# Patient Record
Sex: Male | Born: 1950
Health system: Southern US, Community
[De-identification: ages and names within clinical notes are randomized; demographics above are authoritative.]

## PROBLEM LIST (undated history)

## (undated) DIAGNOSIS — K759 Inflammatory liver disease, unspecified: Secondary | ICD-10-CM

## (undated) DIAGNOSIS — M1990 Unspecified osteoarthritis, unspecified site: Secondary | ICD-10-CM

---

## 2015-06-02 ENCOUNTER — Other Ambulatory Visit (HOSPITAL_COMMUNITY): Payer: Self-pay | Admitting: Orthopaedic Surgery

## 2015-06-14 NOTE — Patient Instructions (Addendum)
Michael Noble  06/14/2015   Your procedure is scheduled on:   06/25/2015    Report to [facility] Main  Entrance take [facility]  elevators to 3rd floor to  Short Stay Center at      1230pm  Call this number if you have problems the morning of surgery [phone]   Remember: ONLY 1 PERSON MAY GO WITH YOU TO SHORT STAY TO GET  READY MORNING OF YOUR SURGERY.  Do not eat food after midnite.  May have clear liquids from 12 midnite until 0800am morning of surgery then nothing by mouth.      Take these medicines the morning of surgery with A SIP OF WATER: none                                You may not have any metal on your body including hair pins and              piercings  Do not wear jewelry, , lotions, powders or perfumes, deodorant          .              Men may shave face and neck.   Do not bring valuables to the hospital. [facility] IS NOT             RESPONSIBLE   FOR VALUABLES.  Contacts, dentures or bridgework may not be worn into surgery.  Leave suitcase in the car. After surgery it may be brought to your room.      Special Instructions: coughing and deep breathing exercises, leg exercises               Please read over the following fact sheets you were given: _____________________________________________________________________             [facility] - Preparing for Surgery Before surgery, you can play an important role.  Because skin is not sterile, your skin needs to be as free of germs as possible.  You can reduce the number of germs on your skin by washing with CHG (chlorahexidine gluconate) soap before surgery.  CHG is an antiseptic cleaner which kills germs and bonds with the skin to continue killing germs even after washing. Please DO NOT use if you have an allergy to CHG or antibacterial soaps.  If your skin becomes reddened/irritated stop using the CHG and inform your nurse when you arrive at Short Stay. Do not shave (including legs and  underarms) for at least 48 hours prior to the first CHG shower.  You may shave your face/neck. Please follow these instructions carefully:  1.  Shower with CHG Soap the night before surgery and the  morning of Surgery.  2.  If you choose to wash your hair, wash your hair first as usual with your  normal  shampoo.  3.  After you shampoo, rinse your hair and body thoroughly to remove the  shampoo.                           4.  Use CHG as you would any other liquid soap.  You can apply chg directly  to the skin and wash  Gently with a scrungie or clean washcloth.  5.  Apply the CHG Soap to your body ONLY FROM THE NECK DOWN.   Do not use on face/ open                           Wound or open sores. Avoid contact with eyes, ears mouth and genitals (private parts).                       Wash face,  Genitals (private parts) with your normal soap.             6.  Wash thoroughly, paying special attention to the area where your surgery  will be performed.  7.  Thoroughly rinse your body with warm water from the neck down.  8.  DO NOT shower/wash with your normal soap after using and rinsing off  the CHG Soap.                9.  Pat yourself dry with a clean towel.            10.  Wear clean pajamas.            11.  Place clean sheets on your bed the night of your first shower and do not  sleep with pets. Day of Surgery : Do not apply any lotions/deodorants the morning of surgery.  Please wear clean clothes to the hospital/surgery center.  FAILURE TO FOLLOW THESE INSTRUCTIONS MAY RESULT IN THE CANCELLATION OF YOUR SURGERY PATIENT SIGNATURE_________________________________  NURSE SIGNATURE__________________________________  ________________________________________________________________________    CLEAR LIQUID DIET   Foods Allowed                                                                     Foods Excluded  Coffee and tea, regular and decaf                              liquids that you cannot  Plain Jell-O in any flavor                                             see through such as: Fruit ices (not with fruit pulp)                                     milk, soups, orange juice  Iced Popsicles                                    All solid food Carbonated beverages, regular and diet                                    Cranberry, grape and apple juices Sports drinks like Gatorade Lightly seasoned clear broth or consume(fat free) Sugar, honey syrup  Sample Menu Breakfast                                Lunch                                     Supper Cranberry juice                    Beef broth                            Chicken broth Jell-O                                     Grape juice                           Apple juice Coffee or tea                        Jell-O                                      Popsicle                                                Coffee or tea                        Coffee or tea  _____________________________________________________________________   WHAT IS A BLOOD TRANSFUSION? Blood Transfusion Information  A transfusion is the replacement of blood or some of its parts. Blood is made up of multiple cells which provide different functions.  Red blood cells carry oxygen and are used for blood loss replacement.  White blood cells fight against infection.  Platelets control bleeding.  Plasma helps clot blood.  Other blood products are available for specialized needs, such as hemophilia or other clotting disorders. BEFORE THE TRANSFUSION  Who gives blood for transfusions?   Healthy volunteers who are fully evaluated to make sure their blood is safe. This is blood bank blood. Transfusion therapy is the safest it has ever been in the practice of medicine. Before blood is taken from a donor, a complete history is taken to make sure that person has no history of diseases nor engages in risky social behavior (examples are  intravenous drug use or sexual activity with multiple partners). The donor's travel history is screened to minimize risk of transmitting infections, such as malaria. The donated blood is tested for signs of infectious diseases, such as HIV and hepatitis. The blood is then tested to be sure it is compatible with you in order to minimize the chance of a transfusion reaction. If you or a relative donates blood, this is often done in anticipation of surgery and is not appropriate for emergency situations. It takes many days to process the donated blood. RISKS AND COMPLICATIONS Although transfusion therapy is very safe and saves many lives, the main dangers of transfusion include:  Getting an infectious disease.  Developing a transfusion reaction. This is an allergic reaction to something in the blood you were given. Every precaution is taken to prevent this. The decision to have a blood transfusion has been considered carefully by your caregiver before blood is given. Blood is not given unless the benefits outweigh the risks. AFTER THE TRANSFUSION  Right after receiving a blood transfusion, you will usually feel much better and more energetic. This is especially true if your red blood cells have gotten low (anemic). The transfusion raises the level of the red blood cells which carry oxygen, and this usually causes an energy increase.  The nurse administering the transfusion will monitor you carefully for complications. HOME CARE INSTRUCTIONS  No special instructions are needed after a transfusion. You may find your energy is better. Speak with your caregiver about any limitations on activity for underlying diseases you may have. SEEK MEDICAL CARE IF:   Your condition is not improving after your transfusion.  You develop redness or irritation at the intravenous (IV) site. SEEK IMMEDIATE MEDICAL CARE IF:  Any of the following symptoms occur over the next 12 hours:  Shaking chills.  You have a  temperature by mouth above 102 F (38.9 C), not controlled by medicine.  Chest, back, or muscle pain.  People around you feel you are not acting correctly or are confused.  Shortness of breath or difficulty breathing.  Dizziness and fainting.  You get a rash or develop hives.  You have a decrease in urine output.  Your urine turns a dark color or changes to pink, red, or brown. Any of the following symptoms occur over the next 10 days:  You have a temperature by mouth above 102 F (38.9 C), not controlled by medicine.  Shortness of breath.  Weakness after normal activity.  The white part of the eye turns yellow (jaundice).  You have a decrease in the amount of urine or are urinating less often.  Your urine turns a dark color or changes to pink, red, or brown. Document Released: 11/03/2000 Document Revised: 01/29/2012 Document Reviewed: 06/22/2008 [facility] Patient Information 2014 [place], [place].  _______________________________________________________________________

## 2015-06-15 ENCOUNTER — Encounter (HOSPITAL_COMMUNITY): Payer: Self-pay

## 2015-06-15 ENCOUNTER — Encounter (HOSPITAL_COMMUNITY)
Admission: RE | Admit: 2015-06-15 | Discharge: 2015-06-15 | Disposition: A | Discharge status: Home / Self Care | Payer: BLUE CROSS/BLUE SHIELD | Source: Ambulatory Visit | Attending: Orthopaedic Surgery | Admitting: Orthopaedic Surgery

## 2015-06-15 DIAGNOSIS — M1612 Unilateral primary osteoarthritis, left hip: Secondary | ICD-10-CM | POA: Insufficient documentation

## 2015-06-15 DIAGNOSIS — Z01812 Encounter for preprocedural laboratory examination: Secondary | ICD-10-CM | POA: Diagnosis not present

## 2015-06-15 HISTORY — DX: Inflammatory liver disease, unspecified: K75.9

## 2015-06-15 HISTORY — DX: Unspecified osteoarthritis, unspecified site: M19.90

## 2015-06-15 LAB — BASIC METABOLIC PANEL
Anion gap: 6 (ref 5–15)
BUN: 25 mg/dL — AB (ref 6–20)
CHLORIDE: 104 mmol/L (ref 101–111)
CO2: 28 mmol/L (ref 22–32)
Calcium: 9.4 mg/dL (ref 8.9–10.3)
Creatinine, Ser: 0.84 mg/dL (ref 0.61–1.24)
GFR calc Af Amer: >60 mL/min (ref >60)
GLUCOSE: 115 mg/dL — AB (ref 65–99)
Potassium: 4.6 mmol/L (ref 3.5–5.1)
Sodium: 138 mmol/L (ref 135–145)

## 2015-06-15 LAB — HEPATIC FUNCTION PANEL
ALK PHOS: 85 U/L (ref 38–126)
ALT: 24 U/L (ref 17–63)
AST: 30 U/L (ref 15–41)
Albumin: 4.3 g/dL (ref 3.5–5.0)
BILIRUBIN TOTAL: 0.7 mg/dL (ref 0.3–1.2)
Bilirubin, Direct: <0.1 mg/dL — ABNORMAL LOW (ref 0.1–0.5)
Total Protein: 7.7 g/dL (ref 6.5–8.1)

## 2015-06-15 LAB — CBC
HEMATOCRIT: 42.2 % (ref 39.0–52.0)
Hemoglobin: 14.6 g/dL (ref 13.0–17.0)
MCH: 32 pg (ref 26.0–34.0)
MCHC: 34.6 g/dL (ref 30.0–36.0)
MCV: 92.5 fL (ref 78.0–100.0)
Platelets: 181 10*3/uL (ref 150–400)
RBC: 4.56 MIL/uL (ref 4.22–5.81)
RDW: 12.5 % (ref 11.5–15.5)
WBC: 5.4 10*3/uL (ref 4.0–10.5)

## 2015-06-15 LAB — APTT: APTT: 27 s (ref 24–37)

## 2015-06-15 LAB — PROTIME-INR
INR: 1.06 (ref 0.00–1.49)
Prothrombin Time: 14 seconds (ref 11.6–15.2)

## 2015-06-15 LAB — ABO/RH: ABO/RH(D): A POS

## 2015-06-15 LAB — SURGICAL PCR SCREEN
MRSA, PCR: NEGATIVE
STAPHYLOCOCCUS AUREUS: NEGATIVE

## 2015-06-15 NOTE — Progress Notes (Signed)
BMP results done 06/15/15 faxed via EPIC to Dr Maureen Ralphs.

## 2015-06-24 LAB — TYPE AND SCREEN
ABO/RH(D): A POS
Antibody Screen: NEGATIVE

## 2015-06-25 ENCOUNTER — Encounter (HOSPITAL_COMMUNITY): Payer: Self-pay | Admitting: *Deleted

## 2015-06-25 ENCOUNTER — Inpatient Hospital Stay (HOSPITAL_COMMUNITY): Payer: BLUE CROSS/BLUE SHIELD

## 2015-06-25 ENCOUNTER — Encounter (HOSPITAL_COMMUNITY)
Admission: RE | Disposition: A | Discharge status: Home Health | Payer: Self-pay | Source: Ambulatory Visit | Attending: Orthopaedic Surgery

## 2015-06-25 ENCOUNTER — Inpatient Hospital Stay (HOSPITAL_COMMUNITY): Payer: BLUE CROSS/BLUE SHIELD | Admitting: Certified Registered Nurse Anesthetist

## 2015-06-25 ENCOUNTER — Inpatient Hospital Stay (HOSPITAL_COMMUNITY)
Admission: RE | Admit: 2015-06-25 | Discharge: 2015-06-26 | DRG: 470 | Disposition: A | Discharge status: Home Health | Payer: BLUE CROSS/BLUE SHIELD | Source: Ambulatory Visit | Attending: Orthopaedic Surgery | Admitting: Orthopaedic Surgery

## 2015-06-25 DIAGNOSIS — Z87891 Personal history of nicotine dependence: Secondary | ICD-10-CM

## 2015-06-25 DIAGNOSIS — Z96642 Presence of left artificial hip joint: Secondary | ICD-10-CM

## 2015-06-25 DIAGNOSIS — Z01812 Encounter for preprocedural laboratory examination: Secondary | ICD-10-CM | POA: Diagnosis not present

## 2015-06-25 DIAGNOSIS — M1612 Unilateral primary osteoarthritis, left hip: Principal | ICD-10-CM | POA: Diagnosis present

## 2015-06-25 DIAGNOSIS — M25551 Pain in right hip: Secondary | ICD-10-CM

## 2015-06-25 DIAGNOSIS — M25552 Pain in left hip: Secondary | ICD-10-CM | POA: Diagnosis present

## 2015-06-25 HISTORY — PX: TOTAL HIP ARTHROPLASTY: SHX124

## 2015-06-25 SURGERY — ARTHROPLASTY, HIP, TOTAL, ANTERIOR APPROACH
Anesthesia: Spinal | Site: Hip | Laterality: Left

## 2015-06-25 MED ORDER — ZOLPIDEM TARTRATE 5 MG PO TABS: 5.0000 mg | ORAL_TABLET | Freq: Every evening | ORAL | Status: DC | PRN

## 2015-06-25 MED ORDER — HYDROMORPHONE HCL 1 MG/ML IJ SOLN
1.0000 mg | INTRAMUSCULAR | Status: DC | PRN
  Filled 2015-06-25: qty 1

## 2015-06-25 MED ORDER — PROPOFOL 10 MG/ML IV BOLUS
INTRAVENOUS | Status: AC
  Filled 2015-06-25: qty 20

## 2015-06-25 MED ORDER — PROMETHAZINE HCL 25 MG/ML IJ SOLN
6.2500 mg | INTRAMUSCULAR | Status: DC | PRN
Start: 2015-06-25 — End: 2015-06-25

## 2015-06-25 MED ORDER — DIPHENHYDRAMINE HCL 12.5 MG/5ML PO ELIX: 12.5000 mg | ORAL_SOLUTION | ORAL | Status: DC | PRN

## 2015-06-25 MED ORDER — ALUM & MAG HYDROXIDE-SIMETH 200-200-20 MG/5ML PO SUSP: 30.0000 mL | ORAL | Status: DC | PRN

## 2015-06-25 MED ORDER — PHENYLEPHRINE HCL 10 MG/ML IJ SOLN
10.0000 mg | INTRAVENOUS | Status: DC | PRN
  Administered 2015-06-25: 30 ug/min via INTRAVENOUS

## 2015-06-25 MED ORDER — OXYCODONE HCL 5 MG PO TABS: 5.0000 mg | ORAL_TABLET | ORAL | Status: DC | PRN

## 2015-06-25 MED ORDER — BUPIVACAINE HCL (PF) 0.5 % IJ SOLN
INTRAMUSCULAR | Status: DC | PRN
Start: 2015-06-25 — End: 2015-06-25
  Administered 2015-06-25: 3 mL

## 2015-06-25 MED ORDER — SODIUM CHLORIDE 0.9 % IV SOLN
INTRAVENOUS | Status: DC
  Administered 2015-06-25: 20:00:00 via INTRAVENOUS

## 2015-06-25 MED ORDER — FENTANYL CITRATE (PF) 100 MCG/2ML IJ SOLN
INTRAMUSCULAR | Status: AC
  Filled 2015-06-25: qty 4

## 2015-06-25 MED ORDER — ONDANSETRON HCL 4 MG PO TABS: 4.0000 mg | ORAL_TABLET | Freq: Four times a day (QID) | ORAL | Status: DC | PRN

## 2015-06-25 MED ORDER — METOCLOPRAMIDE HCL 10 MG PO TABS: 5.0000 mg | ORAL_TABLET | Freq: Three times a day (TID) | ORAL | Status: DC | PRN

## 2015-06-25 MED ORDER — LACTATED RINGERS IV SOLN
INTRAVENOUS | Status: DC
  Administered 2015-06-25: 1000 mL via INTRAVENOUS
  Administered 2015-06-25 (×2): via INTRAVENOUS

## 2015-06-25 MED ORDER — CEFAZOLIN SODIUM-DEXTROSE 2-3 GM-% IV SOLR
INTRAVENOUS | Status: AC
  Filled 2015-06-25: qty 50

## 2015-06-25 MED ORDER — PROPOFOL 500 MG/50ML IV EMUL
INTRAVENOUS | Status: DC | PRN
  Administered 2015-06-25: 40 mg via INTRAVENOUS
  Administered 2015-06-25: 30 mg via INTRAVENOUS

## 2015-06-25 MED ORDER — TRANEXAMIC ACID 1000 MG/10ML IV SOLN
1000.0000 mg | INTRAVENOUS | Status: AC
  Administered 2015-06-25: 1000 mg via INTRAVENOUS
  Filled 2015-06-25: qty 10

## 2015-06-25 MED ORDER — FENTANYL CITRATE (PF) 100 MCG/2ML IJ SOLN: 25.0000 ug | INTRAMUSCULAR | Status: DC | PRN

## 2015-06-25 MED ORDER — DOCUSATE SODIUM 100 MG PO CAPS
100.0000 mg | ORAL_CAPSULE | Freq: Two times a day (BID) | ORAL | Status: DC
  Administered 2015-06-25 – 2015-06-26 (×2): 100 mg via ORAL

## 2015-06-25 MED ORDER — DEXTROSE 5 % IV SOLN
500.0000 mg | Freq: Four times a day (QID) | INTRAVENOUS | Status: DC | PRN
  Filled 2015-06-25: qty 5

## 2015-06-25 MED ORDER — ACETAMINOPHEN 650 MG RE SUPP: 650.0000 mg | Freq: Four times a day (QID) | RECTAL | Status: DC | PRN

## 2015-06-25 MED ORDER — MIDAZOLAM HCL 5 MG/5ML IJ SOLN
INTRAMUSCULAR | Status: DC | PRN
  Administered 2015-06-25: 2 mg via INTRAVENOUS

## 2015-06-25 MED ORDER — LACTATED RINGERS IV SOLN: INTRAVENOUS | Status: DC

## 2015-06-25 MED ORDER — PHENYLEPHRINE HCL 10 MG/ML IJ SOLN
INTRAMUSCULAR | Status: DC | PRN
  Administered 2015-06-25 (×4): 80 ug via INTRAVENOUS

## 2015-06-25 MED ORDER — PHENYLEPHRINE 40 MCG/ML (10ML) SYRINGE FOR IV PUSH (FOR BLOOD PRESSURE SUPPORT)
PREFILLED_SYRINGE | INTRAVENOUS | Status: AC
  Filled 2015-06-25: qty 20

## 2015-06-25 MED ORDER — FENTANYL CITRATE (PF) 100 MCG/2ML IJ SOLN
INTRAMUSCULAR | Status: DC | PRN
  Administered 2015-06-25: 100 ug via INTRAVENOUS

## 2015-06-25 MED ORDER — METOCLOPRAMIDE HCL 5 MG/ML IJ SOLN: 5.0000 mg | Freq: Three times a day (TID) | INTRAMUSCULAR | Status: DC | PRN

## 2015-06-25 MED ORDER — ASPIRIN EC 325 MG PO TBEC
325.0000 mg | DELAYED_RELEASE_TABLET | Freq: Two times a day (BID) | ORAL | Status: DC
  Administered 2015-06-25 – 2015-06-26 (×2): 325 mg via ORAL
  Filled 2015-06-25 (×4): qty 1

## 2015-06-25 MED ORDER — CEFAZOLIN SODIUM-DEXTROSE 2-3 GM-% IV SOLR
2.0000 g | INTRAVENOUS | Status: AC
  Administered 2015-06-25: 2 g via INTRAVENOUS

## 2015-06-25 MED ORDER — ONDANSETRON HCL 4 MG/2ML IJ SOLN: 4.0000 mg | Freq: Four times a day (QID) | INTRAMUSCULAR | Status: DC | PRN

## 2015-06-25 MED ORDER — METHOCARBAMOL 500 MG PO TABS: 500.0000 mg | ORAL_TABLET | Freq: Four times a day (QID) | ORAL | Status: DC | PRN

## 2015-06-25 MED ORDER — MENTHOL 3 MG MT LOZG: 1.0000 | LOZENGE | OROMUCOSAL | Status: DC | PRN

## 2015-06-25 MED ORDER — PROPOFOL INFUSION 10 MG/ML OPTIME
INTRAVENOUS | Status: DC | PRN
  Administered 2015-06-25: 75 ug/kg/min via INTRAVENOUS

## 2015-06-25 MED ORDER — MIDAZOLAM HCL 2 MG/2ML IJ SOLN
INTRAMUSCULAR | Status: AC
  Filled 2015-06-25: qty 4

## 2015-06-25 MED ORDER — PROPOFOL 10 MG/ML IV BOLUS
INTRAVENOUS | Status: AC
Start: 2015-06-25 — End: 2015-06-25
  Filled 2015-06-25: qty 20

## 2015-06-25 MED ORDER — MEPERIDINE HCL 50 MG/ML IJ SOLN: 6.2500 mg | INTRAMUSCULAR | Status: DC | PRN

## 2015-06-25 MED ORDER — PHENOL 1.4 % MT LIQD
1.0000 | OROMUCOSAL | Status: DC | PRN
  Filled 2015-06-25: qty 177

## 2015-06-25 MED ORDER — BUPIVACAINE HCL (PF) 0.5 % IJ SOLN
INTRAMUSCULAR | Status: AC
  Filled 2015-06-25: qty 30

## 2015-06-25 MED ORDER — CEFAZOLIN SODIUM 1-5 GM-% IV SOLN
1.0000 g | Freq: Four times a day (QID) | INTRAVENOUS | Status: AC
  Administered 2015-06-25 – 2015-06-26 (×2): 1 g via INTRAVENOUS
  Filled 2015-06-25 (×2): qty 50

## 2015-06-25 MED ORDER — ACETAMINOPHEN 325 MG PO TABS
650.0000 mg | ORAL_TABLET | Freq: Four times a day (QID) | ORAL | Status: DC | PRN
  Administered 2015-06-26: 650 mg via ORAL
  Filled 2015-06-25: qty 2

## 2015-06-25 SURGICAL SUPPLY — 43 items
BAG ZIPLOCK 12X15 (MISCELLANEOUS) IMPLANT
BENZOIN TINCTURE PRP APPL 2/3 (GAUZE/BANDAGES/DRESSINGS) IMPLANT
BLADE SAW SGTL 18X1.27X75 (BLADE) ×2 IMPLANT
BLADE SAW SGTL 18X1.27X75MM (BLADE) ×1
CAPT HIP TOTAL 2 ×3 IMPLANT
CELLS DAT CNTRL 66122 CELL SVR (MISCELLANEOUS) ×1 IMPLANT
CLOSURE WOUND 1/2 X4 (GAUZE/BANDAGES/DRESSINGS)
COVER PERINEAL POST (MISCELLANEOUS) ×3 IMPLANT
DRAPE C-ARM 42X120 X-RAY (DRAPES) ×3 IMPLANT
DRAPE STERI IOBAN 125X83 (DRAPES) ×3 IMPLANT
DRAPE U-SHAPE 47X51 STRL (DRAPES) ×9 IMPLANT
DRSG AQUACEL AG ADV 3.5X10 (GAUZE/BANDAGES/DRESSINGS) ×3 IMPLANT
DRSG XEROFORM 1X8 (GAUZE/BANDAGES/DRESSINGS) ×3 IMPLANT
DURAPREP 26ML APPLICATOR (WOUND CARE) ×3 IMPLANT
ELECT BLADE TIP CTD 4 INCH (ELECTRODE) ×3 IMPLANT
ELECT REM PT RETURN 9FT ADLT (ELECTROSURGICAL) ×3
ELECTRODE REM PT RTRN 9FT ADLT (ELECTROSURGICAL) ×1 IMPLANT
FACESHIELD WRAPAROUND (MASK) ×12 IMPLANT
GAUZE XEROFORM 1X8 LF (GAUZE/BANDAGES/DRESSINGS) IMPLANT
GLOVE BIO SURGEON STRL SZ7.5 (GLOVE) ×3 IMPLANT
GLOVE BIOGEL PI IND STRL 8 (GLOVE) ×2 IMPLANT
GLOVE BIOGEL PI INDICATOR 8 (GLOVE) ×4
GLOVE ECLIPSE 8.0 STRL XLNG CF (GLOVE) ×3 IMPLANT
GOWN STRL REUS W/TWL XL LVL3 (GOWN DISPOSABLE) ×6 IMPLANT
HANDPIECE INTERPULSE COAX TIP (DISPOSABLE) ×2
KIT BASIN OR (CUSTOM PROCEDURE TRAY) ×3 IMPLANT
PACK TOTAL JOINT (CUSTOM PROCEDURE TRAY) ×3 IMPLANT
PEN SKIN MARKING BROAD (MISCELLANEOUS) ×3 IMPLANT
RTRCTR WOUND ALEXIS 18CM MED (MISCELLANEOUS) ×3
SET HNDPC FAN SPRY TIP SCT (DISPOSABLE) ×1 IMPLANT
STAPLER VISISTAT 35W (STAPLE) IMPLANT
STRIP CLOSURE SKIN 1/2X4 (GAUZE/BANDAGES/DRESSINGS) IMPLANT
SUT ETHIBOND NAB CT1 #1 30IN (SUTURE) ×3 IMPLANT
SUT MNCRL AB 4-0 PS2 18 (SUTURE) IMPLANT
SUT VIC AB 0 CT1 36 (SUTURE) ×3 IMPLANT
SUT VIC AB 1 CT1 36 (SUTURE) ×3 IMPLANT
SUT VIC AB 2-0 CT1 27 (SUTURE) ×4
SUT VIC AB 2-0 CT1 TAPERPNT 27 (SUTURE) ×2 IMPLANT
TOWEL OR 17X26 10 PK STRL BLUE (TOWEL DISPOSABLE) ×3 IMPLANT
TOWEL OR NON WOVEN STRL DISP B (DISPOSABLE) ×3 IMPLANT
TRAY FOLEY CATH 16FRSI W/METER (SET/KITS/TRAYS/PACK) ×3 IMPLANT
TRAY FOLEY W/METER SILVER 14FR (SET/KITS/TRAYS/PACK) IMPLANT
YANKAUER SUCT BULB TIP 10FT TU (MISCELLANEOUS) ×3 IMPLANT

## 2015-06-25 NOTE — Anesthesia Postprocedure Evaluation (Signed)
  Anesthesia Post-op Note  Patient: Michael Noble  Procedure(s) Performed: Procedure(s) (LRB): LEFT TOTAL HIP ARTHROPLASTY ANTERIOR APPROACH (Left)  Patient Location: PACU  Anesthesia Type: Spinal  Level of Consciousness: awake and alert   Airway and Oxygen Therapy: Patient Spontanous Breathing  Post-op Pain: mild  Post-op Assessment: Post-op Vital signs reviewed, Patient's Cardiovascular Status Stable, Respiratory Function Stable, Patent Airway and No signs of Nausea or vomiting  Last Vitals:  Filed Vitals:   06/25/15 1643  BP: 116/80  Pulse:   Temp: 36.5 C  Resp: 16    Post-op Vital Signs: stable   Complications: No apparent anesthesia complications

## 2015-06-25 NOTE — Transfer of Care (Signed)
Immediate Anesthesia Transfer of Care Note  Patient: Michael Noble  Procedure(s) Performed: Procedure(s): LEFT TOTAL HIP ARTHROPLASTY ANTERIOR APPROACH (Left)  Patient Location: PACU  Anesthesia Type:Spinal  Level of Consciousness: sedated, patient cooperative and responds to stimulation  Airway & Oxygen Therapy: Patient Spontanous Breathing and Patient connected to face mask oxygen  Post-op Assessment: Report given to RN and Post -op Vital signs reviewed and stable  Post vital signs: Reviewed and stable  Last Vitals:  Filed Vitals:   06/25/15 0948  BP: 153/87  Pulse: 88  Temp: 36.4 C  Resp: 16    Complications: No apparent anesthesia complications

## 2015-06-25 NOTE — Anesthesia Procedure Notes (Signed)
Spinal  Start time: 06/25/2015 12:54 PM End time: 06/25/2015 1:00 PM Staffing Anesthesiologist: Phillips Grout Performed by: anesthesiologist  Preanesthetic Checklist Completed: patient identified, site marked, surgical consent, pre-op evaluation, timeout performed, IV checked, risks and benefits discussed and monitors and equipment checked Spinal Block Patient position: sitting Prep: Betadine Patient monitoring: heart rate, continuous pulse ox and blood pressure Approach: midline Location: L3-4 Injection technique: single-shot Needle Needle type: Sprotte  Needle gauge: 24 G Needle length: 9 cm Needle insertion depth: 7 cm Additional Notes Pt in sitting postion tolerated well without parasthesia.

## 2015-06-25 NOTE — H&P (Signed)
TOTAL HIP ADMISSION H&P  Patient is admitted for left total hip arthroplasty.  Subjective:  Chief Complaint: left hip pain  HPI: Michael Noble, 64 y.o. male, has a history of pain and functional disability in the left hip(s) due to arthritis and patient has failed non-surgical conservative treatments for greater than 12 weeks to include NSAID's and/or analgesics and activity modification.  Onset of symptoms was gradual starting 5 years ago with gradually worsening course since that time.The patient noted no past surgery on the left hip(s).  Patient currently rates pain in the left hip at 8 out of 10 with activity. Patient has night pain, worsening of pain with activity and weight bearing, trendelenberg gait, pain that interfers with activities of daily living and pain with passive range of motion. Patient has evidence of subchondral cysts, subchondral sclerosis, periarticular osteophytes, joint subluxation and joint space narrowing by imaging studies. This condition presents safety issues increasing the risk of falls.  There is no current active infection.  Patient Active Problem List   Diagnosis Date Noted  . Osteoarthritis of left hip 06/25/2015   Past Medical History  Diagnosis Date  . Arthritis   . Hepatitis     hx of 35 years ago     No past surgical history on file.  No prescriptions prior to admission   No Known Allergies  History  Substance Use Topics  . Smoking status: Former Games developer  . Smokeless tobacco: Current User    Types: Chew  . Alcohol Use: No    No family history on file.   Review of Systems  Musculoskeletal: Positive for back pain and joint pain.  All other systems reviewed and are negative.   Objective:  Physical Exam  Constitutional: He is oriented to person, place, and time. He appears well-developed and well-nourished.  HENT:  Head: Normocephalic and atraumatic.  Eyes: EOM are normal. Pupils are equal, round, and reactive to light.  Neck: Normal  range of motion. Neck supple.  Cardiovascular: Normal rate and regular rhythm.   Respiratory: Effort normal and breath sounds normal.  GI: Soft. Bowel sounds are normal.  Musculoskeletal:       Left hip: He exhibits decreased range of motion, decreased strength, tenderness, bony tenderness, crepitus and deformity.  Neurological: He is alert and oriented to person, place, and time.  Skin: Skin is warm and dry.  Psychiatric: He has a normal mood and affect.    Vital signs in last 24 hours:    Labs:   There is no height or weight on file to calculate BMI.   Imaging Review Plain radiographs demonstrate severe degenerative joint disease of the left hip(s). The bone quality appears to be good for age and reported activity level.  Assessment/Plan:  End stage arthritis, left hip(s)  The patient history, physical examination, clinical judgement of the provider and imaging studies are consistent with end stage degenerative joint disease of the left hip(s) and total hip arthroplasty is deemed medically necessary. The treatment options including medical management, injection therapy, arthroscopy and arthroplasty were discussed at length. The risks and benefits of total hip arthroplasty were presented and reviewed. The risks due to aseptic loosening, infection, stiffness, dislocation/subluxation,  thromboembolic complications and other imponderables were discussed.  The patient acknowledged the explanation, agreed to proceed with the plan and consent was signed. Patient is being admitted for inpatient treatment for surgery, pain control, PT, OT, prophylactic antibiotics, VTE prophylaxis, progressive ambulation and ADL's and discharge planning.The patient is planning to be  discharged home with home health services

## 2015-06-25 NOTE — Brief Op Note (Signed)
06/25/2015  2:27 PM  PATIENT:  Hulen Shouts  64 y.o. male  PRE-OPERATIVE DIAGNOSIS:  severe osteoarthritis left hip  POST-OPERATIVE DIAGNOSIS:  severe osteoarthritis left hip  PROCEDURE:  Procedure(s): LEFT TOTAL HIP ARTHROPLASTY ANTERIOR APPROACH (Left)  SURGEON:  Surgeon(s) and Role:    * Kathryne Hitch, MD - Primary  PHYSICIAN ASSISTANT: Rexene Edison, PA-C  ANESTHESIA:   spinal  EBL:  Total I/O In: 1000 [I.V.:1000] Out: 500 [Urine:100; Blood:400]  BLOOD ADMINISTERED:none  DRAINS: none   LOCAL MEDICATIONS USED:  NONE  SPECIMEN:  No Specimen  DISPOSITION OF SPECIMEN:  N/A  COUNTS:  YES  TOURNIQUET:  * No tourniquets in log *  DICTATION: .Other Dictation: Dictation Number [id number]  PLAN OF CARE: Admit to inpatient   PATIENT DISPOSITION:  PACU - hemodynamically stable.   Delay start of Pharmacological VTE agent (>24hrs) due to surgical blood loss or risk of bleeding: no

## 2015-06-25 NOTE — Anesthesia Preprocedure Evaluation (Addendum)
Anesthesia Evaluation  Patient identified by MRN, date of birth, ID band Patient awake    Reviewed: Allergy & Precautions, NPO status , Patient's Chart, lab work & pertinent test results  Airway Mallampati: II  TM Distance: >3 FB Neck ROM: Full    Dental no notable dental hx.    Pulmonary former smoker,  breath sounds clear to auscultation  Pulmonary exam normal       Cardiovascular negative cardio ROS Normal cardiovascular examRhythm:Regular Rate:Normal     Neuro/Psych negative neurological ROS  negative psych ROS   GI/Hepatic negative GI ROS, Neg liver ROS,   Endo/Other  negative endocrine ROS  Renal/GU negative Renal ROS  negative genitourinary   Musculoskeletal negative musculoskeletal ROS (+)   Abdominal   Peds negative pediatric ROS (+)  Hematology negative hematology ROS (+)   Anesthesia Other Findings   Reproductive/Obstetrics negative OB ROS                             Anesthesia Physical Anesthesia Plan  ASA: II  Anesthesia Plan: Spinal   Post-op Pain Management:    Induction:   Airway Management Planned: Simple Face Mask  Additional Equipment:   Intra-op Plan:   Post-operative Plan:   Informed Consent: I have reviewed the patients History and Physical, chart, labs and discussed the procedure including the risks, benefits and alternatives for the proposed anesthesia with the patient or authorized representative who has indicated his/her understanding and acceptance.   Dental advisory given  Plan Discussed with: CRNA  Anesthesia Plan Comments:         Anesthesia Quick Evaluation  

## 2015-06-26 LAB — CBC
HCT: 34.5 % — ABNORMAL LOW (ref 39.0–52.0)
HEMOGLOBIN: 11.5 g/dL — AB (ref 13.0–17.0)
MCH: 30.7 pg (ref 26.0–34.0)
MCHC: 33.3 g/dL (ref 30.0–36.0)
MCV: 92 fL (ref 78.0–100.0)
PLATELETS: 137 10*3/uL — AB (ref 150–400)
RBC: 3.75 MIL/uL — ABNORMAL LOW (ref 4.22–5.81)
RDW: 12.3 % (ref 11.5–15.5)
WBC: 10.2 10*3/uL (ref 4.0–10.5)

## 2015-06-26 LAB — BASIC METABOLIC PANEL
ANION GAP: 6 (ref 5–15)
BUN: 14 mg/dL (ref 6–20)
CALCIUM: 8.4 mg/dL — AB (ref 8.9–10.3)
CO2: 25 mmol/L (ref 22–32)
Chloride: 103 mmol/L (ref 101–111)
Creatinine, Ser: 0.8 mg/dL (ref 0.61–1.24)
GFR calc Af Amer: >60 mL/min (ref >60)
GFR calc non Af Amer: >60 mL/min (ref >60)
Glucose, Bld: 113 mg/dL — ABNORMAL HIGH (ref 65–99)
Potassium: 3.9 mmol/L (ref 3.5–5.1)
SODIUM: 134 mmol/L — AB (ref 135–145)

## 2015-06-26 MED ORDER — METHOCARBAMOL 500 MG PO TABS: 500.0000 mg | ORAL_TABLET | Freq: Four times a day (QID) | ORAL | Status: DC | PRN

## 2015-06-26 MED ORDER — ASPIRIN 325 MG PO TBEC: 325.0000 mg | DELAYED_RELEASE_TABLET | Freq: Two times a day (BID) | ORAL | Status: DC

## 2015-06-26 MED ORDER — OXYCODONE-ACETAMINOPHEN 5-325 MG PO TABS: 1.0000 | ORAL_TABLET | ORAL | Status: DC | PRN

## 2015-06-26 NOTE — Evaluation (Signed)
Physical Therapy Evaluation Patient Details Name: Michael Noble MRN: [id number] DOB: [birth year] Today's Date: 06/26/2015   History of Present Illness  L DATHA  Clinical Impression  Patient is up ad lib, plans DC later today. No furthe r PT needs on acute as patient is leaving.    Follow Up Recommendations Home health PT;Supervision - Intermittent    Equipment Recommendations  None recommended by PT    Recommendations for Other Services       Precautions / Restrictions Precautions Precautions: Fall      Mobility  Bed Mobility   Bed Mobility: Supine to Sit;Sit to Supine     Supine to sit: Modified independent (Device/Increase time) Sit to supine: Modified independent (Device/Increase time)   General bed mobility comments: uses hands to lift leg, instructed in use of sheet or leg lifter to assist LLE onto bed.  Transfers Overall transfer level: Needs assistance Equipment used: Rolling walker (2 wheeled) Transfers: Sit to/from Stand Sit to Stand: Modified independent (Device/Increase time)         General transfer comment: cues for safety  Ambulation/Gait Ambulation/Gait assistance: Modified independent (Device/Increase time) Ambulation Distance (Feet): 300 Feet Assistive device: Rolling walker (2 wheeled) Gait Pattern/deviations: Step-through pattern;Decreased step length - left        Stairs Stairs: Yes Stairs assistance: Modified independent (Device/Increase time) Stair Management: Forwards;With walker Number of Stairs: 1    Wheelchair Mobility    Modified Rankin (Stroke Patients Only)       Balance                                             Pertinent Vitals/Pain Pain Score: 2  Pain Location: l thigh Pain Descriptors / Indicators: Tender;Tightness Pain Intervention(s): Monitored during session    Home Living Family/patient expects to be discharged to:: Private residence Living Arrangements: Spouse/significant  other Available Help at Discharge: Family   Home Access: Stairs to enter Entrance Stairs-Rails: None Secretary/administrator of Steps: 1 Home Layout: One level Home Equipment: Walker - standard;Cane - single point      Prior Function Level of Independence: Independent               Hand Dominance        Extremity/Trunk Assessment                   LLE Deficits / Details: hip tends to externally rotate at rest and during swing     Communication      Cognition Arousal/Alertness: Awake/alert Behavior During Therapy: WFL for tasks assessed/performed Overall Cognitive Status: Within Functional Limits for tasks assessed                      General Comments      Exercises Total Joint Exercises Quad Sets: AROM;Both;10 reps Short Arc Quad: AROM;Left;10 reps Heel Slides: AROM;AAROM;Left;10 reps Hip ABduction/ADduction: AAROM;Left;10 reps Long Arc Quad: AROM;Left;10 reps      Assessment/Plan    PT Assessment Patient needs continued PT services  PT Diagnosis Difficulty walking   PT Problem List Decreased strength;Decreased range of motion;Decreased activity tolerance;Decreased knowledge of use of DME;Decreased safety awareness;Decreased knowledge of precautions;Decreased mobility  PT Treatment Interventions DME instruction;Gait training;Functional mobility training;Therapeutic activities;Therapeutic exercise   PT Goals (Current goals can be found in the Care Plan section) Acute Rehab PT Goals Patient Stated Goal: to walk  normally PT Goal Formulation: With patient Time For Goal Achievement: 06/27/15 Potential to Achieve Goals: Good    Frequency 7X/week   Barriers to discharge        Co-evaluation               End of Session   Activity Tolerance: Patient tolerated treatment well Patient left: in chair Nurse Communication: Mobility status         Time: 0930-1000 PT Time Calculation (min) (ACUTE ONLY): 30 min   Charges:   PT  Evaluation $Initial PT Evaluation Tier I: 1 Procedure PT Treatments $Gait Training: 8-22 mins   PT G Codes:        Rada Hay 06/26/2015, 3:52 PM

## 2015-06-26 NOTE — Progress Notes (Signed)
Subjective: 1 Day Post-Op Procedure(s) (LRB): LEFT TOTAL HIP ARTHROPLASTY ANTERIOR APPROACH (Left) Patient reports pain as moderate.  Wants to be able to go home later this afternoon if clears therapy.  Objective: Vital signs in last 24 hours: Temp:  [97.3 F (36.3 C)-98.6 F (37 C)] 98.3 F (36.8 C) (08/06 0515) Pulse Rate:  [55-102] 102 (08/06 0515) Resp:  [12-18] 16 (08/06 0515) BP: (92-153)/(50-87) 123/60 mmHg (08/06 0515) SpO2:  [97 %-100 %] 100 % (08/06 0515) Weight:  [74.844 kg (165 lb)] 74.844 kg (165 lb) (08/05 0949)  Intake/Output from previous day: 08/05 0701 - 08/06 0700 In: 3928.8 [P.O.:510; I.V.:3368.8; IV Piggyback:50] Out: 2350 [Urine:1950; Blood:400] Intake/Output this shift:     Recent Labs  06/26/15 0430  HGB 11.5*    Recent Labs  06/26/15 0430  WBC 10.2  RBC 3.75*  HCT 34.5*  PLT 137*    Recent Labs  06/26/15 0430  NA 134*  K 3.9  CL 103  CO2 25  BUN 14  CREATININE 0.80  GLUCOSE 113*  CALCIUM 8.4*   No results for input(s): LABPT, INR in the last 72 hours.  Sensation intact distally Intact pulses distally Dorsiflexion/Plantar flexion intact Incision: scant drainage  Assessment/Plan: 1 Day Post-Op Procedure(s) (LRB): LEFT TOTAL HIP ARTHROPLASTY ANTERIOR APPROACH (Left) Up with therapy Discharge home with home health this afternoon vs tomorrow.  Michael Noble 06/26/2015, 7:14 AM

## 2015-06-26 NOTE — Discharge Summary (Signed)
Patient ID: Tylor Courtwright MRN: [id number] DOB/AGE: 02-27-1951 64 y.o.  Admit date: 06/25/2015 Discharge date: 06/26/2015  Admission Diagnoses:  Principal Problem:   Osteoarthritis of left hip Active Problems:   Status post total replacement of left hip   Discharge Diagnoses:  Same  Past Medical History  Diagnosis Date  . Arthritis   . Hepatitis     hx of 35 years ago     Surgeries: Procedure(s): LEFT TOTAL HIP ARTHROPLASTY ANTERIOR APPROACH on 06/25/2015   Consultants:    Discharged Condition: Improved  Hospital Course: Jaryd Drew is an 64 y.o. male who was admitted 06/25/2015 for operative treatment ofOsteoarthritis of left hip. Patient has severe unremitting pain that affects sleep, daily activities, and work/hobbies. After pre-op clearance the patient was taken to the operating room on 06/25/2015 and underwent  Procedure(s): LEFT TOTAL HIP ARTHROPLASTY ANTERIOR APPROACH.    Patient was given perioperative antibiotics: Anti-infectives    Start     Dose/Rate Route Frequency Ordered Stop   06/25/15 1800  ceFAZolin (ANCEF) IVPB 1 g/50 mL premix     1 g 100 mL/hr over 30 Minutes Intravenous Every 6 hours 06/25/15 1709 06/26/15 0059   06/25/15 0945  ceFAZolin (ANCEF) IVPB 2 g/50 mL premix     2 g 100 mL/hr over 30 Minutes Intravenous On call to O.R. 06/25/15 0945 06/25/15 1334       Patient was given sequential compression devices, early ambulation, and chemoprophylaxis to prevent DVT.  Patient benefited maximally from hospital stay and there were no complications.    Recent vital signs: Patient Vitals for the past 24 hrs:  BP Temp Temp src Pulse Resp SpO2 Height Weight  06/26/15 0515 123/60 mmHg 98.3 F (36.8 C) Oral (!) 102 16 100 % - -  06/25/15 2100 (!) 121/50 mmHg 98.2 F (36.8 C) Oral 80 16 100 % - -  06/25/15 1953 124/69 mmHg 98.4 F (36.9 C) Oral 84 16 100 % - -  06/25/15 1845 (!) 148/72 mmHg 98.6 F (37 C) Oral 84 16 100 % - -  06/25/15 1745 130/76 mmHg  97.8 F (36.6 C) Oral 70 18 100 % - -  06/25/15 1643 116/80 mmHg 97.7 F (36.5 C) - - 16 100 % - -  06/25/15 1630 125/74 mmHg 97.3 F (36.3 C) - (!) 59 13 100 % - -  06/25/15 1615 110/68 mmHg - - (!) 55 12 100 % - -  06/25/15 1600 114/62 mmHg - - (!) 55 16 100 % - -  06/25/15 1545 109/65 mmHg - - (!) 57 16 100 % - -  06/25/15 1530 (!) 102/58 mmHg - - (!) 59 15 97 % - -  06/25/15 1515 95/60 mmHg - - 65 16 100 % - -  06/25/15 1500 (!) 92/58 mmHg - - 72 15 100 % - -  06/25/15 1451 (!) 94/53 mmHg 97.4 F (36.3 C) - - 12 100 % - -  06/25/15 0949 - - - - - - 6\' 3"  (1.905 m) 74.844 kg (165 lb)  06/25/15 0948 (!) 153/87 mmHg 97.6 F (36.4 C) Oral 88 16 99 % - -     Recent laboratory studies:  Recent Labs  06/26/15 0430  WBC 10.2  HGB 11.5*  HCT 34.5*  PLT 137*  NA 134*  K 3.9  CL 103  CO2 25  BUN 14  CREATININE 0.80  GLUCOSE 113*  CALCIUM 8.4*     Discharge Medications:     Medication List  TAKE these medications        aspirin 325 MG EC tablet  Take 1 tablet (325 mg total) by mouth 2 (two) times daily after a meal.     methocarbamol 500 MG tablet  Commonly known as:  ROBAXIN  Take 1 tablet (500 mg total) by mouth every 6 (six) hours as needed for muscle spasms.     multivitamin with minerals Tabs tablet  Take 1 tablet by mouth daily.     OSTEO BI-FLEX ADV TRIPLE ST PO  Take 1 tablet by mouth daily.     GRAPE SEED COMPLEX PO  Take 1 tablet by mouth daily.     OVER THE COUNTER MEDICATION  Take 1 tablet by mouth daily. BETA PROSTATE     oxyCODONE-acetaminophen 5-325 MG per tablet  Commonly known as:  ROXICET  Take 1-2 tablets by mouth every 4 (four) hours as needed.     TURMERIC PO  Take 1 tablet by mouth daily.        Diagnostic Studies: Dg C-arm 1-60 Min-no Report  06/25/2015   CLINICAL DATA: hip   C-ARM 1-60 MINUTES  Fluoroscopy was utilized by the requesting physician.  No radiographic  interpretation.    Dg Hip Unilat With Pelvis 1v  Left  06/25/2015   CLINICAL DATA:  Left total hip replacement.  Initial encounter.  EXAM: DG C-ARM 1-60 MIN - NRPT [facility]; DG HIP (WITH OR WITHOUT PELVIS) 1V*L*  COMPARISON:  None.  FLUOROSCOPY TIME:  1 minutes and 24 seconds  FINDINGS: Two spot fluoroscopic images demonstrate left total hip arthroplasty with a screw fixed acetabular component. Hardware appears well positioned. No evidence of acute fracture or dislocation.  IMPRESSION: No demonstrated complication following left total hip arthroplasty.   Electronically Signed   By: Carey Bullocks M.D.   On: 06/25/2015 14:56   Dg Hip Port Unilat With Pelvis 1v Left  06/25/2015   CLINICAL DATA:  Postop left total hip replacement.  EXAM: DG HIP (WITH OR WITHOUT PELVIS) 1V PORT LEFT  COMPARISON:  None.  FINDINGS: Changes of left hip replacement. Normal alignment. No hardware or bony complicating feature.  IMPRESSION: Left hip replacement.  No complicating feature.   Electronically Signed   By: Charlett Nose M.D.   On: 06/25/2015 15:38    Disposition: to home      Discharge Instructions    Call MD / Call 911    Complete by:  As directed   If you experience chest pain or shortness of breath, CALL 911 and be transported to the hospital emergency room.  If you develope a fever above 101 F, pus (white drainage) or increased drainage or redness at the wound, or calf pain, call your surgeon's office.     Constipation Prevention    Complete by:  As directed   Drink plenty of fluids.  Prune juice may be helpful.  You may use a stool softener, such as Colace (over the counter) 100 mg twice a day.  Use MiraLax (over the counter) for constipation as needed.     Diet - low sodium heart healthy    Complete by:  As directed      Increase activity slowly as tolerated    Complete by:  As directed            Follow-up Information    Follow up with Kathryne Hitch, MD In 2 weeks.   Specialty:  Orthopedic Surgery   Contact information:   [address] [place] [address] [phone]        Signed: Kathryne Hitch 06/26/2015, 7:20 AM

## 2015-06-26 NOTE — Care Management Note (Signed)
Case Management Note  Patient Details  Name: Michael Noble MRN: [id number] Date of Birth: 03/29/1951  Subjective/Objective:                  total replacement of left hip   Action/Plan:  Home health  Expected Discharge Date:  06/26/15               Expected Discharge Plan:     In-House Referral:     Discharge planning Services  CM Consult  Post Acute Care Choice:  Home Health Choice offered to:  Patient  DME Arranged:  N/A DME Agency:  NA  HH Arranged:  PT HH Agency:  [facility]  Status of Service:  Completed, signed off  Medicare Important Message Given:    Date Medicare IM Given:    Medicare IM give by:    Date Additional Medicare IM Given:    Additional Medicare Important Message give by:     If discussed at Long Length of Stay Meetings, dates discussed:    Additional Comments:  CM spoke with patient at the bedside. Genevieve Norlander was set-up pre-operatively, patient agrees. Has a RW and 3N1 at home. His spouse will assist him at home.  Antony Haste, RN 06/26/2015, 10:46 AM

## 2015-06-26 NOTE — Discharge Instructions (Signed)

## 2015-06-26 NOTE — Progress Notes (Signed)
OT Cancellation Note  Patient Details Name: Taboris Tedrow MRN: [id number] DOB: [birth year]   Cancelled Treatment:    Reason Eval/Treat Not Completed: OT screened, no needs identified, will sign off.  Pt's wife assisted him with ADLs this am.  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 Does not feel he will have any difficulty at home.  Will sign off.   Juneau Doughman 06/26/2015, 11:27 AM  Marica Otter, OTR/L [phone] 06/26/2015

## 2015-06-26 NOTE — Op Note (Signed)
NAMEHARRY, Noble NO.:  [id number]  MEDICAL RECORD NO.:  [id number]  LOCATION:  1601                         FACILITY:  [facility]  PHYSICIAN:  Vanita Panda. Magnus Ivan, M.D.DATE OF BIRTH:  August 06, 1951  DATE OF PROCEDURE:  06/25/2015 DATE OF DISCHARGE:                              OPERATIVE REPORT   PREOPERATIVE DIAGNOSES:  Primary osteoarthritis and degenerative joint disease of left hip.  POSTOPERATIVE DIAGNOSES:  Primary osteoarthritis and degenerative joint disease of left hip.  PROCEDURE:  Left total hip arthroplasty through direct anterior approach.  IMPLANTS:  DePuy Sector Gription acetabular component, size 58 with two screws, size 36+ 4 neutral polyethylene liner, size 18 Corail femoral component with varus offset (KLA), size 36+ 5 ceramic hip ball.  SURGEON:  Vanita Panda. Magnus Ivan, M.D.  ASSISTANT:  Richardean Canal, P.A.  ANESTHESIA:  Spinal.  ANTIBIOTICS:  2 g of IV Ancef.  BLOOD LOSS:  Less than 500 mL.  COMPLICATIONS:  None.  INDICATIONS:  Michael Noble is a 64 year old gentleman who is actually well known to me.  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  He has had worsening left hip pain for over 5 years.  Now, his left leg is actually shorter than his right side and we eventually had x-rays of him in the office that showed severe osteoarthritis and degenerative joint disease of his left hip with actually starting to have subluxation of that hip.  It was significantly short.  His pain is daily.  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  At this point, he does wish to proceed with hip replacement surgery.  We talked to him about direct anterior hip surgery and discussed in detail the risks and benefits of the surgery including the risk of acute blood loss anemia, nerve and  vessel injury, fracture, infection, dislocation, DVT. He understands our goals for decreased pain, improved mobility, and overall improved quality of life.  PROCEDURE DESCRIPTION:  After informed consent was obtained, appropriate left hip was marked.  He was brought to the operating room and while he was on a stretcher, spinal anesthesia was obtained.  A Foley catheter was placed and he was laid in a supine position with traction boots on his feet.  He was placed supine on the Hana fracture table with perineal post was in place and both legs in inline skeletal traction devices, but no traction applied.  His left operative hip was then prepped and draped with DuraPrep and sterile drapes.  A time-out was called to identify correct patient and correct left hip.  We then made an incision inferior and posterior to the anterior superior iliac spine and carried obliquely down the leg.  We dissected down to the tensor fascia lata muscle and the tensor fascia was then divided longitudinally, so we could proceed with direct anterior approach to the hip.  We identified and cauterized the lateral femoral circumflex vessels and then identified the hip capsule.  We were able to open up the hip capsule and found a joint  effusion.  We placed the Cobra retractors within the hip capsule.  We then made our femoral neck cut proximal to the lesser trochanter using an oscillating saw and completed this with an osteotome.  We placed a corkscrew guide in the femoral head and removed the femoral head in its entirety and found it to be deformed and devoid of cartilage, and could see be in a subluxed position.  We then cleaned the acetabulum, debris and remnants of the acetabular labrum.  I released the transverse acetabular ligament and placed a bent Hohmann across the medial acetabular rim.  We then began reaming from a size 42 under direct visualization going all the way up to a size 58 with the last 2  reamers also under direct fluoroscopy, so we could obtain our depth of reaming, our inclination and anteversion.  Once we were pleased with this, we placed a real DePuy Sector Gription acetabular component with size 58 and then two screws.  We then placed the real 36+ 4 neutral polyethylene liner for size 58 acetabular component.  Attention was then turned to the femur.  With the leg externally rotated to 100 degrees extended and adducted, we were able to place a Mueller retractor medially and a Hohmann retractor behind the greater trochanter.  We released the lateral joint capsule and used a box cutting osteotome and a rongeur to lateralize.  We then began broaching from size 8 broach using a Corail broaching system and went all the way to the size 18.  With the size 18 in place, we trialed a varus offset femoral neck because he has been used to living in a tightened, sublux position.  We then trialed a 36+ 5 hip ball, brought the leg back over and up with traction and internal rotation reducing the pelvis.  It was nice tight throughout its arc of rotation.  I was pleased with increasing his leg length as well.  We then dislocated the hip and removed the trial components.  We placed the real Corail femoral component with varus offset size 18 and the real 36+ 5 ceramic hip ball and reduced this back in the acetabulum and we were pleased with stability.  We copiously irrigated the soft tissues with normal saline solution using pulsatile lavage.  We closed the joint capsule with interrupted inverted #1 Ethibond suture followed by #1 Vicryl in the tensor fascia, 0 Vicryl in the deep tissue, 2-0 Vicryl in the subcutaneous tissue, staples on the skin.  A Xeroform and Aquacel dressing were applied.  He was then taken off the Hana table and taken to the recovery room in stable condition.  All final counts were correct.  There were no complications noted.  Of note, Richardean Canal, PA- C, assisted  in the entire case and his assistance was crucial for facilitating all aspects of this case.     Vanita Panda. Magnus Ivan, M.D.     CYB/MEDQ  D:  06/25/2015  T:  06/26/2015  Job:  [id number]

## 2015-06-26 NOTE — Progress Notes (Signed)
Pt and wife provided d/c instructions and Rx given. Pt expressed understanding no questions at this time. Pt and belongings rolled to front door by nurse tech.   Thane Edu, RN

## 2015-06-28 ENCOUNTER — Encounter (HOSPITAL_COMMUNITY): Payer: Self-pay | Admitting: Orthopaedic Surgery

## 2019-09-03 ENCOUNTER — Encounter (HOSPITAL_BASED_OUTPATIENT_CLINIC_OR_DEPARTMENT_OTHER): Payer: Self-pay

## 2019-09-03 ENCOUNTER — Inpatient Hospital Stay (HOSPITAL_BASED_OUTPATIENT_CLINIC_OR_DEPARTMENT_OTHER)
Admission: EM | Admit: 2019-09-03 | Discharge: 2019-09-07 | DRG: 308 | Disposition: A | Discharge status: Home / Self Care | Payer: Medicare Other | Attending: Family Medicine | Admitting: Family Medicine

## 2019-09-03 ENCOUNTER — Emergency Department (HOSPITAL_BASED_OUTPATIENT_CLINIC_OR_DEPARTMENT_OTHER): Payer: Medicare Other

## 2019-09-03 ENCOUNTER — Other Ambulatory Visit: Payer: Self-pay

## 2019-09-03 DIAGNOSIS — I5021 Acute systolic (congestive) heart failure: Secondary | ICD-10-CM | POA: Diagnosis present

## 2019-09-03 DIAGNOSIS — I4819 Other persistent atrial fibrillation: Principal | ICD-10-CM | POA: Diagnosis present

## 2019-09-03 DIAGNOSIS — R7989 Other specified abnormal findings of blood chemistry: Secondary | ICD-10-CM

## 2019-09-03 DIAGNOSIS — I361 Nonrheumatic tricuspid (valve) insufficiency: Secondary | ICD-10-CM | POA: Diagnosis not present

## 2019-09-03 DIAGNOSIS — I4891 Unspecified atrial fibrillation: Secondary | ICD-10-CM | POA: Diagnosis present

## 2019-09-03 DIAGNOSIS — R945 Abnormal results of liver function studies: Secondary | ICD-10-CM

## 2019-09-03 DIAGNOSIS — I509 Heart failure, unspecified: Secondary | ICD-10-CM | POA: Diagnosis not present

## 2019-09-03 DIAGNOSIS — Z79899 Other long term (current) drug therapy: Secondary | ICD-10-CM

## 2019-09-03 DIAGNOSIS — N179 Acute kidney failure, unspecified: Secondary | ICD-10-CM | POA: Diagnosis present

## 2019-09-03 DIAGNOSIS — Z8249 Family history of ischemic heart disease and other diseases of the circulatory system: Secondary | ICD-10-CM

## 2019-09-03 DIAGNOSIS — R059 Cough, unspecified: Secondary | ICD-10-CM

## 2019-09-03 DIAGNOSIS — Z7982 Long term (current) use of aspirin: Secondary | ICD-10-CM

## 2019-09-03 DIAGNOSIS — Z20828 Contact with and (suspected) exposure to other viral communicable diseases: Secondary | ICD-10-CM | POA: Diagnosis present

## 2019-09-03 DIAGNOSIS — Z23 Encounter for immunization: Secondary | ICD-10-CM | POA: Diagnosis present

## 2019-09-03 DIAGNOSIS — Z87891 Personal history of nicotine dependence: Secondary | ICD-10-CM

## 2019-09-03 DIAGNOSIS — Z96642 Presence of left artificial hip joint: Secondary | ICD-10-CM | POA: Diagnosis present

## 2019-09-03 DIAGNOSIS — R05 Cough: Secondary | ICD-10-CM

## 2019-09-03 DIAGNOSIS — I340 Nonrheumatic mitral (valve) insufficiency: Secondary | ICD-10-CM | POA: Diagnosis not present

## 2019-09-03 DIAGNOSIS — N289 Disorder of kidney and ureter, unspecified: Secondary | ICD-10-CM | POA: Diagnosis not present

## 2019-09-03 LAB — COMPREHENSIVE METABOLIC PANEL
ALT: 83 U/L — ABNORMAL HIGH (ref 0–44)
AST: 101 U/L — ABNORMAL HIGH (ref 15–41)
Albumin: 3.7 g/dL (ref 3.5–5.0)
Alkaline Phosphatase: 72 U/L (ref 38–126)
Anion gap: 10 (ref 5–15)
BUN: 42 mg/dL — ABNORMAL HIGH (ref 8–23)
CO2: 22 mmol/L (ref 22–32)
Calcium: 9.1 mg/dL (ref 8.9–10.3)
Chloride: 106 mmol/L (ref 98–111)
Creatinine, Ser: 1.32 mg/dL — ABNORMAL HIGH (ref 0.61–1.24)
GFR calc Af Amer: >60 mL/min (ref >60)
GFR calc non Af Amer: 56 mL/min — ABNORMAL LOW (ref >60)
Glucose, Bld: 117 mg/dL — ABNORMAL HIGH (ref 70–99)
Potassium: 4 mmol/L (ref 3.5–5.1)
Sodium: 138 mmol/L (ref 135–145)
Total Bilirubin: 2 mg/dL — ABNORMAL HIGH (ref 0.3–1.2)
Total Protein: 7.2 g/dL (ref 6.5–8.1)

## 2019-09-03 LAB — PROTIME-INR
INR: 1.6 — ABNORMAL HIGH (ref 0.8–1.2)
Prothrombin Time: 18.4 seconds — ABNORMAL HIGH (ref 11.4–15.2)

## 2019-09-03 LAB — CBC WITH DIFFERENTIAL/PLATELET
Abs Immature Granulocytes: 0.02 10*3/uL (ref 0.00–0.07)
Basophils Absolute: 0 10*3/uL (ref 0.0–0.1)
Basophils Relative: 0 %
Eosinophils Absolute: 0 10*3/uL (ref 0.0–0.5)
Eosinophils Relative: 0 %
HCT: 38.8 % — ABNORMAL LOW (ref 39.0–52.0)
Hemoglobin: 12.1 g/dL — ABNORMAL LOW (ref 13.0–17.0)
Immature Granulocytes: 0 %
Lymphocytes Relative: 20 %
Lymphs Abs: 1.6 10*3/uL (ref 0.7–4.0)
MCH: 27.8 pg (ref 26.0–34.0)
MCHC: 31.2 g/dL (ref 30.0–36.0)
MCV: 89 fL (ref 80.0–100.0)
Monocytes Absolute: 0.8 10*3/uL (ref 0.1–1.0)
Monocytes Relative: 10 %
Neutro Abs: 5.3 10*3/uL (ref 1.7–7.7)
Neutrophils Relative %: 70 %
Platelets: 195 10*3/uL (ref 150–400)
RBC: 4.36 MIL/uL (ref 4.22–5.81)
RDW: 14.2 % (ref 11.5–15.5)
WBC: 7.7 10*3/uL (ref 4.0–10.5)
nRBC: 0 % (ref 0.0–0.2)

## 2019-09-03 LAB — BRAIN NATRIURETIC PEPTIDE: B Natriuretic Peptide: 667.1 pg/mL — ABNORMAL HIGH (ref 0.0–100.0)

## 2019-09-03 LAB — SARS CORONAVIRUS 2 (TAT 6-24 HRS): SARS Coronavirus 2: NEGATIVE

## 2019-09-03 MED ORDER — DILTIAZEM HCL 100 MG IV SOLR
5.0000 mg/h | INTRAVENOUS | Status: DC
  Administered 2019-09-03: 13:00:00 5 mg/h via INTRAVENOUS
  Filled 2019-09-03: qty 100

## 2019-09-03 MED ORDER — FUROSEMIDE 10 MG/ML IJ SOLN
40.0000 mg | Freq: Once | INTRAMUSCULAR | Status: AC
  Administered 2019-09-03: 14:00:00 40 mg via INTRAVENOUS
  Filled 2019-09-03: qty 4

## 2019-09-03 MED ORDER — DILTIAZEM HCL 100 MG IV SOLR
INTRAVENOUS | Status: AC
  Filled 2019-09-03: qty 100

## 2019-09-03 MED ORDER — SODIUM CHLORIDE 0.9 % IV SOLN
Freq: Once | INTRAVENOUS | Status: AC
  Administered 2019-09-03: 13:00:00 via INTRAVENOUS

## 2019-09-03 MED ORDER — APIXABAN 2.5 MG PO TABS
5.0000 mg | ORAL_TABLET | Freq: Once | ORAL | Status: AC
  Administered 2019-09-03: 5 mg via ORAL
  Filled 2019-09-03: qty 2

## 2019-09-03 MED ORDER — DILTIAZEM LOAD VIA INFUSION
15.0000 mg | Freq: Once | INTRAVENOUS | Status: DC
  Administered 2019-09-03: 15 mg via INTRAVENOUS
  Filled 2019-09-03: qty 15

## 2019-09-03 NOTE — ED Notes (Signed)
Report to carelink.  

## 2019-09-03 NOTE — ED Provider Notes (Signed)
[facility] EMERGENCY DEPARTMENT Provider Note   CSN: [id number] Arrival date & time: 09/03/19  1229     History   Chief Complaint Chief Complaint  Patient presents with   Leg Swelling    HPI Michael Noble is a 68 y.o. male.     Patient is a 68 year old male with no significant past medical history presenting today with 4 days of worsening leg swelling, shortness of breath with exertion, cough and mild shortness of breath with laying down and intermittent feelings of his heart racing.  He has never had anything like this before but states that over the last 4 days his legs have continued to swell.  He has had nonproductive cough and denies any fever.  No chest pain, abdominal pain or vomiting.  He takes no medications regularly.  No sick contacts.  The history is provided by the patient.    Past Medical History:  Diagnosis Date   Arthritis    Hepatitis    hx of 35 years ago     Patient Active Problem List   Diagnosis Date Noted   Osteoarthritis of left hip 06/25/2015   Status post total replacement of left hip 06/25/2015    Past Surgical History:  Procedure Laterality Date   TOTAL HIP ARTHROPLASTY Left 06/25/2015   Procedure: LEFT TOTAL HIP ARTHROPLASTY ANTERIOR APPROACH;  Surgeon: Kathryne Hitch, MD;  Location: [facility]S;  Service: Orthopedics;  Laterality: Left;        Home Medications    Prior to Admission medications   Medication Sig Start Date End Date Taking? Authorizing Provider  Multiple Vitamin (MULTIVITAMIN WITH MINERALS) TABS tablet Take 1 tablet by mouth daily.   Yes [provider]  TURMERIC PO Take 1 tablet by mouth daily.   Yes [provider]  aspirin EC 325 MG EC tablet Take 1 tablet (325 mg total) by mouth 2 (two) times daily after a meal. 06/26/15   Kathryne Hitch, MD  methocarbamol (ROBAXIN) 500 MG tablet Take 1 tablet (500 mg total) by mouth every 6 (six) hours as needed for muscle spasms. 06/26/15    Kathryne Hitch, MD  Misc Natural Products (GRAPE SEED COMPLEX PO) Take 1 tablet by mouth daily.    [provider]  Misc Natural Products (OSTEO BI-FLEX ADV TRIPLE ST PO) Take 1 tablet by mouth daily.    [provider]  OVER THE COUNTER MEDICATION Take 1 tablet by mouth daily. BETA PROSTATE    [provider]  oxyCODONE-acetaminophen (ROXICET) 5-325 MG per tablet Take 1-2 tablets by mouth every 4 (four) hours as needed. 06/26/15   Kathryne Hitch, MD    Family History History reviewed. No pertinent family history.  Social History Social History   Tobacco Use   Smoking status: Former Smoker   Smokeless tobacco: Former Neurosurgeon    Types: Chew  Substance Use Topics   Alcohol use: No   Drug use: No     Allergies   Patient has no known allergies.   Review of Systems Review of Systems  All other systems reviewed and are negative.    Physical Exam Updated Vital Signs BP (!) 130/98 (BP Location: Right Arm)    Pulse (!) 159    Temp 97.8 F (36.6 C) (Oral)    Resp (!) 24    Ht 6\' 2"  (1.88 m)    Wt 85 kg    SpO2 100%    BMI 24.06 kg/m   Physical Exam  Vitals signs and nursing note reviewed.  Constitutional:      General: He is not in acute distress.    Appearance: He is well-developed and normal weight.  HENT:     Head: Normocephalic and atraumatic.     Mouth/Throat:     Mouth: Mucous membranes are moist.  Eyes:     Conjunctiva/sclera: Conjunctivae normal.     Pupils: Pupils are equal, round, and reactive to light.  Neck:     Musculoskeletal: Normal range of motion and neck supple.  Cardiovascular:     Rate and Rhythm: Tachycardia present. Rhythm irregularly irregular.     Heart sounds: No murmur.  Pulmonary:     Effort: Pulmonary effort is normal. No respiratory distress.     Breath sounds: Examination of the right-lower field reveals rales. Examination of the left-lower field reveals rales. Rales present. No wheezing.    Abdominal:     General: There is no distension.     Palpations: Abdomen is soft.     Tenderness: There is no abdominal tenderness. There is no guarding or rebound.  Musculoskeletal: Normal range of motion.        General: No tenderness.     Right lower leg: Edema present.     Left lower leg: Edema present.     Comments: 3+ pitting edema bilaterally up to the midshin  Skin:    General: Skin is warm and dry.     Findings: No erythema or rash.  Neurological:     General: No focal deficit present.     Mental Status: He is alert and oriented to person, place, and time. Mental status is at baseline.  Psychiatric:        Mood and Affect: Mood normal.        Behavior: Behavior normal.        Thought Content: Thought content normal.      ED Treatments / Results  Labs (all labs ordered are listed, but only abnormal results are displayed) Labs Reviewed  CBC WITH DIFFERENTIAL/PLATELET - Abnormal; Notable for the following components:      Result Value   Hemoglobin 12.1 (*)    HCT 38.8 (*)    All other components within normal limits  COMPREHENSIVE METABOLIC PANEL - Abnormal; Notable for the following components:   Glucose, Bld 117 (*)    BUN 42 (*)    Creatinine, Ser 1.32 (*)    AST 101 (*)    ALT 83 (*)    Total Bilirubin 2.0 (*)    GFR calc non Af Amer 56 (*)    All other components within normal limits  BRAIN NATRIURETIC PEPTIDE - Abnormal; Notable for the following components:   B Natriuretic Peptide 667.1 (*)    All other components within normal limits  PROTIME-INR - Abnormal; Notable for the following components:   Prothrombin Time 18.4 (*)    INR 1.6 (*)    All other components within normal limits  SARS CORONAVIRUS 2 (TAT 6-24 HRS)    EKG EKG Interpretation  Date/Time:  Wednesday September 03 2019 12:42:21 EDT Ventricular Rate:  162 PR Interval:    QRS Duration: 93 QT Interval:  289 QTC Calculation: 475 R Axis:   132 Text Interpretation:  Atrial fibrillation  with rapid V-rate Ventricular premature complex Right axis deviation Minimal ST depression, inferior leads Nonspecific T abnormalities, lateral leads No previous tracing Confirmed by Gwyneth Sprout ([id number]) on 09/03/2019 12:54:10 PM   Radiology Dg Chest [facility] [address]  Result Date: 09/03/2019 CLINICAL DATA:  Bilateral leg swelling for 4 days. EXAM: PORTABLE CHEST 1 VIEW COMPARISON:  None. FINDINGS: Cardiac silhouette is borderline enlarged. No mediastinal or hilar masses. Mild interstitial thickening noted most evident right peripheral lung base. Lungs otherwise clear. Suspect small right effusion. No pneumothorax. Skeletal structures are grossly intact. IMPRESSION: 1. Borderline cardiomegaly, mild interstitial thickening and probable small right effusion. Suspect mild congestive heart failure. Electronically Signed   By: Lajean Manes M.D.   On: 09/03/2019 13:30    Procedures Procedures (including critical care time)  Medications Ordered in ED Medications  diltiazem (CARDIZEM) 1 mg/mL load via infusion 15 mg (has no administration in time range)    And  diltiazem (CARDIZEM) 100 mg in dextrose 5 % 100 mL (1 mg/mL) infusion (has no administration in time range)     Initial Impression / Assessment and Plan / ED Course  I have reviewed the triage vital signs and the nursing notes.  Pertinent labs & imaging results that were available during my care of the patient were reviewed by me and considered in my medical decision making (see chart for details).       Elderly male with no known medical problems presenting today with evidence of atrial fibrillation with RVR and new signs of fluid overload.  Patient has never had anything like this before and takes no medications.  He last saw Dr. for years ago for hip replacement but has not followed with anyone since.  On exam patient's heart rate is between 150 and 170.  He is talking in full sentences and is in no acute distress.  He does appear  fluid overloaded with significant edema in bilateral lower extremities and some bibasilar rales.  Patient's EKG is consistent with A. fib RVR.  Blood pressure is stable at this time.  Patient given Cardizem bolus and infusion.  Labs and x-ray are pending.  2:45 PM Patient CBC without acute findings, CMP with mild AKI of 1.32 and mild elevation of LFTs.  BNP is 667, INR mildly elevated at 1.6.  Patient given IV Lasix.  Chest x-ray shows evidence of cardiomegaly and pulmonary edema.  Patient is currently on a Cardizem drip and is on 10 now with heart rate still between 1 30-1 50.  We will continue to titrate.  Plan is for admission for ongoing treatment.  CRITICAL CARE Performed by: Keante Urizar Total critical care time: 30 minutes Critical care time was exclusive of separately billable procedures and treating other patients. Critical care was necessary to treat or prevent imminent or life-threatening deterioration. Critical care was time spent personally by me on the following activities: development of treatment plan with patient and/or surrogate as well as nursing, discussions with consultants, evaluation of patient's response to treatment, examination of patient, obtaining history from patient or surrogate, ordering and performing treatments and interventions, ordering and review of laboratory studies, ordering and review of radiographic studies, pulse oximetry and re-evaluation of patient's condition.   Final Clinical Impressions(s) / ED Diagnoses   Final diagnoses:  Atrial fibrillation with RVR ([place])  Acute congestive heart failure, unspecified heart failure type [facility])    ED Discharge Orders    None       Blanchie Dessert, MD 09/03/19 1447

## 2019-09-03 NOTE — ED Triage Notes (Signed)
Pt states that he has noticed swelling in his legs since Sunday with some coughing. Pt ambulated to room with NAD.

## 2019-09-03 NOTE — ED Notes (Signed)
Attempted to call report to the room. RN unable to take report. Care handoff completed.

## 2019-09-03 NOTE — ED Notes (Signed)
Pt on monitor 

## 2019-09-03 NOTE — ED Notes (Signed)
ED Provider at bedside. 

## 2019-09-03 NOTE — Progress Notes (Signed)
Patient with h/o remote hepatitis presenting to [facility] with LE edema and cough.  Last saw a doctor 4 years ago.  Since Saturday, LE edema and cough.  Found to have afib with RVR and palpitations.  Placed on Cardizem drip.  Given Lasix for volume overload.  Will give a dose of Eliquis.  Will admit to SDU for further evaluation and treatment.  Carlyon Shadow, M.D.

## 2019-09-03 NOTE — ED Notes (Signed)
Called to [facility]. Secretary states room is being cleaned now. They are to call me when it is cleaned.

## 2019-09-04 ENCOUNTER — Encounter (HOSPITAL_COMMUNITY): Payer: Self-pay | Admitting: Internal Medicine

## 2019-09-04 ENCOUNTER — Inpatient Hospital Stay (HOSPITAL_COMMUNITY): Payer: Medicare Other

## 2019-09-04 DIAGNOSIS — I509 Heart failure, unspecified: Secondary | ICD-10-CM

## 2019-09-04 DIAGNOSIS — I4891 Unspecified atrial fibrillation: Secondary | ICD-10-CM

## 2019-09-04 DIAGNOSIS — N179 Acute kidney failure, unspecified: Secondary | ICD-10-CM

## 2019-09-04 DIAGNOSIS — I5021 Acute systolic (congestive) heart failure: Secondary | ICD-10-CM

## 2019-09-04 DIAGNOSIS — I340 Nonrheumatic mitral (valve) insufficiency: Secondary | ICD-10-CM

## 2019-09-04 DIAGNOSIS — R7989 Other specified abnormal findings of blood chemistry: Secondary | ICD-10-CM | POA: Diagnosis present

## 2019-09-04 DIAGNOSIS — R945 Abnormal results of liver function studies: Secondary | ICD-10-CM | POA: Diagnosis present

## 2019-09-04 DIAGNOSIS — I361 Nonrheumatic tricuspid (valve) insufficiency: Secondary | ICD-10-CM

## 2019-09-04 LAB — LIPID PANEL
Cholesterol: 83 mg/dL (ref 0–200)
HDL: 23 mg/dL — ABNORMAL LOW (ref >40)
LDL Cholesterol: 52 mg/dL (ref 0–99)
Total CHOL/HDL Ratio: 3.6 RATIO
Triglycerides: 41 mg/dL (ref <150)
VLDL: 8 mg/dL (ref 0–40)

## 2019-09-04 LAB — MAGNESIUM: Magnesium: 1.8 mg/dL (ref 1.7–2.4)

## 2019-09-04 LAB — HEMOGLOBIN A1C
Hgb A1c MFr Bld: 5.5 % (ref 4.8–5.6)
Mean Plasma Glucose: 111.15 mg/dL

## 2019-09-04 LAB — APTT: aPTT: 31 seconds (ref 24–36)

## 2019-09-04 LAB — HEPATIC FUNCTION PANEL
ALT: 81 U/L — ABNORMAL HIGH (ref 0–44)
AST: 93 U/L — ABNORMAL HIGH (ref 15–41)
Albumin: 3.2 g/dL — ABNORMAL LOW (ref 3.5–5.0)
Alkaline Phosphatase: 67 U/L (ref 38–126)
Bilirubin, Direct: 0.3 mg/dL — ABNORMAL HIGH (ref 0.0–0.2)
Indirect Bilirubin: 1.3 mg/dL — ABNORMAL HIGH (ref 0.3–0.9)
Total Bilirubin: 1.6 mg/dL — ABNORMAL HIGH (ref 0.3–1.2)
Total Protein: 6.4 g/dL — ABNORMAL LOW (ref 6.5–8.1)

## 2019-09-04 LAB — ECHOCARDIOGRAM COMPLETE
Height: 74 in
Weight: 2740.76 oz

## 2019-09-04 LAB — TROPONIN I (HIGH SENSITIVITY)
Troponin I (High Sensitivity): 41 ng/L — ABNORMAL HIGH (ref <18)
Troponin I (High Sensitivity): 39 ng/L — ABNORMAL HIGH (ref <18)

## 2019-09-04 LAB — HIV ANTIBODY (ROUTINE TESTING W REFLEX): HIV Screen 4th Generation wRfx: NONREACTIVE

## 2019-09-04 LAB — TSH: TSH: 1.482 u[IU]/mL (ref 0.350–4.500)

## 2019-09-04 MED ORDER — SODIUM CHLORIDE 0.9 % IV SOLN: 250.0000 mL | INTRAVENOUS | Status: DC | PRN

## 2019-09-04 MED ORDER — ASPIRIN EC 81 MG PO TBEC
81.0000 mg | DELAYED_RELEASE_TABLET | Freq: Two times a day (BID) | ORAL | Status: DC
  Administered 2019-09-04 – 2019-09-05 (×4): 81 mg via ORAL
  Filled 2019-09-04 (×4): qty 1

## 2019-09-04 MED ORDER — AMIODARONE HCL IN DEXTROSE 360-4.14 MG/200ML-% IV SOLN
30.0000 mg/h | INTRAVENOUS | Status: DC
  Administered 2019-09-05 (×2): 30 mg/h via INTRAVENOUS
  Filled 2019-09-04 (×2): qty 200

## 2019-09-04 MED ORDER — SODIUM CHLORIDE 0.9% FLUSH: 3.0000 mL | INTRAVENOUS | Status: DC | PRN

## 2019-09-04 MED ORDER — POTASSIUM CHLORIDE CRYS ER 20 MEQ PO TBCR
20.0000 meq | EXTENDED_RELEASE_TABLET | Freq: Once | ORAL | Status: AC
  Administered 2019-09-04: 01:00:00 20 meq via ORAL
  Filled 2019-09-04: qty 1

## 2019-09-04 MED ORDER — AMIODARONE LOAD VIA INFUSION
150.0000 mg | Freq: Once | INTRAVENOUS | Status: AC
  Administered 2019-09-04: 16:00:00 150 mg via INTRAVENOUS
  Filled 2019-09-04: qty 83.34

## 2019-09-04 MED ORDER — DILTIAZEM HCL-DEXTROSE 125-5 MG/125ML-% IV SOLN (PREMIX)
5.0000 mg/h | INTRAVENOUS | Status: DC
  Administered 2019-09-04 (×2): 12.5 mg/h via INTRAVENOUS
  Filled 2019-09-04 (×3): qty 125

## 2019-09-04 MED ORDER — LYSINE 500 MG PO TABS: 500.0000 mg | ORAL_TABLET | Freq: Every day | ORAL | Status: DC

## 2019-09-04 MED ORDER — SODIUM CHLORIDE 0.9% FLUSH
3.0000 mL | Freq: Two times a day (BID) | INTRAVENOUS | Status: DC
  Administered 2019-09-04 – 2019-09-06 (×5): 3 mL via INTRAVENOUS

## 2019-09-04 MED ORDER — AMIODARONE HCL IN DEXTROSE 360-4.14 MG/200ML-% IV SOLN
60.0000 mg/h | INTRAVENOUS | Status: DC
  Administered 2019-09-04 (×2): 60 mg/h via INTRAVENOUS
  Filled 2019-09-04 (×2): qty 200

## 2019-09-04 MED ORDER — FUROSEMIDE 10 MG/ML IJ SOLN
20.0000 mg | Freq: Two times a day (BID) | INTRAMUSCULAR | Status: AC
  Administered 2019-09-04 – 2019-09-06 (×6): 20 mg via INTRAVENOUS
  Filled 2019-09-04 (×7): qty 2

## 2019-09-04 MED ORDER — APIXABAN 5 MG PO TABS
5.0000 mg | ORAL_TABLET | Freq: Two times a day (BID) | ORAL | Status: DC
  Administered 2019-09-04 – 2019-09-07 (×7): 5 mg via ORAL
  Filled 2019-09-04 (×7): qty 1

## 2019-09-04 MED ORDER — ONDANSETRON HCL 4 MG/2ML IJ SOLN: 4.0000 mg | Freq: Four times a day (QID) | INTRAMUSCULAR | Status: DC | PRN

## 2019-09-04 MED ORDER — ADULT MULTIVITAMIN W/MINERALS CH
1.0000 | ORAL_TABLET | Freq: Every day | ORAL | Status: DC
  Administered 2019-09-04 – 2019-09-07 (×4): 1 via ORAL
  Filled 2019-09-04 (×4): qty 1

## 2019-09-04 MED ORDER — DIGOXIN 250 MCG PO TABS
0.2500 mg | ORAL_TABLET | ORAL | Status: AC
  Administered 2019-09-04 – 2019-09-05 (×3): 0.25 mg via ORAL
  Filled 2019-09-04 (×3): qty 1

## 2019-09-04 NOTE — Consult Note (Addendum)
ication .   Cardiology Consultation:   Patient ID: Michael Noble; [id number]; [birth year]   Admit date: 09/03/2019 Date of Consult: 09/04/2019  Primary Care Provider: Patient, No Pcp Per Primary Cardiologist: N/A Primary Electrophysiologist:  N/A   Patient Profile:   Michael Noble is a 68 y.o. male with a hx of arthritis s/p hip replacement who is being seen today for the evaluation of A.fib w/ RVR at the request of Dr.Niu.  History of Present Illness:   Michael Noble was examined and evaluated at bedside this PM with wife at bedside. He states he was in his usual state of health until about a week and a half ago when he began to develop gradual onset shortness of breath with ankle swelling. Denies any inciting events. No chest pain, or palpitations. Over the course of the week, his dyspnea worsened with associated orthopnea at night requiring >3 pillows and his legs grew to 'elephant trunk.' He eventually came to the hospital as his symptoms worsened and state he waited so long because he is 'hard headed.' He mentions on his prior hospitalization for his hip replacement, he was told that he has an irregular heart rhythm and mentions that his pulse has 'always been very fast.'  He denies any prior cardiac disease. Mentions avoiding red meats, sugary drinks. Used to smoke but quit 'years ago.' Denies alcohol use, substance use. Family history of father with A.fib and ?Aneurysm. Mother with cardiac disease.  Past Medical History:  Diagnosis Date  . Arthritis   . Hepatitis    hx of 35 years ago     Past Surgical History:  Procedure Laterality Date  . TOTAL HIP ARTHROPLASTY Left 06/25/2015   Procedure: LEFT TOTAL HIP ARTHROPLASTY ANTERIOR APPROACH;  Surgeon: Kathryne Hitch, MD;  Location: [facility];  Service: Orthopedics;  Laterality: Left;     Home Medications:  Prior to Admission medications   Medication Sig Start Date End Date Taking? Authorizing Provider  Lysine 500 MG TABS  Take 500 mg by mouth daily.   Yes [provider]  Misc Natural Products (URINOZINC PO) Take 1 tablet by mouth daily.   Yes [provider]  Multiple Vitamin (MULTIVITAMIN WITH MINERALS) TABS tablet Take 1 tablet by mouth daily.   Yes [provider]  Multiple Vitamins-Minerals (EQ VISION FORMULA 50+ PO) Take 1 tablet by mouth daily.   Yes [provider]  TURMERIC PO Take 1 tablet by mouth daily.   Yes [provider]  aspirin EC 325 MG EC tablet Take 1 tablet (325 mg total) by mouth 2 (two) times daily after a meal. Patient not taking: Reported on 09/04/2019 06/26/15   Kathryne Hitch, MD    Inpatient Medications: Scheduled Meds: . apixaban  5 mg Oral BID  . aspirin  81 mg Oral BID PC  . furosemide  20 mg Intravenous Q12H  . multivitamin with minerals  1 tablet Oral Daily  . sodium chloride flush  3 mL Intravenous Q12H   Continuous Infusions: . sodium chloride    . diltiazem (CARDIZEM) infusion 12.5 mg/hr (09/04/19 0234)   PRN Meds: sodium chloride, ondansetron (ZOFRAN) IV, sodium chloride flush  Allergies:   No Known Allergies  Social History:   Social History   Socioeconomic History  . Marital status: Married    Spouse name: Not on file  . Number of children: Not on file  . Years of education: Not on file  . Highest education level: Not on file  Occupational  History  . Not on file  Social Needs  . Financial resource strain: Not on file  . Food insecurity    Worry: Not on file    Inability: Not on file  . Transportation needs    Medical: Not on file    Non-medical: Not on file  Tobacco Use  . Smoking status: Former Games developermoker  . Smokeless tobacco: Former NeurosurgeonUser    Types: Chew  Substance and Sexual Activity  . Alcohol use: No  . Drug use: No  . Sexual activity: Not on file  Lifestyle  . Physical activity    Days per week: Not on file    Minutes per session: Not on file  . Stress: Not on file  Relationships  .  Social Musicianconnections    Talks on phone: Not on file    Gets together: Not on file    Attends religious service: Not on file    Active member of club or organization: Not on file    Attends meetings of clubs or organizations: Not on file    Relationship status: Not on file  . Intimate partner violence    Fear of current or ex partner: Not on file    Emotionally abused: Not on file    Physically abused: Not on file    Forced sexual activity: Not on file  Other Topics Concern  . Not on file  Social History Narrative  . Not on file    Family History:    Family History  Problem Relation Age of Onset  . Heart disease Mother   . Atrial fibrillation Father      ROS:  Please see the history of present illness.  Review of Systems  Constitution: Negative for chills and fever.  Eyes: Negative for blurred vision.  Cardiovascular: Positive for leg swelling. Negative for chest pain.  Respiratory: Positive for cough and shortness of breath. Negative for sputum production.   Gastrointestinal: Negative for abdominal pain, constipation, diarrhea, nausea and vomiting.  Neurological: Negative for dizziness.    All other ROS reviewed and negative.     Physical Exam/Data:   Vitals:   09/03/19 2130 09/03/19 2328 09/03/19 2329 09/04/19 0541  BP: 102/68  104/72 91/76  Pulse: (!) 103  81 (!) 133  Resp: 13  18 18   Temp:   97.9 F (36.6 C) 97.8 F (36.6 C)  TempSrc:   Oral Oral  SpO2: 96%  96% 92%  Weight:  77.8 kg  77.7 kg  Height:  6\' 2"  (1.88 m)      Intake/Output Summary (Last 24 hours) at 09/04/2019 1059 Last data filed at 09/04/2019 0910 Gross per 24 hour  Intake 698.31 ml  Output 1875 ml  Net -1176.69 ml   Filed Weights   09/03/19 1237 09/03/19 2328 09/04/19 0541  Weight: 85 kg 77.8 kg 77.7 kg   Body mass index is 21.99 kg/m.   Gen: Well-developed, well nourished, NAD HEENT: NCAT head, hearing intact, EOMI, MMM Neck: supple, ROM intact, JVD up to mandible CV: Irregularly  irregular, no murmurs, no wheezes Pulm: Bilateral rales up to mid thorax Abd: Soft, BS+, NTND, No rebound, no guarding Extm: ROM intact, Peripheral pulses intact, 1+ pitting edema bilaterally, + varicose veins Skin: Dry, Warm, normal turgor   EKG:  The EKG was personally reviewed and demonstrates:  10/14 Initial EKG w/ A.flutter w/ RVR (HR 160) + PVC noted, no ST changes  10/15 5am EKG: A.fib w/ RVR, low amplitude EKG, + PVC,  no ST changes  Telemetry:  Telemetry was personally reviewed and demonstrates:  A.fib w/ RVR. Rate between 100-120. Up to 140s during 'bath'  Relevant CV Studies: N/A  Laboratory Data:  Chemistry Recent Labs  Lab 09/03/19 1304  NA 138  K 4.0  CL 106  CO2 22  GLUCOSE 117*  BUN 42*  CREATININE 1.32*  CALCIUM 9.1  GFRNONAA 56*  GFRAA >60  ANIONGAP 10    Recent Labs  Lab 09/03/19 1304 09/04/19 0051  PROT 7.2 6.4*  ALBUMIN 3.7 3.2*  AST 101* 93*  ALT 83* 81*  ALKPHOS 72 67  BILITOT 2.0* 1.6*   Hematology Recent Labs  Lab 09/03/19 1304  WBC 7.7  RBC 4.36  HGB 12.1*  HCT 38.8*  MCV 89.0  MCH 27.8  MCHC 31.2  RDW 14.2  PLT 195   Cardiac EnzymesNo results for input(s): TROPONINI in the last 168 hours. No results for input(s): TROPIPOC in the last 168 hours.  BNP Recent Labs  Lab 09/03/19 1304  BNP 667.1*    DDimer No results for input(s): DDIMER in the last 168 hours.  Radiology/Studies:  Dg Chest Port 1 View  Result Date: 09/03/2019 CLINICAL DATA:  Bilateral leg swelling for 4 days. EXAM: PORTABLE CHEST 1 VIEW COMPARISON:  None. FINDINGS: Cardiac silhouette is borderline enlarged. No mediastinal or hilar masses. Mild interstitial thickening noted most evident right peripheral lung base. Lungs otherwise clear. Suspect small right effusion. No pneumothorax. Skeletal structures are grossly intact. IMPRESSION: 1. Borderline cardiomegaly, mild interstitial thickening and probable small right effusion. Suspect mild congestive heart  failure. Electronically Signed   By: Lajean Manes M.D.   On: 09/03/2019 13:30    Assessment and Plan:   A.fib w/ RVR:  Currently rate barely controlled. On dilt drip. Unclear duration but per history appears to be chronic. Just had poor follow up in the past. Warm extremities, not in low output heart failure. Will need anti-coagulation prior to cardioversion. Currently on dilt drip - Start amiodarone gtt - C/w eliquis BID - C/w telemetry - Can TEE + cardiovert after 3 weeks of anticoagulation   Acute congestive heart failure Presenting with dyspnea, orthopnea and lower extremity edema. BNP 670. Albumin wnl. Describes no prior heart disease. TTE w/ EF 25-30%, no wall motion abnormality, dilated IVC. Per history, appears to have had long-term A.fib. Suspect tachycardia-induced cardiomyopathy given low risk factors for ischemic disease (hgb a1c 5.5, ldl 52) but do need ischemic work-up. Troponins 41->39 likely demand. Hypervolemic on exam. - F/u Echo - C/w furosemide 20mg  IV BID - Trend BM, Mag - Strict I&Os - Daily Weights - Fluid restriction - Keep O2 sat >88 - Replenish K as needed >4.0 - Can f/u outpatient for cath - Will need repeat echo after better control of arrhythmia to more accurately assess  For questions or updates, please contact [facility] Please consult [email or web address] for contact info under Cardiology/STEMI.   Signed, Gilberto Better, MD PGY-2, [facility] Pager: [phone] 09/04/2019 10:59 AM  Attending attestation to follow  I have seen and examined the patient along with Gilberto Better, MD.  I have reviewed the chart, notes and new data.  I agree with his note.  Key new complaints: severe exertional dyspnea and orthopnea, edema, rapidly improved with diuresis. Unaware of palpitations despite irregular rhythm and HR 140s at rest. Never had angina. Key examination changes: mild JVD (4-5 cm), 1 +residual ankle/pedal edema, rapid irregular rhythm, summation gallop  Key new findings / data:  LVEF 25-30%, global hypokinesis. Mildly dilated LV, severely dilated LA. AFib 140s on telemetry. Mildly elevated transaminases and mild AKI likely due to CHF.  PLAN: Differential diagnosis includes CAD (statistically most likely, but he has no angina and virtually no risk factors), idiopathic dilated CMP and tachycardia related CMP. The dilation of LV and LA suggests substantial chronicity. Rate control is critical, but will be challenging due to low BP. Rhythm control unlikely to be possible due to severely dilated LA. Beta blockers, amiodarone and digoxin preferred to calcium channel blockers. There is a chance we may have to consider AV node ablation. Similarly, BP may not allow RAAS inhibitors, at least not yet. Discussed need for anticoagulation, risks versus benefits. [employer] tomorrow.  Thurmon Fair, MD, [facility] [phone] 09/04/2019, 3:02 PM

## 2019-09-04 NOTE — Progress Notes (Signed)
2D Echocardiogram has been completed.  Jalin Alicea, RCS 

## 2019-09-04 NOTE — Discharge Instructions (Addendum)
Atrial Fibrillation ° °Atrial fibrillation is a type of heartbeat that is irregular or fast (rapid). If you have this condition, your heart beats without any order. This makes it hard for your heart to pump blood in a normal way. Having this condition gives you more risk for stroke, heart failure, and other heart problems. °Atrial fibrillation may start all of a sudden and then stop on its own, or it may become a long-lasting problem. °What are the causes? °This condition may be caused by heart conditions, such as: °· High blood pressure. °· Heart failure. °· Heart valve disease. °· Heart surgery. °Other causes include: °· Pneumonia. °· Obstructive sleep apnea. °· Lung cancer. °· Thyroid disease. °· Drinking too much alcohol. °Sometimes the cause is not known. °What increases the risk? °You are more likely to develop this condition if: °· You smoke. °· You are older. °· You have diabetes. °· You are overweight. °· You have a family history of this condition. °· You exercise often and hard. °What are the signs or symptoms? °Common symptoms of this condition include: °· A feeling like your heart is beating very fast. °· Chest pain. °· Feeling short of breath. °· Feeling light-headed or weak. °· Getting tired easily. °Follow these instructions at home: °Medicines °· Take over-the-counter and prescription medicines only as told by your doctor. °· If your doctor gives you a blood-thinning medicine, take it exactly as told. Taking too much of it can cause bleeding. Taking too little of it does not protect you against clots. Clots can cause a stroke. °Lifestyle ° °  ° °· Do not use any tobacco products. These include cigarettes, chewing tobacco, and e-cigarettes. If you need help quitting, ask your doctor. °· Do not drink alcohol. °· Do not drink beverages that have caffeine. These include coffee, soda, and tea. °· Follow diet instructions as told by your doctor. °· Exercise regularly as told by your doctor. °General  instructions °· If you have a condition that causes breathing to stop for a short period of time (apnea), treat it as told by your doctor. °· Keep a healthy weight. Do not use diet pills unless your doctor says they are safe for you. Diet pills may make heart problems worse. °· Keep all follow-up visits as told by your doctor. This is important. °Contact a doctor if: °· You notice a change in the speed, rhythm, or strength of your heartbeat. °· You are taking a blood-thinning medicine and you see more bruising. °· You get tired more easily when you move or exercise. °· You have a sudden change in weight. °Get help right away if: ° °· You have pain in your chest or your belly (abdomen). °· You have trouble breathing. °· You have blood in your vomit, poop, or pee (urine). °· You have any signs of a stroke. "BE FAST" is an easy way to remember the main warning signs: °? B - Balance. Signs are dizziness, sudden trouble walking, or loss of balance. °? E - Eyes. Signs are trouble seeing or a change in how you see. °? F - Face. Signs are sudden weakness or loss of feeling in the face, or the face or eyelid drooping on one side. °? A - Arms. Signs are weakness or loss of feeling in an arm. This happens suddenly and usually on one side of the body. °? S - Speech. Signs are sudden trouble speaking, slurred speech, or trouble understanding what people say. °? T - Time.   Time to call emergency services. Write down what time symptoms started. °· You have other signs of a stroke, such as: °? A sudden, very bad headache with no known cause. °? Feeling sick to your stomach (nausea). °? Throwing up (vomiting). °? Jerky movements you cannot control (seizure). °These symptoms may be an emergency. Do not wait to see if the symptoms will go away. Get medical help right away. Call your local emergency services (911 in the [place] Do not drive yourself to the hospital. °Summary °· Atrial fibrillation is a type of heartbeat that is irregular  or fast (rapid). °· You are at higher risk of this condition if you smoke, are older, have diabetes, or are overweight. °· Follow your doctor's instructions about medicines, diet, exercise, and follow-up visits. °· Get help right away if you think that you have signs of a stroke. °This information is not intended to replace advice given to you by your health care provider. Make sure you discuss any questions you have with your health care provider. °Document Released: 08/15/2008 Document Revised: 01/10/2018 Document Reviewed: 12/28/2017 °Elsevier Patient Education © 2020 [employer] ° °_______________________________________________________________________________________________________________________________________________________________________ ° °Information on my medicine - ELIQUIS® (apixaban) ° °Why was Eliquis® prescribed for you? °Eliquis® was prescribed for you to reduce the risk of a blood clot forming that can cause a stroke if you have a medical condition called atrial fibrillation (a type of irregular heartbeat). ° °What do You need to know about Eliquis® ? °Take your Eliquis® TWICE DAILY - one tablet in the morning and one tablet in the evening with or without food. If you have difficulty swallowing the tablet whole please discuss with your pharmacist how to take the medication safely. ° °Take Eliquis® exactly as prescribed by your doctor and DO NOT stop taking Eliquis® without talking to the doctor who prescribed the medication.  Stopping may increase your risk of developing a stroke.  Refill your prescription before you run out. ° °After discharge, you should have regular check-up appointments with your healthcare provider that is prescribing your Eliquis®.  In the future your dose may need to be changed if your kidney function or weight changes by a significant amount or as you get older. ° °What do you do if you miss a dose? °If you miss a dose, take it as soon as you remember on the same day  and resume taking twice daily.  Do not take more than one dose of ELIQUIS at the same time to make up a missed dose. ° °Important Safety Information °A possible side effect of Eliquis® is bleeding. You should call your healthcare provider right away if you experience any of the following: °? Bleeding from an injury or your nose that does not stop. °? Unusual colored urine (red or dark brown) or unusual colored stools (red or black). °? Unusual bruising for unknown reasons. °? A serious fall or if you hit your head (even if there is no bleeding). ° °Some medicines may interact with Eliquis® and might increase your risk of bleeding or clotting while on Eliquis®. To help avoid this, consult your healthcare provider or pharmacist prior to using any new prescription or non-prescription medications, including herbals, vitamins, non-steroidal anti-inflammatory drugs (NSAIDs) and supplements. ° °This website has more information on Eliquis® (apixaban): http://www.eliquis.com/eliquis/home °

## 2019-09-04 NOTE — Progress Notes (Signed)
ANTICOAGULATION CONSULT NOTE - Initial Consult  Pharmacy Consult for Eliquis Indication: atrial fibrillation  No Known Allergies  Patient Measurements: Height: 6\' 2"  (188 cm) Weight: 171 lb 8 oz (77.8 kg) IBW/kg (Calculated) : 82.2  Vital Signs: Temp: 97.9 F (36.6 C) (10/14 2329) Temp Source: Oral (10/14 2329) BP: 104/72 (10/14 2329) Pulse Rate: 81 (10/14 2329)  Labs: Recent Labs    09/03/19 1304  HGB 12.1*  HCT 38.8*  PLT 195  LABPROT 18.4*  INR 1.6*  CREATININE 1.32*    Estimated Creatinine Clearance: 60.6 mL/min (A) (by C-G formula based on SCr of 1.32 mg/dL (H)).   Medical History: Past Medical History:  Diagnosis Date  . Arthritis   . Hepatitis    hx of 35 years ago     Medications:  Medications Prior to Admission  Medication Sig Dispense Refill Last Dose  . Lysine 500 MG TABS Take 500 mg by mouth daily.     . Misc Natural Products (URINOZINC PO) Take 1 tablet by mouth daily.     . Multiple Vitamin (MULTIVITAMIN WITH MINERALS) TABS tablet Take 1 tablet by mouth daily.   09/03/2019 at Unknown time  . Multiple Vitamins-Minerals (EQ VISION FORMULA 50+ PO) Take 1 tablet by mouth daily.     . TURMERIC PO Take 1 tablet by mouth daily.   09/02/2019 at Unknown time  . aspirin EC 325 MG EC tablet Take 1 tablet (325 mg total) by mouth 2 (two) times daily after a meal. 30 tablet 0   . methocarbamol (ROBAXIN) 500 MG tablet Take 1 tablet (500 mg total) by mouth every 6 (six) hours as needed for muscle spasms. 40 tablet 0   . Misc Natural Products (GRAPE SEED COMPLEX PO) Take 1 tablet by mouth daily.     . Misc Natural Products (OSTEO BI-FLEX ADV TRIPLE ST PO) Take 1 tablet by mouth daily.     Marland Kitchen OVER THE COUNTER MEDICATION Take 1 tablet by mouth daily. BETA PROSTATE     . oxyCODONE-acetaminophen (ROXICET) 5-325 MG per tablet Take 1-2 tablets by mouth every 4 (four) hours as needed. 60 tablet 0    Scheduled:  . aspirin  81 mg Oral BID PC  . diltiazem      .  diltiazem      . diltiazem      . furosemide  20 mg Intravenous Q12H  . multivitamin with minerals  1 tablet Oral Daily  . potassium chloride  20 mEq Oral Once  . sodium chloride flush  3 mL Intravenous Q12H   Infusions:  . sodium chloride    . diltiazem (CARDIZEM) infusion      Assessment: 68yo male last saw MD 24yr ago, now c/o LE swelling and coughing, found to be in Afib w/ RVR, started on diltiazem and one dose of Eliquis given in ED, to continue Eliquis.   Plan:  Will continue Eliquis 5mg  PO BID and begin anticoag education.  Wynona Neat, PharmD, BCPS  09/04/2019,12:34 AM

## 2019-09-04 NOTE — H&P (Addendum)
History and Physical    Michael Noble YQM:[id number] DOB: May 12, 1951 DOA: 09/03/2019  Referring MD/NP/PA:   PCP: Patient, No Pcp Per   Patient coming from:  The patient is coming from home.  At baseline, pt is independent for most of ADL.        Chief Complaint: Bilateral leg edema, shortness of breath  HPI: Michael Noble is a 68 y.o. male with medical history significant of remote hepatitis, arthritis, who presents with bilateral leg edema and shortness of breath.  Patient states that he has been having bilateral leg edema in the past 4 days and progressively worsening shortness of breath.  His shortness of breath is exertional.  He has mild dry cough, but no chest pain, fever or chills.  He has intermittent palpitation.  Denies nausea, vomiting, diarrhea, abdominal pain, symptoms of UTI.  No unilateral weakness.  Patient states that he did not see doctor in the past several years.  ED Course: pt was found to have BNP 660, negative COVID-19 test, AKI with creatinine 1.32, BUN 42 (no baseline creatinine available), abnormal liver function (ALP 72, AST 101, ALT 83, total bilirubin 2.0), temperature normal, blood pressure 104/72, current heart rate 81, oxygen saturation 96% on room air.  Chest x-ray showed cardiomegaly, interstitial pulmonary edema.  Patient is admitted to SDU bed as inpatient.  Review of Systems:   General: no fevers, chills, no body weight gain,  has fatigue HEENT: no blurry vision, hearing changes or sore throat Respiratory: has dyspnea, coughing, no wheezing CV: no chest pain, has palpitations GI: no nausea, vomiting, abdominal pain, diarrhea, constipation GU: no dysuria, burning on urination, increased urinary frequency, hematuria  Ext: has leg edema Neuro: no unilateral weakness, numbness, or tingling, no vision change or hearing loss Skin: no rash, no skin tear. MSK: No muscle spasm, no deformity, no limitation of range of movement in spin Heme: No easy  bruising.  Travel history: No recent long distant travel.  Allergy: No Known Allergies  Past Medical History:  Diagnosis Date  . Arthritis   . Hepatitis    hx of 35 years ago     Past Surgical History:  Procedure Laterality Date  . TOTAL HIP ARTHROPLASTY Left 06/25/2015   Procedure: LEFT TOTAL HIP ARTHROPLASTY ANTERIOR APPROACH;  Surgeon: Kathryne Hitch, MD;  Location: [facility];  Service: Orthopedics;  Laterality: Left;    Social History:  reports that he has quit smoking. He has quit using smokeless tobacco.  His smokeless tobacco use included chew. He reports that he does not drink alcohol or use drugs.  Family History:  Family History  Problem Relation Age of Onset  . Heart disease Mother   . Atrial fibrillation Father      Prior to Admission medications   Medication Sig Start Date End Date Taking? Authorizing Provider  Lysine 500 MG TABS Take 500 mg by mouth daily.   Yes [provider]  Misc Natural Products (URINOZINC PO) Take 1 tablet by mouth daily.   Yes [provider]  Multiple Vitamin (MULTIVITAMIN WITH MINERALS) TABS tablet Take 1 tablet by mouth daily.   Yes [provider]  Multiple Vitamins-Minerals (EQ VISION FORMULA 50+ PO) Take 1 tablet by mouth daily.   Yes [provider]  TURMERIC PO Take 1 tablet by mouth daily.   Yes [provider]  aspirin EC 325 MG EC tablet Take 1 tablet (325 mg total) by mouth 2 (two) times daily after a meal. 06/26/15  Kathryne Hitch, MD  methocarbamol (ROBAXIN) 500 MG tablet Take 1 tablet (500 mg total) by mouth every 6 (six) hours as needed for muscle spasms. 06/26/15   Kathryne Hitch, MD  Misc Natural Products (GRAPE SEED COMPLEX PO) Take 1 tablet by mouth daily.    [provider]  Misc Natural Products (OSTEO BI-FLEX ADV TRIPLE ST PO) Take 1 tablet by mouth daily.    [provider]  OVER THE COUNTER MEDICATION Take 1 tablet by mouth daily. BETA  PROSTATE    [provider]  oxyCODONE-acetaminophen (ROXICET) 5-325 MG per tablet Take 1-2 tablets by mouth every 4 (four) hours as needed. 06/26/15   Kathryne Hitch, MD    Physical Exam: Vitals:   09/03/19 2100 09/03/19 2130 09/03/19 2328 09/03/19 2329  BP: 99/66 102/68  104/72  Pulse: (!) 53 (!) 103  81  Resp: (!) 22 13  18   Temp:    97.9 F (36.6 C)  TempSrc:    Oral  SpO2: 99% 96%  96%  Weight:   77.8 kg   Height:   6\' 2"  (1.88 m)    General: Not in acute distress HEENT:       Eyes: PERRL, EOMI, no scleral icterus.       ENT: No discharge from the ears and nose, no pharynx injection, no tonsillar enlargement.        Neck: Positive JVD, no bruit, no mass felt. Heme: No neck lymph node enlargement. Cardiac: S1/S2, irregularly irregular rhythm, no murmurs, No gallops or rubs. Respiratory: has fine crackle at base GI: Soft, nondistended, nontender, no rebound pain, no organomegaly, BS present. GU: No hematuria Ext: has 1+ pitting leg edema bilaterally. 2+DP/PT pulse bilaterally. Musculoskeletal: No joint deformities, No joint redness or warmth, no limitation of ROM in spin. Skin: No rashes.  Neuro: Alert, oriented X3, cranial nerves II-XII grossly intact, moves all extremities normally.  Psych: Patient is not psychotic, no suicidal or hemocidal ideation.  Labs on Admission: I have personally reviewed following labs and imaging studies  CBC: Recent Labs  Lab 09/03/19 1304  WBC 7.7  NEUTROABS 5.3  HGB 12.1*  HCT 38.8*  MCV 89.0  PLT 195   Basic Metabolic Panel: Recent Labs  Lab 09/03/19 1304  NA 138  K 4.0  CL 106  CO2 22  GLUCOSE 117*  BUN 42*  CREATININE 1.32*  CALCIUM 9.1   GFR: Estimated Creatinine Clearance: 60.6 mL/min (A) (by C-G formula based on SCr of 1.32 mg/dL (H)). Liver Function Tests: Recent Labs  Lab 09/03/19 1304  AST 101*  ALT 83*  ALKPHOS 72  BILITOT 2.0*  PROT 7.2  ALBUMIN 3.7   No results for input(s): LIPASE,  AMYLASE in the last 168 hours. No results for input(s): AMMONIA in the last 168 hours. Coagulation Profile: Recent Labs  Lab 09/03/19 1304  INR 1.6*   Cardiac Enzymes: No results for input(s): CKTOTAL, CKMB, CKMBINDEX, TROPONINI in the last 168 hours. BNP (last 3 results) No results for input(s): PROBNP in the last 8760 hours. HbA1C: No results for input(s): HGBA1C in the last 72 hours. CBG: No results for input(s): GLUCAP in the last 168 hours. Lipid Profile: No results for input(s): CHOL, HDL, LDLCALC, TRIG, CHOLHDL, LDLDIRECT in the last 72 hours. Thyroid Function Tests: No results for input(s): TSH, T4TOTAL, FREET4, T3FREE, THYROIDAB in the last 72 hours. Anemia Panel: No results for input(s): VITAMINB12, FOLATE, FERRITIN, TIBC, IRON, RETICCTPCT in the last 72 hours. Urine  analysis: No results found for: COLORURINE, APPEARANCEUR, LABSPEC, PHURINE, GLUCOSEU, HGBUR, BILIRUBINUR, KETONESUR, PROTEINUR, UROBILINOGEN, NITRITE, LEUKOCYTESUR Sepsis Labs: @LABRCNTIP (procalcitonin:4,lacticidven:4) ) Recent Results (from the past 240 hour(s))  SARS CORONAVIRUS 2 (TAT 6-24 HRS) Nasopharyngeal Nasopharyngeal Swab     Status: None   Collection Time: 09/03/19  1:04 PM   Specimen: Nasopharyngeal Swab  Result Value Ref Range Status   SARS Coronavirus 2 NEGATIVE NEGATIVE Final    Comment: (NOTE) SARS-CoV-2 target nucleic acids are NOT DETECTED. The SARS-CoV-2 RNA is generally detectable in upper and lower respiratory specimens during the acute phase of infection. Negative results do not preclude SARS-CoV-2 infection, do not rule out co-infections with other pathogens, and should not be used as the sole basis for treatment or other patient management decisions. Negative results must be combined with clinical observations, patient history, and epidemiological information. The expected result is Negative. Fact Sheet for Patients: SugarRoll.be Fact Sheet for  Healthcare Providers: https://www.woods-mathews.com/ This test is not yet approved or cleared by the [place] FDA and  has been authorized for detection and/or diagnosis of SARS-CoV-2 by FDA under an Emergency Use Authorization (EUA). This EUA will remain  in effect (meaning this test can be used) for the duration of the COVID-19 declaration under Section 56 4(b)(1) of the Act, 21 U.S.C. section 360bbb-3(b)(1), unless the authorization is terminated or revoked sooner. Performed at [facility], [place] [address], [place], [place] [address]      Radiological Exams on Admission: Dg Chest Port 1 View  Result Date: 09/03/2019 CLINICAL DATA:  Bilateral leg swelling for 4 days. EXAM: PORTABLE CHEST 1 VIEW COMPARISON:  None. FINDINGS: Cardiac silhouette is borderline enlarged. No mediastinal or hilar masses. Mild interstitial thickening noted most evident right peripheral lung base. Lungs otherwise clear. Suspect small right effusion. No pneumothorax. Skeletal structures are grossly intact. IMPRESSION: 1. Borderline cardiomegaly, mild interstitial thickening and probable small right effusion. Suspect mild congestive heart failure. Electronically Signed   By: Lajean Manes M.D.   On: 09/03/2019 13:30     EKG: Independently reviewed.  Atrial fibrillation, QTc 475, low voltage, RAD, poor R wave progression, occasional PVC   Assessment/Plan Principal Problem:   Atrial fibrillation with RVR (HCC) Active Problems:   Acute CHF (congestive heart failure) (HCC)   AKI (acute kidney injury) (HCC)   Abnormal LFTs   Atrial fibrillation with RVR ([place]): Possibly triggered by acute onset CHF.  Initial heart rate up to 160s, improved to 80s currently on Cardizem drip.  No chest pain currently. CHA2DS2-VASc Score is 2, needs oral anticoagulation. Pt was started on Eliquis in ED. -admit to SUD as inpt -continue cardizem gtt -continue home ASA 81 mg daily -pt was started with Eliquis,  will continue - check TSH -card consult is requested via Epic  Acute CHF (congestive heart failure): Patient has a bilateral leg edema, pulmonary edema chest x-ray, elevated BNP, clinically consistent with new onset acute CHF, not sure which type of CHF.  Will get 2D echo for further evaluation. -Lasix 20 mg bid by IV (p[t received 40 mg of lasix in ED) - give 20 mEq of Kdur now (K=4.0) - check Mg -trop x2 -2d echo -Daily weights -strict I/O's -Low salt diet -Fluid restriction  Abnormal LFTs: ALP 72, AST 101, ALT 83, total bilirubin 2.0. pt states that he had hepatitis more than 40 years ago.  He is not sure which type of hepatitis. -will get hepatitis panel -HIV antibody -Avoid using liver toxic medications, such as  Tylenol  AKI (acute kidney injury) [facility](HCC): Cre 1.32 and BUN 42.  May be due to cardiorenal syndrome -Avoid using renal toxic medications -Monitor renal function closely    Inpatient status:  # Patient requires inpatient status due to high intensity of service, high risk for further deterioration and high frequency of surveillance required.  I certify that at the point of admission it is my clinical judgment that the patient will require inpatient hospital care spanning beyond 2 midnights from the point of admission.  . Now patient has presenting with new onset acute CHF, A fib with RVR, AKI and abnormal liver function . The worrisome physical exam findings include bilateral 1+ leg edema, positive JVD, fine crackles on auscultation . The initial radiographic and laboratory data are worrisome because of elevated BNP, pulmonary edema on chest x-ray, AKI . Current medical needs: please see my assessment and plan . Predictability of an adverse outcome (risk): Patient does not have multiple comorbidities, but he presents with several acute issues, including new onset acute CHF, A fib with RVR, AKI and abnormal liver function.  His presentation is highly complicated.  Will need  to have further work-up for etiology by cardiology.  Patient is at high risk for deteriorating.  Will need to be treated in hospital for at least 2 days.      DVT ppx: on eliquis Code Status: Full code Family Communication: None at bed side.  Disposition Plan:  Anticipate discharge back to previous home environment Consults called:  none Admission status:   SDU/inpation       Date of Service 09/04/2019    Lorretta HarpXilin Diarra Kos [facility]   If 7PM-7AM, please contact night-coverage [email or web address] Password [email or web address] 09/04/2019, 12:41 AM

## 2019-09-04 NOTE — Care Management (Signed)
[id number] 09-04-19 CM submitted benefits check for Eliquis. CM will make patient aware of cost once completed. Bethena Roys, RN,BSN Case Manager [phone]

## 2019-09-04 NOTE — Progress Notes (Signed)
PROGRESS NOTE    Michael Noble  KDX:[id number] DOB: [birth year] DOA: 09/03/2019 PCP: Patient, No Pcp Per   Brief Narrative:  Michael Noble is a 68 y.o. male with medical history significant of remote hepatitis, arthritis, who presented to ED on 09/03/2019 with bilateral leg edema and shortness of breath of 4 days duration.  This is associated with mild dry cough but no chest pain, fever or chills.  No sick contact.  No other complaint.  Upon arrival to ED, pt was found to have BNP 660, negative COVID-19 test, AKI with creatinine 1.32, abnormal liver function (ALP 72, AST 101, ALT 83, total bilirubin 2.0), temperature normal, blood pressure 104/72, and atrial fibrillation with RVR.  He was started on Cardizem drip and admitted under hospitalist service.  Cardiology was consulted.  He was also in acute congestive heart failure.  Was a started on Lasix.    Assessment & Plan:   Principal Problem:   Atrial fibrillation with RVR (HCC) Active Problems:   Acute CHF (congestive heart failure) (HCC)   AKI (acute kidney injury) (HCC)   Abnormal LFTs  Acute pulmonary edema/acute congestive heart failure, type unknown: No history of heart failure in the past.  This could very well be due to atrial fibrillation with RVR.  Echo pending.  Continue diuretics with Lasix IV 20 mg twice daily.  Strict I's and O's and sodium restricted diet with fluid restriction.  Cardiology to see today.  New onset atrial fibrillation with RVR: No history of fib in the past.  Currently remains on Cardizem drip but continues to have rates around 120.  No chest pain or shortness of breath.  Continue this management.  Cardiology to see.  Further management per them.  History of remote hepatitis C/elevated LFTs: We will repeat LFTs today.  No abdominal pain.  Viral hepatitis panel and HIV pending.  Obtain ultrasound abdomen.  ?  AKI: Came in with creatinine 1.32.  Baseline unknown.  Repeat labs today.  Avoid nephrotoxic agents  in the meantime.  DVT prophylaxis: Eliquis Code Status: Full code Family Communication:  None present at bedside.  Plan of care discussed with patient in length and he verbalized understanding and agreed with it. Disposition Plan: TBD  Estimated body mass index is 21.99 kg/m as calculated from the following:   Height as of this encounter: 6\' 2"  (1.88 m).   Weight as of this encounter: 77.7 kg.      Nutritional status:               Consultants:   Cardiology  Procedures:   None  Antimicrobials:   None   Subjective: Patient seen and examined.  Feels much better.  No more shortness of breath.  No chest pain.  Objective: Vitals:   09/03/19 2130 09/03/19 2328 09/03/19 2329 09/04/19 0541  BP: 102/68  104/72 91/76  Pulse: (!) 103  81 (!) 133  Resp: 13  18 18   Temp:   97.9 F (36.6 C) 97.8 F (36.6 C)  TempSrc:   Oral Oral  SpO2: 96%  96% 92%  Weight:  77.8 kg  77.7 kg  Height:  6\' 2"  (1.88 m)      Intake/Output Summary (Last 24 hours) at 09/04/2019 1127 Last data filed at 09/04/2019 0910 Gross per 24 hour  Intake 698.31 ml  Output 1875 ml  Net -1176.69 ml   Filed Weights   09/03/19 1237 09/03/19 2328 09/04/19 0541  Weight: 85 kg 77.8 kg 77.7 kg  Examination:  General exam: Appears calm and comfortable  Respiratory system: Bibasilar fine crackles. Respiratory effort normal. Cardiovascular system: S1 & S2 heard, irregularly irregular rate and rhythm. No JVD, murmurs, rubs, gallops or clicks. No pedal edema. Gastrointestinal system: Abdomen is nondistended, soft and nontender. No organomegaly or masses felt. Normal bowel sounds heard. Central nervous system: Alert and oriented. No focal neurological deficits. Extremities: Symmetric 5 x 5 power. Skin: No rashes, lesions or ulcers Psychiatry: Judgement and insight appear normal. Mood & affect appropriate.    Data Reviewed: I have personally reviewed following labs and imaging studies  CBC:  Recent Labs  Lab 09/03/19 1304  WBC 7.7  NEUTROABS 5.3  HGB 12.1*  HCT 38.8*  MCV 89.0  PLT 937   Basic Metabolic Panel: Recent Labs  Lab 09/03/19 1304 09/04/19 0223  NA 138  --   K 4.0  --   CL 106  --   CO2 22  --   GLUCOSE 117*  --   BUN 42*  --   CREATININE 1.32*  --   CALCIUM 9.1  --   MG  --  1.8   GFR: Estimated Creatinine Clearance: 60.5 mL/min (A) (by C-G formula based on SCr of 1.32 mg/dL (H)). Liver Function Tests: Recent Labs  Lab 09/03/19 1304 09/04/19 0051  AST 101* 93*  ALT 83* 81*  ALKPHOS 72 67  BILITOT 2.0* 1.6*  PROT 7.2 6.4*  ALBUMIN 3.7 3.2*   No results for input(s): LIPASE, AMYLASE in the last 168 hours. No results for input(s): AMMONIA in the last 168 hours. Coagulation Profile: Recent Labs  Lab 09/03/19 1304  INR 1.6*   Cardiac Enzymes: No results for input(s): CKTOTAL, CKMB, CKMBINDEX, TROPONINI in the last 168 hours. BNP (last 3 results) No results for input(s): PROBNP in the last 8760 hours. HbA1C: Recent Labs    09/04/19 0223  HGBA1C 5.5   CBG: No results for input(s): GLUCAP in the last 168 hours. Lipid Profile: Recent Labs    09/04/19 0223  CHOL 83  HDL 23*  LDLCALC 52  TRIG 41  CHOLHDL 3.6   Thyroid Function Tests: Recent Labs    09/04/19 0051  TSH 1.482   Anemia Panel: No results for input(s): VITAMINB12, FOLATE, FERRITIN, TIBC, IRON, RETICCTPCT in the last 72 hours. Sepsis Labs: No results for input(s): PROCALCITON, LATICACIDVEN in the last 168 hours.  Recent Results (from the past 240 hour(s))  SARS CORONAVIRUS 2 (TAT 6-24 HRS) Nasopharyngeal Nasopharyngeal Swab     Status: None   Collection Time: 09/03/19  1:04 PM   Specimen: Nasopharyngeal Swab  Result Value Ref Range Status   SARS Coronavirus 2 NEGATIVE NEGATIVE Final    Comment: (NOTE) SARS-CoV-2 target nucleic acids are NOT DETECTED. The SARS-CoV-2 RNA is generally detectable in upper and lower respiratory specimens during the acute phase  of infection. Negative results do not preclude SARS-CoV-2 infection, do not rule out co-infections with other pathogens, and should not be used as the sole basis for treatment or other patient management decisions. Negative results must be combined with clinical observations, patient history, and epidemiological information. The expected result is Negative. Fact Sheet for Patients: SugarRoll.be Fact Sheet for Healthcare Providers: https://www.woods-mathews.com/ This test is not yet approved or cleared by the [place] FDA and  has been authorized for detection and/or diagnosis of SARS-CoV-2 by FDA under an Emergency Use Authorization (EUA). This EUA will remain  in effect (meaning this test can be used) for the duration of  the COVID-19 declaration under Section 56 4(b)(1) of the Act, 21 U.S.C. section 360bbb-3(b)(1), unless the authorization is terminated or revoked sooner. Performed at [facility], [address], [place], [place] [address]       Radiology Studies: Dg Chest Port 1 View  Result Date: 09/03/2019 CLINICAL DATA:  Bilateral leg swelling for 4 days. EXAM: PORTABLE CHEST 1 VIEW COMPARISON:  None. FINDINGS: Cardiac silhouette is borderline enlarged. No mediastinal or hilar masses. Mild interstitial thickening noted most evident right peripheral lung base. Lungs otherwise clear. Suspect small right effusion. No pneumothorax. Skeletal structures are grossly intact. IMPRESSION: 1. Borderline cardiomegaly, mild interstitial thickening and probable small right effusion. Suspect mild congestive heart failure. Electronically Signed   By: Amie Portland M.D.   On: 09/03/2019 13:30    Scheduled Meds: . apixaban  5 mg Oral BID  . aspirin  81 mg Oral BID PC  . furosemide  20 mg Intravenous Q12H  . multivitamin with minerals  1 tablet Oral Daily  . sodium chloride flush  3 mL Intravenous Q12H   Continuous Infusions: . sodium  chloride    . diltiazem (CARDIZEM) infusion 12.5 mg/hr (09/04/19 0234)     LOS: 1 day   Time spent: 32 minutes   Hughie Closs, MD [facility]  09/04/2019, 11:27 AM   To contact the attending provider between 7A-7P or the covering provider during after hours 7P-7A, please log into the web site [email or web address] and use password [email or web address].

## 2019-09-04 NOTE — Care Management (Addendum)
Pt. Has no pharmacy benfits with [employer],he has a supplemental (Plan G.) A and B. Per [employer].

## 2019-09-05 ENCOUNTER — Inpatient Hospital Stay (HOSPITAL_COMMUNITY): Payer: Medicare Other

## 2019-09-05 DIAGNOSIS — I509 Heart failure, unspecified: Secondary | ICD-10-CM

## 2019-09-05 LAB — CBC WITH DIFFERENTIAL/PLATELET
Abs Immature Granulocytes: 0.02 10*3/uL (ref 0.00–0.07)
Basophils Absolute: 0 10*3/uL (ref 0.0–0.1)
Basophils Relative: 0 %
Eosinophils Absolute: 0 10*3/uL (ref 0.0–0.5)
Eosinophils Relative: 1 %
HCT: 35.4 % — ABNORMAL LOW (ref 39.0–52.0)
Hemoglobin: 11.8 g/dL — ABNORMAL LOW (ref 13.0–17.0)
Immature Granulocytes: 0 %
Lymphocytes Relative: 17 %
Lymphs Abs: 1.2 10*3/uL (ref 0.7–4.0)
MCH: 28.9 pg (ref 26.0–34.0)
MCHC: 33.3 g/dL (ref 30.0–36.0)
MCV: 86.6 fL (ref 80.0–100.0)
Monocytes Absolute: 0.8 10*3/uL (ref 0.1–1.0)
Monocytes Relative: 11 %
Neutro Abs: 5 10*3/uL (ref 1.7–7.7)
Neutrophils Relative %: 71 %
Platelets: 178 10*3/uL (ref 150–400)
RBC: 4.09 MIL/uL — ABNORMAL LOW (ref 4.22–5.81)
RDW: 14.3 % (ref 11.5–15.5)
WBC: 7.1 10*3/uL (ref 4.0–10.5)
nRBC: 0 % (ref 0.0–0.2)

## 2019-09-05 LAB — NM MYOCAR MULTI W/SPECT W/WALL MOTION / EF
Estimated workload: 1 METS
MPHR: 154 {beats}/min
Peak HR: 134 {beats}/min
Percent HR: 87 %
Rest HR: 122 {beats}/min

## 2019-09-05 LAB — BASIC METABOLIC PANEL
Anion gap: 9 (ref 5–15)
BUN: 36 mg/dL — ABNORMAL HIGH (ref 8–23)
CO2: 25 mmol/L (ref 22–32)
Calcium: 8.6 mg/dL — ABNORMAL LOW (ref 8.9–10.3)
Chloride: 102 mmol/L (ref 98–111)
Creatinine, Ser: 1.46 mg/dL — ABNORMAL HIGH (ref 0.61–1.24)
GFR calc Af Amer: 57 mL/min — ABNORMAL LOW (ref >60)
GFR calc non Af Amer: 49 mL/min — ABNORMAL LOW (ref >60)
Glucose, Bld: 109 mg/dL — ABNORMAL HIGH (ref 70–99)
Potassium: 3.9 mmol/L (ref 3.5–5.1)
Sodium: 136 mmol/L (ref 135–145)

## 2019-09-05 LAB — MAGNESIUM: Magnesium: 1.8 mg/dL (ref 1.7–2.4)

## 2019-09-05 MED ORDER — TECHNETIUM TC 99M TETROFOSMIN IV KIT
30.0000 | PACK | Freq: Once | INTRAVENOUS | Status: AC | PRN
  Administered 2019-09-05: 10:00:00 30 via INTRAVENOUS

## 2019-09-05 MED ORDER — AMIODARONE HCL IN DEXTROSE 360-4.14 MG/200ML-% IV SOLN
60.0000 mg/h | INTRAVENOUS | Status: AC
  Administered 2019-09-05: 60 mg/h via INTRAVENOUS

## 2019-09-05 MED ORDER — REGADENOSON 0.4 MG/5ML IV SOLN
INTRAVENOUS | Status: AC
  Filled 2019-09-05: qty 5

## 2019-09-05 MED ORDER — TECHNETIUM TC 99M TETROFOSMIN IV KIT
10.0000 | PACK | Freq: Once | INTRAVENOUS | Status: AC | PRN
  Administered 2019-09-05: 10 via INTRAVENOUS

## 2019-09-05 MED ORDER — DIGOXIN 125 MCG PO TABS
0.1250 mg | ORAL_TABLET | Freq: Every day | ORAL | Status: DC
  Administered 2019-09-05 – 2019-09-07 (×3): 0.125 mg via ORAL
  Filled 2019-09-05 (×3): qty 1

## 2019-09-05 MED ORDER — REGADENOSON 0.4 MG/5ML IV SOLN
0.4000 mg | Freq: Once | INTRAVENOUS | Status: AC
  Administered 2019-09-05: 0.4 mg via INTRAVENOUS

## 2019-09-05 MED ORDER — AMIODARONE HCL IN DEXTROSE 360-4.14 MG/200ML-% IV SOLN
30.0000 mg/h | INTRAVENOUS | Status: DC
  Administered 2019-09-05 – 2019-09-07 (×4): 30 mg/h via INTRAVENOUS
  Filled 2019-09-05 (×4): qty 200

## 2019-09-05 MED ORDER — CARVEDILOL 3.125 MG PO TABS
3.1250 mg | ORAL_TABLET | Freq: Two times a day (BID) | ORAL | Status: DC
  Administered 2019-09-05: 3.125 mg via ORAL
  Filled 2019-09-05: qty 1

## 2019-09-05 NOTE — Care Management (Signed)
1337 09-05-19 CM discussed with patient regarding Eliquis Cost without insurance.Patient is without Part D Rx coverage. CM will provide patient with a 30 day free card, however will need assistance post the 30 days. CM will discuss with wife the patient assistance program via [employer] and the [employer]. CM will continue to follow for additional transition of care needs. Bethena Roys, RN,BSN Case Manager [phone]

## 2019-09-05 NOTE — Progress Notes (Addendum)
Progress Note  Patient Name: Michael Noble Date of Encounter: 09/05/2019  Primary Cardiologist: No primary care provider on file.   Subjective  O/N event: Persistent dry cough overnight. Chest X-ray performed showing pulmonary edema and vascular congestion. No lobar consolidation.  Michael Noble was examined and evaluated at bedside this AM. He states he had a rough night due to persistent dry cough and was wondering the results of his X-ray. Discussed no new findings and continued pulmonary edema requiring further diuresis. Discussed plan for stress test to assess for perfusion defect. Michael Noble expressed understanding. Denies any chest pain, palpitations, light-headedness.  Inpatient Medications    Scheduled Meds: . apixaban  5 mg Oral BID  . aspirin  81 mg Oral BID PC  . furosemide  20 mg Intravenous Q12H  . multivitamin with minerals  1 tablet Oral Daily  . sodium chloride flush  3 mL Intravenous Q12H   Continuous Infusions: . sodium chloride    . amiodarone 30 mg/hr (09/05/19 0232)   PRN Meds: sodium chloride, ondansetron (ZOFRAN) IV, sodium chloride flush   Vital Signs    Vitals:   09/04/19 2045 09/05/19 0045 09/05/19 0447 09/05/19 0652  BP:   109/89   Pulse: (!) 137 (!) 128 74   Resp:   17   Temp:   97.8 F (36.6 C)   TempSrc:   Oral   SpO2:   94%   Weight:    77.8 kg  Height:        Intake/Output Summary (Last 24 hours) at 09/05/2019 0810 Last data filed at 09/05/2019 0612 Gross per 24 hour  Intake 1147.13 ml  Output 1775 ml  Net -627.87 ml   Filed Weights   09/03/19 2328 09/04/19 0541 09/05/19 0652  Weight: 77.8 kg 77.7 kg 77.8 kg    Telemetry    A.fib w/ RVR with HR ~100-110 until about 6am when it slowly went up to >120s - Personally Reviewed  ECG    10/15 5am EKG: A.fib w/ RVR, low amplitude EKG, + PVC, no ST changes - Personally Reviewed  Physical Exam   Gen: Well-developed, well nourished, NAD HEENT: NCAT head, hearing intact, EOMI,  PERRL, MMM Neck: supple, ROM intact, no JVD CV: RRR, S1, S2 normal, No rubs, no murmurs, no gallops Pulm: CTAB, mild bibasilar rales Abd: Soft, BS+, NTND, No rebound, no guarding Extm: ROM intact, Peripheral pulses intact, trace ankle pitting edema Skin: Dry, Warm, normal turgor  Labs    Chemistry Recent Labs  Lab 09/03/19 1304 09/04/19 0051 09/05/19 0306  NA 138  --  136  K 4.0  --  3.9  CL 106  --  102  CO2 22  --  25  GLUCOSE 117*  --  109*  BUN 42*  --  36*  CREATININE 1.32*  --  1.46*  CALCIUM 9.1  --  8.6*  PROT 7.2 6.4*  --   ALBUMIN 3.7 3.2*  --   AST 101* 93*  --   ALT 83* 81*  --   ALKPHOS 72 67  --   BILITOT 2.0* 1.6*  --   GFRNONAA 56*  --  49*  GFRAA >60  --  57*  ANIONGAP 10  --  9     Hematology Recent Labs  Lab 09/03/19 1304 09/05/19 0306  WBC 7.7 7.1  RBC 4.36 4.09*  HGB 12.1* 11.8*  HCT 38.8* 35.4*  MCV 89.0 86.6  MCH 27.8 28.9  MCHC 31.2 33.3  RDW 14.2 14.3  PLT 195 178    Cardiac EnzymesNo results for input(s): TROPONINI in the last 168 hours. No results for input(s): TROPIPOC in the last 168 hours.   BNP Recent Labs  Lab 09/03/19 1304  BNP 667.1*     DDimer No results for input(s): DDIMER in the last 168 hours.   Radiology    Dg Chest 2 View  Result Date: 09/05/2019 CLINICAL DATA:  Cough EXAM: CHEST - 2 VIEW COMPARISON:  09/03/2019 FINDINGS: Small bilateral pleural effusions. Cardiomegaly with vascular congestion and interstitial pulmonary edema, increased compared to prior. Patchy atelectasis at the bases IMPRESSION: Cardiomegaly with vascular congestion and increased interstitial pulmonary edema. Small pleural effusions Electronically Signed   By: Jasmine Pang M.D.   On: 09/05/2019 01:37   Dg Chest Port 1 View  Result Date: 09/03/2019 CLINICAL DATA:  Bilateral leg swelling for 4 days. EXAM: PORTABLE CHEST 1 VIEW COMPARISON:  None. FINDINGS: Cardiac silhouette is borderline enlarged. No mediastinal or hilar masses. Mild  interstitial thickening noted most evident right peripheral lung base. Lungs otherwise clear. Suspect small right effusion. No pneumothorax. Skeletal structures are grossly intact. IMPRESSION: 1. Borderline cardiomegaly, mild interstitial thickening and probable small right effusion. Suspect mild congestive heart failure. Electronically Signed   By: Amie Portland M.D.   On: 09/03/2019 13:30    Cardiac Studies   TTE 09/04/19 IMPRESSIONS  1. Left ventricular ejection fraction, by visual estimation, is 25 to 30%. The left ventricle has severely decreased function. Mildly increased left ventricular size. There is no left ventricular hypertrophy.  2. Left ventricular diastolic Doppler parameters are indeterminate pattern of LV diastolic filling.  3. Global right ventricle has normal systolic function.The right ventricular size is normal. No increase in right ventricular wall thickness.  4. Left atrial size was severely dilated.  5. Right atrial size was moderately dilated.  6. The mitral valve is normal in structure. Mild mitral valve regurgitation. No evidence of mitral stenosis.  7. The tricuspid valve is normal in structure. Tricuspid valve regurgitation moderate.  8. The aortic valve is normal in structure. Aortic valve regurgitation is mild to moderate by color flow Doppler. Structurally normal aortic valve, with no evidence of sclerosis or stenosis.  9. The pulmonic valve was normal in structure. Pulmonic valve regurgitation is not visualized by color flow Doppler. 10. Mildly elevated pulmonary artery systolic pressure. 11. The inferior vena cava is dilated in size with <50% respiratory variability, suggesting right atrial pressure of 15 mmHg.  Patient Profile     68 y.o. male w/ no significant PMH present w/ dyspnea and lower extremity edema found to have new acute systolic HF and A.fib w/ RVR  Assessment & Plan    A.fib w/ RVR:  Currently rate >120. On amio drip. HR unable to be  controlled on amio alone. CHAD-VASC score of 2 due to age and heart failure - C/w amiodarone gtt - Start metoprolol - C/w eliquis BID - C/w telemetry - Can TEE + cardiovert after 3 weeks of anticoagulation   Acute congestive heart failure Presenting with dyspnea, orthopnea and lower extremity edema. BNP 670. Albumin wnl. TTE w/ EF 25-30%, no wall motion abnormality, dilated IVC. Per history, appears to have had long-term A.fib. Suspect tachycardia-induced cardiomyopathy given low risk factors for ischemic disease (hgb a1c 5.5, ldl 52) but do need ischemic work-up. Less hypervolemic on exam. 1.7L output overnight. Weight unchanged @ 77.8kg. Creatinine worsening 1.32->1.46 - F/u myoview - May be unable to diurese further due to worsening renal  fx. - Trend BM, Mag - Strict I&Os - Daily Weights - Fluid restriction - Keep O2 sat >88 - Replenish K as needed >4.0 - Will need repeat echo after better control of arrhythmia to more accurately assess cardiac fx    For questions or updates, please contact [place] Please consult [email or web address] for contact info under Cardiology/STEMI.     Signed, Gilberto Better, MD PGY-2, [facility] Pager: [phone] 09/05/2019, 8:10 AM    Attending attestation to follow  I have seen and examined the patient along with Gilberto Better, MD.  I have reviewed the chart, notes and new data.  I agree with PA/NP's note.  Key new complaints: continues to have orthopnea and cough Key examination changes: Still with significant RVR despite IV amio and PO dig load. Fair diuresis, -2L since admission. Key new findings / data: K 3.9, creat slightly higher, but BUN lower. N TSH. Reviewed the nuclear images. Homogeneous perfusion pattern.  PLAN: Continue efforts to achieve rate control, diuresis. BP a little higher, will add low dose carvedilol. Will increase the amio infusion rate for 6 hours.  Sanda Klein, MD, [place] [phone] 09/05/2019,  12:02 PM

## 2019-09-05 NOTE — Progress Notes (Signed)
PROGRESS NOTE    Michael Noble  TIW:[id number] DOB: [birth year] DOA: 09/03/2019 PCP: Patient, No Pcp Per   Brief Narrative:  Michael Noble is a 68 y.o. male with medical history significant of remote hepatitis, arthritis, who presented to ED on 09/03/2019 with bilateral leg edema and shortness of breath of 4 days duration.  This is associated with mild dry cough but no chest pain, fever or chills.  No sick contact.  No other complaint.  Upon arrival to ED, pt was found to have BNP 660, negative COVID-19 test, AKI with creatinine 1.32, abnormal liver function (ALP 72, AST 101, ALT 83, total bilirubin 2.0), temperature normal, blood pressure 104/72, and atrial fibrillation with RVR.  He was started on Cardizem drip and admitted under hospitalist service.  Cardiology was consulted.  He was also in acute congestive heart failure.  Was a started on Lasix.  Patient underwent Myoview on 09/05/2019 which did not show any reversible ischemia.  He did have diffuse severe hypokinesia.  Assessment & Plan:   Principal Problem:   Atrial fibrillation with RVR (HCC) Active Problems:   Acute CHF (congestive heart failure) (HCC)   AKI (acute kidney injury) (HCC)   Abnormal LFTs  Acute pulmonary edema/acute systolic congestive heart failure: No history of heart failure in the past.  Echo shows 25 to 30% ejection fraction.  Left atrial size severely dilated.  Right atrial size moderately dilated.  This could very well be due to atrial fibrillation with RVR.  Echo pending.  Continue diuretics with Lasix IV 20 mg twice daily.  Strict I's and O's and sodium restricted diet with fluid restriction.  Appreciate cardiology help.  Management per them.  New onset atrial fibrillation with RVR: No history of fib in the past.  Was initially on Cardizem drip which was switched to amiodarone drip yesterday.  Still with heart rate around 130 but a symptomatic.  Was also started on digoxin today.  Appreciate cardiology help in  management per them.  History of remote hepatitis C/elevated LFTs: Hepatitis panel negative.  Will repeat LFTs tomorrow.  Obtain ultrasound abdomen today.  ?  AKI: Came in with creatinine 1.32.  Baseline unknown.  Some elevation in creatinine but good urine output.  Continue to monitor.  Avoid nephrotoxic agents.  DVT prophylaxis: Eliquis Code Status: Full code Family Communication:  None present at bedside.  Plan of care discussed with patient in length and he verbalized understanding and agreed with it. Disposition Plan: TBD  Estimated body mass index is 22.02 kg/m as calculated from the following:   Height as of this encounter: 6\' 2"  (1.88 m).   Weight as of this encounter: 77.8 kg.      Nutritional status:               Consultants:   Cardiology  Procedures:   None  Antimicrobials:   None   Subjective: Patient seen and examined after stress test.  His heart rate were around 130 in atrial fibrillation but he denied any chest pain, palpitation or shortness of breath.  In fact he stated that his breathing is better.  Objective: Vitals:   09/05/19 0652 09/05/19 1011 09/05/19 1014 09/05/19 1015  BP:  117/62 110/62 118/69  Pulse:      Resp:      Temp:      TempSrc:      SpO2:      Weight: 77.8 kg     Height:        Intake/Output  Summary (Last 24 hours) at 09/05/2019 1015 Last data filed at 09/05/2019 0612 Gross per 24 hour  Intake 1027.13 ml  Output 1775 ml  Net -747.87 ml   Filed Weights   09/03/19 2328 09/04/19 0541 09/05/19 0652  Weight: 77.8 kg 77.7 kg 77.8 kg    Examination:  General exam: Appears calm and comfortable  Respiratory system: Clear to auscultation. Respiratory effort normal. Cardiovascular system: S1 & S2 heard, irregularly irregular rate and rhythm, no JVD, murmurs, rubs, gallops or clicks. No pedal edema. Gastrointestinal system: Abdomen is nondistended, soft and nontender. No organomegaly or masses felt. Normal bowel  sounds heard. Central nervous system: Alert and oriented. No focal neurological deficits. Extremities: Symmetric 5 x 5 power. Skin: No rashes, lesions or ulcers.  Psychiatry: Judgement and insight appear normal. Mood & affect appropriate.   Data Reviewed: I have personally reviewed following labs and imaging studies  CBC: Recent Labs  Lab 09/03/19 1304 09/05/19 0306  WBC 7.7 7.1  NEUTROABS 5.3 5.0  HGB 12.1* 11.8*  HCT 38.8* 35.4*  MCV 89.0 86.6  PLT 195 950   Basic Metabolic Panel: Recent Labs  Lab 09/03/19 1304 09/04/19 0223 09/05/19 0306  NA 138  --  136  K 4.0  --  3.9  CL 106  --  102  CO2 22  --  25  GLUCOSE 117*  --  109*  BUN 42*  --  36*  CREATININE 1.32*  --  1.46*  CALCIUM 9.1  --  8.6*  MG  --  1.8 1.8   GFR: Estimated Creatinine Clearance: 54.8 mL/min (A) (by C-G formula based on SCr of 1.46 mg/dL (H)). Liver Function Tests: Recent Labs  Lab 09/03/19 1304 09/04/19 0051  AST 101* 93*  ALT 83* 81*  ALKPHOS 72 67  BILITOT 2.0* 1.6*  PROT 7.2 6.4*  ALBUMIN 3.7 3.2*   No results for input(s): LIPASE, AMYLASE in the last 168 hours. No results for input(s): AMMONIA in the last 168 hours. Coagulation Profile: Recent Labs  Lab 09/03/19 1304  INR 1.6*   Cardiac Enzymes: No results for input(s): CKTOTAL, CKMB, CKMBINDEX, TROPONINI in the last 168 hours. BNP (last 3 results) No results for input(s): PROBNP in the last 8760 hours. HbA1C: Recent Labs    09/04/19 0223  HGBA1C 5.5   CBG: No results for input(s): GLUCAP in the last 168 hours. Lipid Profile: Recent Labs    09/04/19 0223  CHOL 83  HDL 23*  LDLCALC 52  TRIG 41  CHOLHDL 3.6   Thyroid Function Tests: Recent Labs    09/04/19 0051  TSH 1.482   Anemia Panel: No results for input(s): VITAMINB12, FOLATE, FERRITIN, TIBC, IRON, RETICCTPCT in the last 72 hours. Sepsis Labs: No results for input(s): PROCALCITON, LATICACIDVEN in the last 168 hours.  Recent Results (from the past  240 hour(s))  SARS CORONAVIRUS 2 (TAT 6-24 HRS) Nasopharyngeal Nasopharyngeal Swab     Status: None   Collection Time: 09/03/19  1:04 PM   Specimen: Nasopharyngeal Swab  Result Value Ref Range Status   SARS Coronavirus 2 NEGATIVE NEGATIVE Final    Comment: (NOTE) SARS-CoV-2 target nucleic acids are NOT DETECTED. The SARS-CoV-2 RNA is generally detectable in upper and lower respiratory specimens during the acute phase of infection. Negative results do not preclude SARS-CoV-2 infection, do not rule out co-infections with other pathogens, and should not be used as the sole basis for treatment or other patient management decisions. Negative results must be combined with clinical  observations, patient history, and epidemiological information. The expected result is Negative. Fact Sheet for Patients: HairSlick.no Fact Sheet for Healthcare Providers: quierodirigir.com This test is not yet approved or cleared by the [place] FDA and  has been authorized for detection and/or diagnosis of SARS-CoV-2 by FDA under an Emergency Use Authorization (EUA). This EUA will remain  in effect (meaning this test can be used) for the duration of the COVID-19 declaration under Section 56 4(b)(1) of the Act, 21 U.S.C. section 360bbb-3(b)(1), unless the authorization is terminated or revoked sooner. Performed at [facility], [address], [place], [place] [address]       Radiology Studies: Dg Chest 2 View  Result Date: 09/05/2019 CLINICAL DATA:  Cough EXAM: CHEST - 2 VIEW COMPARISON:  09/03/2019 FINDINGS: Small bilateral pleural effusions. Cardiomegaly with vascular congestion and interstitial pulmonary edema, increased compared to prior. Patchy atelectasis at the bases IMPRESSION: Cardiomegaly with vascular congestion and increased interstitial pulmonary edema. Small pleural effusions Electronically Signed   By: Jasmine Pang M.D.   On:  09/05/2019 01:37   Dg Chest Port 1 View  Result Date: 09/03/2019 CLINICAL DATA:  Bilateral leg swelling for 4 days. EXAM: PORTABLE CHEST 1 VIEW COMPARISON:  None. FINDINGS: Cardiac silhouette is borderline enlarged. No mediastinal or hilar masses. Mild interstitial thickening noted most evident right peripheral lung base. Lungs otherwise clear. Suspect small right effusion. No pneumothorax. Skeletal structures are grossly intact. IMPRESSION: 1. Borderline cardiomegaly, mild interstitial thickening and probable small right effusion. Suspect mild congestive heart failure. Electronically Signed   By: Amie Portland M.D.   On: 09/03/2019 13:30    Scheduled Meds: . apixaban  5 mg Oral BID  . aspirin  81 mg Oral BID PC  . furosemide  20 mg Intravenous Q12H  . multivitamin with minerals  1 tablet Oral Daily  . regadenoson      . sodium chloride flush  3 mL Intravenous Q12H   Continuous Infusions: . sodium chloride    . amiodarone 30 mg/hr (09/05/19 0232)     LOS: 2 days   Time spent: 28 minutes   Hughie Closs, MD [facility]  09/05/2019, 10:15 AM   To contact the attending provider between 7A-7P or the covering provider during after hours 7P-7A, please log into the web site [email or web address] and use password [email or web address].

## 2019-09-05 NOTE — Progress Notes (Signed)
Tolerated well

## 2019-09-05 NOTE — Progress Notes (Signed)
Pt complaining of persistent cough since amiodarone drip was started today. I paged Cards on call with this information and will await orders.   45  Per Marletta Lor: obtained chest xray and advised her when read.

## 2019-09-05 NOTE — Care Management Important Message (Signed)
Important Message  Patient Details  Name: Michael Noble MRN: [id number] Date of Birth: 01/27/1951   Medicare Important Message Given:  Yes     Shelda Altes 09/05/2019, 12:14 PM

## 2019-09-06 ENCOUNTER — Inpatient Hospital Stay (HOSPITAL_COMMUNITY): Payer: Medicare Other

## 2019-09-06 DIAGNOSIS — R945 Abnormal results of liver function studies: Secondary | ICD-10-CM

## 2019-09-06 DIAGNOSIS — N289 Disorder of kidney and ureter, unspecified: Secondary | ICD-10-CM

## 2019-09-06 LAB — CBC WITH DIFFERENTIAL/PLATELET
Abs Immature Granulocytes: 0.02 10*3/uL (ref 0.00–0.07)
Basophils Absolute: 0 10*3/uL (ref 0.0–0.1)
Basophils Relative: 1 %
Eosinophils Absolute: 0.1 10*3/uL (ref 0.0–0.5)
Eosinophils Relative: 2 %
HCT: 36.7 % — ABNORMAL LOW (ref 39.0–52.0)
Hemoglobin: 11.6 g/dL — ABNORMAL LOW (ref 13.0–17.0)
Immature Granulocytes: 0 %
Lymphocytes Relative: 22 %
Lymphs Abs: 1.4 10*3/uL (ref 0.7–4.0)
MCH: 27.9 pg (ref 26.0–34.0)
MCHC: 31.6 g/dL (ref 30.0–36.0)
MCV: 88.2 fL (ref 80.0–100.0)
Monocytes Absolute: 0.7 10*3/uL (ref 0.1–1.0)
Monocytes Relative: 11 %
Neutro Abs: 4.2 10*3/uL (ref 1.7–7.7)
Neutrophils Relative %: 64 %
Platelets: 185 10*3/uL (ref 150–400)
RBC: 4.16 MIL/uL — ABNORMAL LOW (ref 4.22–5.81)
RDW: 14.3 % (ref 11.5–15.5)
WBC: 6.5 10*3/uL (ref 4.0–10.5)
nRBC: 0 % (ref 0.0–0.2)

## 2019-09-06 LAB — COMPREHENSIVE METABOLIC PANEL
ALT: 80 U/L — ABNORMAL HIGH (ref 0–44)
AST: 77 U/L — ABNORMAL HIGH (ref 15–41)
Albumin: 2.8 g/dL — ABNORMAL LOW (ref 3.5–5.0)
Alkaline Phosphatase: 61 U/L (ref 38–126)
Anion gap: 10 (ref 5–15)
BUN: 30 mg/dL — ABNORMAL HIGH (ref 8–23)
CO2: 25 mmol/L (ref 22–32)
Calcium: 8.6 mg/dL — ABNORMAL LOW (ref 8.9–10.3)
Chloride: 101 mmol/L (ref 98–111)
Creatinine, Ser: 1.48 mg/dL — ABNORMAL HIGH (ref 0.61–1.24)
GFR calc Af Amer: 56 mL/min — ABNORMAL LOW (ref >60)
GFR calc non Af Amer: 49 mL/min — ABNORMAL LOW (ref >60)
Glucose, Bld: 97 mg/dL (ref 70–99)
Potassium: 4.3 mmol/L (ref 3.5–5.1)
Sodium: 136 mmol/L (ref 135–145)
Total Bilirubin: 0.9 mg/dL (ref 0.3–1.2)
Total Protein: 5.5 g/dL — ABNORMAL LOW (ref 6.5–8.1)

## 2019-09-06 LAB — MAGNESIUM: Magnesium: 1.8 mg/dL (ref 1.7–2.4)

## 2019-09-06 MED ORDER — CARVEDILOL 6.25 MG PO TABS
6.2500 mg | ORAL_TABLET | Freq: Two times a day (BID) | ORAL | Status: DC
  Administered 2019-09-06 – 2019-09-07 (×2): 6.25 mg via ORAL
  Filled 2019-09-06 (×2): qty 1

## 2019-09-06 MED ORDER — FUROSEMIDE 40 MG PO TABS
40.0000 mg | ORAL_TABLET | Freq: Every day | ORAL | Status: DC
  Administered 2019-09-07: 40 mg via ORAL
  Filled 2019-09-06: qty 1

## 2019-09-06 NOTE — Progress Notes (Signed)
Progress Note  Patient Name: Michael Noble Date of Encounter: 09/06/2019  Primary Cardiologist: No primary care provider on file. New (Besnik Febus)  Subjective   Feels much better.  No longer has orthopnea.  Ankle edema has resolved. Believes that his usual ("dry weight") is 165 pounds.  Current weight 169. Net diuresis 3.1 L since admission. Creatinine stable. Remains in atrial fibrillation rapid ventricular response, but at night heart rate was down to the 80s-90s, currently 110s sitting in bed.  Inpatient Medications    Scheduled Meds:  apixaban  5 mg Oral BID   aspirin  81 mg Oral BID PC   carvedilol  6.25 mg Oral BID WC   digoxin  0.125 mg Oral Daily   furosemide  20 mg Intravenous Q12H   [START ON 09/07/2019] furosemide  40 mg Oral Daily   multivitamin with minerals  1 tablet Oral Daily   sodium chloride flush  3 mL Intravenous Q12H   Continuous Infusions:  sodium chloride     amiodarone 30 mg/hr (09/06/19 0539)   PRN Meds: sodium chloride, ondansetron (ZOFRAN) IV, sodium chloride flush   Vital Signs    Vitals:   09/05/19 1753 09/05/19 1800 09/05/19 2137 09/06/19 0534  BP: (!) 117/96 110/86 107/88 (!) 110/94  Pulse:   78 (!) 102  Resp:   17   Temp:   98.2 F (36.8 C) 98 F (36.7 C)  TempSrc:   Oral Oral  SpO2:   98% 98%  Weight:    76.8 kg  Height:        Intake/Output Summary (Last 24 hours) at 09/06/2019 0828 Last data filed at 09/06/2019 0500 Gross per 24 hour  Intake 1213.66 ml  Output 2425 ml  Net -1211.34 ml   Last 3 Weights 09/06/2019 09/05/2019 09/05/2019  Weight (lbs) 169 lb 5 oz 171 lb 8.3 oz 171 lb 8.3 oz  Weight (kg) 76.8 kg 77.8 kg 77.8 kg      Telemetry    Atrial fibrillation with rapid ventricular response- Personally Reviewed  ECG    No new tracing- Personally Reviewed  Physical Exam  Appears well.  Able to lie fully supine GEN: No acute distress.   Neck: No JVD Cardiac:  Irregular, tachycardic, no murmurs,  rubs, or gallops.  Respiratory: Clear to auscultation bilaterally. GI: Soft, nontender, non-distended  MS: No edema; No deformity. Neuro:  Nonfocal  Psych: Normal affect   Labs    High Sensitivity Troponin:   Recent Labs  Lab 09/04/19 0051 09/04/19 0223  TROPONINIHS 41* 39*      Chemistry Recent Labs  Lab 09/03/19 1304 09/04/19 0051 09/05/19 0306 09/06/19 0405  NA 138  --  136 136  K 4.0  --  3.9 4.3  CL 106  --  102 101  CO2 22  --  25 25  GLUCOSE 117*  --  109* 97  BUN 42*  --  36* 30*  CREATININE 1.32*  --  1.46* 1.48*  CALCIUM 9.1  --  8.6* 8.6*  PROT 7.2 6.4*  --  5.5*  ALBUMIN 3.7 3.2*  --  2.8*  AST 101* 93*  --  77*  ALT 83* 81*  --  80*  ALKPHOS 72 67  --  61  BILITOT 2.0* 1.6*  --  0.9  GFRNONAA 56*  --  49* 49*  GFRAA >60  --  57* 56*  ANIONGAP 10  --  9 10     Hematology Recent Labs  Lab 09/03/19  1304 09/05/19 0306 09/06/19 0405  WBC 7.7 7.1 6.5  RBC 4.36 4.09* 4.16*  HGB 12.1* 11.8* 11.6*  HCT 38.8* 35.4* 36.7*  MCV 89.0 86.6 88.2  MCH 27.8 28.9 27.9  MCHC 31.2 33.3 31.6  RDW 14.2 14.3 14.3  PLT 195 178 185    BNP Recent Labs  Lab 09/03/19 1304  BNP 667.1*     DDimer No results for input(s): DDIMER in the last 168 hours.   Radiology    Dg Chest 2 View  Result Date: 09/05/2019 CLINICAL DATA:  Cough EXAM: CHEST - 2 VIEW COMPARISON:  09/03/2019 FINDINGS: Small bilateral pleural effusions. Cardiomegaly with vascular congestion and interstitial pulmonary edema, increased compared to prior. Patchy atelectasis at the bases IMPRESSION: Cardiomegaly with vascular congestion and increased interstitial pulmonary edema. Small pleural effusions Electronically Signed   By: Jasmine Pang M.D.   On: 09/05/2019 01:37   Nm Myocar Multi W/spect W/wall Motion / Ef  Result Date: 09/05/2019 CLINICAL DATA:  Heart failure EXAM: MYOCARDIAL IMAGING WITH SPECT (REST AND PHARMACOLOGIC-STRESS) GATED LEFT VENTRICULAR WALL MOTION STUDY LEFT VENTRICULAR  EJECTION FRACTION TECHNIQUE: Standard myocardial SPECT imaging was performed after resting intravenous injection of 10 mCi Tc-78m tetrofosmin. Subsequently, intravenous infusion of Lexiscan was performed under the supervision of the Cardiology staff. At peak effect of the drug, 30 mCi Tc-56m tetrofosmin was injected intravenously and standard myocardial SPECT imaging was performed. Quantitative gated imaging was also performed to evaluate left ventricular wall motion, and estimate left ventricular ejection fraction. COMPARISON:  None. FINDINGS: Perfusion: No decreased activity in the left ventricle on stress imaging to suggest reversible ischemia or infarction. Wall Motion: Diffuse severe hypokinesia. Left Ventricular Ejection Fraction: 21 % End diastolic volume 182 ml End systolic volume 144 ml IMPRESSION: 1. No reversible ischemia or infarction. 2. Diffuse severe hypokinesia. 3. Left ventricular ejection fraction 21% 4. Non invasive risk stratification*: High *2012 Appropriate Use Criteria for Coronary Revascularization Focused Update: J Am Coll Cardiol. 2012;59(9):857-881. http://content.dementiazones.com.aspx?articleid=[email or web address] Electronically Signed   By: Charlett Nose M.D.   On: 09/05/2019 13:19   US Abdomen Limited  Result Date: 09/06/2019 CLINICAL DATA:  Abnormal liver function tests.  Hepatitis. EXAM: ULTRASOUND ABDOMEN LIMITED RIGHT UPPER QUADRANT COMPARISON:  None. FINDINGS: Gallbladder: The patient does appear to have a small amount of gallbladder sludge. No evidence of shadowing stones. No wall thickening or surrounding fluid. Negative Murphy sign. Common bile duct: Diameter: Dilated at 9.5 mm.  No ductal stone identified. Liver: No focal lesion identified. Within normal limits in parenchymal echogenicity. Portal vein is patent on color Doppler imaging with normal direction of blood flow towards the liver. Other: None. IMPRESSION: Liver parenchyma itself appears normal. There is sludge in the  gallbladder. The common duct is prominent measuring up to 9.5 mm. No ductal stone is visualized. The patient does not appear to have intrahepatic ductal dilatation. Therefore, the significance of this finding is uncertain. Electronically Signed   By: Paulina Fusi M.D.   On: 09/06/2019 08:15    Cardiac Studies   Echocardiogram 09/04/2019  1. Left ventricular ejection fraction, by visual estimation, is 25 to 30%. The left ventricle has severely decreased function. Mildly increased left ventricular size. There is no left ventricular hypertrophy.  2. Left ventricular diastolic Doppler parameters are indeterminate pattern of LV diastolic filling.  3. Global right ventricle has normal systolic function.The right ventricular size is normal. No increase in right ventricular wall thickness.  4. Left atrial size was severely dilated.  5. Right atrial size  was moderately dilated.  6. The mitral valve is normal in structure. Mild mitral valve regurgitation. No evidence of mitral stenosis.  7. The tricuspid valve is normal in structure. Tricuspid valve regurgitation moderate.  8. The aortic valve is normal in structure. Aortic valve regurgitation is mild to moderate by color flow Doppler. Structurally normal aortic valve, with no evidence of sclerosis or stenosis.  9. The pulmonic valve was normal in structure. Pulmonic valve regurgitation is not visualized by color flow Doppler. 10. Mildly elevated pulmonary artery systolic pressure. 11. The inferior vena cava is dilated in size with <50% respiratory variability, suggesting right atrial pressure of 15 mmHg.  Lexiscan Myoview 09/05/2019 FINDINGS: Perfusion: No decreased activity in the left ventricle on stress imaging to suggest reversible ischemia or infarction.  Wall Motion: Diffuse severe hypokinesia.  Left Ventricular Ejection Fraction: 21 %  End diastolic volume 762 ml  End systolic volume 831 ml  IMPRESSION: 1. No reversible ischemia or  infarction.  2. Diffuse severe hypokinesia.  3. Left ventricular ejection fraction 21%  4. Non invasive risk stratification*: High Patient Profile     68 y.o. male with newly diagnosed atrial fibrillation rapid ventricular response and acute systolic heart failure, moderate renal insufficiency and abnormal transaminases consistent.  Assessment & Plan    1. CHF: Approaching euvolemia.  We will use his report of a usual weight of 165 pounds as our initial "dry weight" target.  Switch to oral diuretics tomorrow morning.  Blood pressure is low and does not allow initiation of RAAS inhibitors yet.  Heart rate control comes at a premium, so we will primarily titrate his carvedilol. 2. AFib: Severely dilated left atrium suggests chronicity and also poor odds of return to sinus rhythm on long-term maintenance of sinus rhythm.  We will try to focus on rate control, although it is worth considering one shot at cardioversion after at least 3 weeks of anticoagulation.  Due to low blood pressure we have had to use amiodarone and digoxin.  Hopefully, in the long-term will be able to use primarily beta-blockers for rate control.  Normal TSH. 3.  Anticoagulation: He is currently on Eliquis but does not have insurance drug coverage.  Will need assistance.  Has a 30-day free card.  Stop aspirin. 4.  Abnormal transaminases: Slowly improving, likely due to hepatic congestion.  Watch closely while receiving amiodarone.  Possibly ready for discharge tomorrow from a cardiology point of view.     For questions or updates, please contact [place] Please consult [email or web address] for contact info under        Signed, Sanda Klein, MD  09/06/2019, 8:28 AM

## 2019-09-06 NOTE — Progress Notes (Signed)
PROGRESS NOTE    Michael ShoutsLawrence Mclelland  ZOX:[id number] DOB: [birth year] DOA: 09/03/2019 PCP: Patient, No Pcp Per   Brief Narrative:  Michael Noble is a 68 y.o. male with medical history significant of remote hepatitis, arthritis, who presented to ED on 09/03/2019 with bilateral leg edema and shortness of breath of 4 days duration.  This is associated with mild dry cough but no chest pain, fever or chills.  No sick contact.  No other complaint.  Upon arrival to ED, pt was found to have BNP 660, negative COVID-19 test, AKI with creatinine 1.32, abnormal liver function (ALP 72, AST 101, ALT 83, total bilirubin 2.0), temperature normal, blood pressure 104/72, and atrial fibrillation with RVR.  He was started on Cardizem drip and admitted under hospitalist service.  Cardiology was consulted.  He was also in acute congestive heart failure.  Was a started on Lasix.  Patient underwent Myoview on 09/05/2019 which did not show any reversible ischemia.  He did have diffuse severe hypokinesia.  Assessment & Plan:   Principal Problem:   Atrial fibrillation with RVR (HCC) Active Problems:   Acute CHF (congestive heart failure) (HCC)   AKI (acute kidney injury) (HCC)   Abnormal LFTs  Acute pulmonary edema/acute systolic congestive heart failure: No history of heart failure in the past.  Echo shows 25 to 30% ejection fraction.  Left atrial size severely dilated.  Right atrial size moderately dilated.  This could very well be due to atrial fibrillation with RVR.  Continue  Lasix IV 20 mg twice daily.  Strict I's and O's and sodium restricted diet with fluid restriction.  He is net negative around 4 L since admission.  Appreciate cardiology help.  Management per them.  New onset atrial fibrillation with RVR: No history of fib in the past.  Was initially on Cardizem drip which was switched to amiodarone drip.  Still with rates around 110.  Continues to be on Cardizem drip.  Digoxin added by cardiology.  Appreciate  cardiology help.  Management per them.  History of remote hepatitis C/elevated LFTs: Hepatitis panel negative.  LFTs with some improvement.  Ultrasound negative for any acute pathology.    ?  AKI: Came in with creatinine 1.32.  Baseline unknown.  Some elevation in creatinine but good urine output.  Continue to monitor.  Avoid nephrotoxic agents.  DVT prophylaxis: Eliquis Code Status: Full code Family Communication:  None present at bedside.  Plan of care discussed with patient in length and he verbalized understanding and agreed with it. Disposition Plan: TBD .  Most likely discharge tomorrow.  Estimated body mass index is 21.74 kg/m as calculated from the following:   Height as of this encounter: 6\' 2"  (1.88 m).   Weight as of this encounter: 76.8 kg.      Nutritional status:               Consultants:   Cardiology  Procedures:   None  Antimicrobials:   None   Subjective: Patient seen and examined.  No complaints.  No chest pain or shortness of breath.  Objective: Vitals:   09/05/19 1753 09/05/19 1800 09/05/19 2137 09/06/19 0534  BP: (!) 117/96 110/86 107/88 (!) 110/94  Pulse:   78 (!) 102  Resp:   17   Temp:   98.2 F (36.8 C) 98 F (36.7 C)  TempSrc:   Oral Oral  SpO2:   98% 98%  Weight:    76.8 kg  Height:  Intake/Output Summary (Last 24 hours) at 09/06/2019 0847 Last data filed at 09/06/2019 0500 Gross per 24 hour  Intake 1213.66 ml  Output 2425 ml  Net -1211.34 ml   Filed Weights   09/05/19 0652 09/05/19 0800 09/06/19 0534  Weight: 77.8 kg 77.8 kg 76.8 kg    Examination:  General exam: Appears calm and comfortable  Respiratory system: Clear to auscultation. Respiratory effort normal. Cardiovascular system: S1 & S2 heard, irregularly irregular rate and rhythm. No JVD, murmurs, rubs, gallops or clicks. No pedal edema. Gastrointestinal system: Abdomen is nondistended, soft and nontender. No organomegaly or masses felt. Normal  bowel sounds heard. Central nervous system: Alert and oriented. No focal neurological deficits. Extremities: Symmetric 5 x 5 power. Skin: No rashes, lesions or ulcers.  Psychiatry: Judgement and insight appear normal. Mood & affect appropriate.   Data Reviewed: I have personally reviewed following labs and imaging studies  CBC: Recent Labs  Lab 09/03/19 1304 09/05/19 0306 09/06/19 0405  WBC 7.7 7.1 6.5  NEUTROABS 5.3 5.0 4.2  HGB 12.1* 11.8* 11.6*  HCT 38.8* 35.4* 36.7*  MCV 89.0 86.6 88.2  PLT 195 178 185   Basic Metabolic Panel: Recent Labs  Lab 09/03/19 1304 09/04/19 0223 09/05/19 0306 09/06/19 0405  NA 138  --  136 136  K 4.0  --  3.9 4.3  CL 106  --  102 101  CO2 22  --  25 25  GLUCOSE 117*  --  109* 97  BUN 42*  --  36* 30*  CREATININE 1.32*  --  1.46* 1.48*  CALCIUM 9.1  --  8.6* 8.6*  MG  --  1.8 1.8 1.8   GFR: Estimated Creatinine Clearance: 53.3 mL/min (A) (by C-G formula based on SCr of 1.48 mg/dL (H)). Liver Function Tests: Recent Labs  Lab 09/03/19 1304 09/04/19 0051 09/06/19 0405  AST 101* 93* 77*  ALT 83* 81* 80*  ALKPHOS 72 67 61  BILITOT 2.0* 1.6* 0.9  PROT 7.2 6.4* 5.5*  ALBUMIN 3.7 3.2* 2.8*   No results for input(s): LIPASE, AMYLASE in the last 168 hours. No results for input(s): AMMONIA in the last 168 hours. Coagulation Profile: Recent Labs  Lab 09/03/19 1304  INR 1.6*   Cardiac Enzymes: No results for input(s): CKTOTAL, CKMB, CKMBINDEX, TROPONINI in the last 168 hours. BNP (last 3 results) No results for input(s): PROBNP in the last 8760 hours. HbA1C: Recent Labs    09/04/19 0223  HGBA1C 5.5   CBG: No results for input(s): GLUCAP in the last 168 hours. Lipid Profile: Recent Labs    09/04/19 0223  CHOL 83  HDL 23*  LDLCALC 52  TRIG 41  CHOLHDL 3.6   Thyroid Function Tests: Recent Labs    09/04/19 0051  TSH 1.482   Anemia Panel: No results for input(s): VITAMINB12, FOLATE, FERRITIN, TIBC, IRON, RETICCTPCT  in the last 72 hours. Sepsis Labs: No results for input(s): PROCALCITON, LATICACIDVEN in the last 168 hours.  Recent Results (from the past 240 hour(s))  SARS CORONAVIRUS 2 (TAT 6-24 HRS) Nasopharyngeal Nasopharyngeal Swab     Status: None   Collection Time: 09/03/19  1:04 PM   Specimen: Nasopharyngeal Swab  Result Value Ref Range Status   SARS Coronavirus 2 NEGATIVE NEGATIVE Final    Comment: (NOTE) SARS-CoV-2 target nucleic acids are NOT DETECTED. The SARS-CoV-2 RNA is generally detectable in upper and lower respiratory specimens during the acute phase of infection. Negative results do not preclude SARS-CoV-2 infection, do not rule  out co-infections with other pathogens, and should not be used as the sole basis for treatment or other patient management decisions. Negative results must be combined with clinical observations, patient history, and epidemiological information. The expected result is Negative. Fact Sheet for Patients: HairSlick.no Fact Sheet for Healthcare Providers: quierodirigir.com This test is not yet approved or cleared by the [place] FDA and  has been authorized for detection and/or diagnosis of SARS-CoV-2 by FDA under an Emergency Use Authorization (EUA). This EUA will remain  in effect (meaning this test can be used) for the duration of the COVID-19 declaration under Section 56 4(b)(1) of the Act, 21 U.S.C. section 360bbb-3(b)(1), unless the authorization is terminated or revoked sooner. Performed at [facility], [address], [place], [place] [address]       Radiology Studies: Dg Chest 2 View  Result Date: 09/05/2019 CLINICAL DATA:  Cough EXAM: CHEST - 2 VIEW COMPARISON:  09/03/2019 FINDINGS: Small bilateral pleural effusions. Cardiomegaly with vascular congestion and interstitial pulmonary edema, increased compared to prior. Patchy atelectasis at the bases IMPRESSION: Cardiomegaly  with vascular congestion and increased interstitial pulmonary edema. Small pleural effusions Electronically Signed   By: Jasmine Pang M.D.   On: 09/05/2019 01:37   Nm Myocar Multi W/spect W/wall Motion / Ef  Result Date: 09/05/2019 CLINICAL DATA:  Heart failure EXAM: MYOCARDIAL IMAGING WITH SPECT (REST AND PHARMACOLOGIC-STRESS) GATED LEFT VENTRICULAR WALL MOTION STUDY LEFT VENTRICULAR EJECTION FRACTION TECHNIQUE: Standard myocardial SPECT imaging was performed after resting intravenous injection of 10 mCi Tc-8m tetrofosmin. Subsequently, intravenous infusion of Lexiscan was performed under the supervision of the Cardiology staff. At peak effect of the drug, 30 mCi Tc-42m tetrofosmin was injected intravenously and standard myocardial SPECT imaging was performed. Quantitative gated imaging was also performed to evaluate left ventricular wall motion, and estimate left ventricular ejection fraction. COMPARISON:  None. FINDINGS: Perfusion: No decreased activity in the left ventricle on stress imaging to suggest reversible ischemia or infarction. Wall Motion: Diffuse severe hypokinesia. Left Ventricular Ejection Fraction: 21 % End diastolic volume 182 ml End systolic volume 144 ml IMPRESSION: 1. No reversible ischemia or infarction. 2. Diffuse severe hypokinesia. 3. Left ventricular ejection fraction 21% 4. Non invasive risk stratification*: High *2012 Appropriate Use Criteria for Coronary Revascularization Focused Update: J Am Coll Cardiol. 2012;59(9):857-881. http://content.dementiazones.com.aspx?articleid=1201161 Electronically Signed   By: Charlett Nose M.D.   On: 09/05/2019 13:19   US Abdomen Limited  Result Date: 09/06/2019 CLINICAL DATA:  Abnormal liver function tests.  Hepatitis. EXAM: ULTRASOUND ABDOMEN LIMITED RIGHT UPPER QUADRANT COMPARISON:  None. FINDINGS: Gallbladder: The patient does appear to have a small amount of gallbladder sludge. No evidence of shadowing stones. No wall thickening or  surrounding fluid. Negative Murphy sign. Common bile duct: Diameter: Dilated at 9.5 mm.  No ductal stone identified. Liver: No focal lesion identified. Within normal limits in parenchymal echogenicity. Portal vein is patent on color Doppler imaging with normal direction of blood flow towards the liver. Other: None. IMPRESSION: Liver parenchyma itself appears normal. There is sludge in the gallbladder. The common duct is prominent measuring up to 9.5 mm. No ductal stone is visualized. The patient does not appear to have intrahepatic ductal dilatation. Therefore, the significance of this finding is uncertain. Electronically Signed   By: Paulina Fusi M.D.   On: 09/06/2019 08:15    Scheduled Meds: . apixaban  5 mg Oral BID  . carvedilol  6.25 mg Oral BID WC  . digoxin  0.125 mg Oral Daily  .  furosemide  20 mg Intravenous Q12H  . [START ON 09/07/2019] furosemide  40 mg Oral Daily  . multivitamin with minerals  1 tablet Oral Daily  . sodium chloride flush  3 mL Intravenous Q12H   Continuous Infusions: . sodium chloride    . amiodarone 30 mg/hr (09/06/19 0539)     LOS: 3 days   Time spent: 27 minutes   Darliss Cheney, MD [facility]  09/06/2019, 8:47 AM   To contact the attending provider between 7A-7P or the covering provider during after hours 7P-7A, please log into the web site [email or web address] and use password [email or web address].

## 2019-09-06 NOTE — Progress Notes (Signed)
   Message has been sent to [facility] at [facility] to arrange for hospital follow-up for new heart failure and new A. Fib. The patient will be called to arrange appt.   Daune Perch, AGNP-C [facility] 09/06/2019  9:09 AM

## 2019-09-07 LAB — BASIC METABOLIC PANEL
Anion gap: 11 (ref 5–15)
BUN: 29 mg/dL — ABNORMAL HIGH (ref 8–23)
CO2: 26 mmol/L (ref 22–32)
Calcium: 8.8 mg/dL — ABNORMAL LOW (ref 8.9–10.3)
Chloride: 100 mmol/L (ref 98–111)
Creatinine, Ser: 1.4 mg/dL — ABNORMAL HIGH (ref 0.61–1.24)
GFR calc Af Amer: >60 mL/min (ref >60)
GFR calc non Af Amer: 52 mL/min — ABNORMAL LOW (ref >60)
Glucose, Bld: 54 mg/dL — ABNORMAL LOW (ref 70–99)
Potassium: 3.9 mmol/L (ref 3.5–5.1)
Sodium: 137 mmol/L (ref 135–145)

## 2019-09-07 LAB — CBC WITH DIFFERENTIAL/PLATELET
Abs Immature Granulocytes: 0.03 10*3/uL (ref 0.00–0.07)
Basophils Absolute: 0 10*3/uL (ref 0.0–0.1)
Basophils Relative: 0 %
Eosinophils Absolute: 0.2 10*3/uL (ref 0.0–0.5)
Eosinophils Relative: 3 %
HCT: 37.4 % — ABNORMAL LOW (ref 39.0–52.0)
Hemoglobin: 12.2 g/dL — ABNORMAL LOW (ref 13.0–17.0)
Immature Granulocytes: 1 %
Lymphocytes Relative: 21 %
Lymphs Abs: 1.3 10*3/uL (ref 0.7–4.0)
MCH: 28.6 pg (ref 26.0–34.0)
MCHC: 32.6 g/dL (ref 30.0–36.0)
MCV: 87.8 fL (ref 80.0–100.0)
Monocytes Absolute: 0.7 10*3/uL (ref 0.1–1.0)
Monocytes Relative: 10 %
Neutro Abs: 4.2 10*3/uL (ref 1.7–7.7)
Neutrophils Relative %: 65 %
Platelets: 197 10*3/uL (ref 150–400)
RBC: 4.26 MIL/uL (ref 4.22–5.81)
RDW: 14.1 % (ref 11.5–15.5)
WBC: 6.4 10*3/uL (ref 4.0–10.5)
nRBC: 0 % (ref 0.0–0.2)

## 2019-09-07 LAB — MAGNESIUM: Magnesium: 1.9 mg/dL (ref 1.7–2.4)

## 2019-09-07 MED ORDER — CARVEDILOL 6.25 MG PO TABS: 6.2500 mg | ORAL_TABLET | Freq: Two times a day (BID) | ORAL | 0 refills | Status: DC

## 2019-09-07 MED ORDER — AMIODARONE HCL 200 MG PO TABS: ORAL_TABLET | ORAL | 0 refills | Status: DC

## 2019-09-07 MED ORDER — DIGOXIN 125 MCG PO TABS: 0.1250 mg | ORAL_TABLET | Freq: Every day | ORAL | 0 refills | Status: DC

## 2019-09-07 MED ORDER — FUROSEMIDE 40 MG PO TABS: 40.0000 mg | ORAL_TABLET | Freq: Every day | ORAL | 0 refills | Status: DC

## 2019-09-07 MED ORDER — AMIODARONE HCL 200 MG PO TABS
200.0000 mg | ORAL_TABLET | Freq: Two times a day (BID) | ORAL | Status: DC
  Administered 2019-09-07: 200 mg via ORAL
  Filled 2019-09-07: qty 1

## 2019-09-07 MED ORDER — APIXABAN 5 MG PO TABS: 5.0000 mg | ORAL_TABLET | Freq: Two times a day (BID) | ORAL | 0 refills | Status: DC

## 2019-09-07 MED ORDER — AMIODARONE HCL 200 MG PO TABS: 200.0000 mg | ORAL_TABLET | Freq: Every day | ORAL | Status: DC

## 2019-09-07 MED ORDER — INFLUENZA VAC A&B SA ADJ QUAD 0.5 ML IM PRSY: 0.5000 mL | PREFILLED_SYRINGE | INTRAMUSCULAR | Status: DC

## 2019-09-07 MED ORDER — POTASSIUM CHLORIDE ER 10 MEQ PO TBCR: 10.0000 meq | EXTENDED_RELEASE_TABLET | Freq: Two times a day (BID) | ORAL | 0 refills | Status: DC

## 2019-09-07 MED ORDER — INFLUENZA VAC A&B SA ADJ QUAD 0.5 ML IM PRSY
0.5000 mL | PREFILLED_SYRINGE | INTRAMUSCULAR | Status: AC | PRN
  Administered 2019-09-07: 15:00:00 0.5 mL via INTRAMUSCULAR
  Filled 2019-09-07: qty 0.5

## 2019-09-07 NOTE — Progress Notes (Signed)
Progress Note  Patient Name: Michael Noble Date of Encounter: 09/07/2019  Primary Cardiologist: New (Floria Brandau)  Subjective   Feels better, no orthopnea, walked without dyspnea. 167 lb and still dropping, close to his estimated "dry weight" of 165 lb. Net diuresis 700 mL last 24h.  Inpatient Medications    Scheduled Meds:  apixaban  5 mg Oral BID   carvedilol  6.25 mg Oral BID WC   digoxin  0.125 mg Oral Daily   furosemide  40 mg Oral Daily   multivitamin with minerals  1 tablet Oral Daily   sodium chloride flush  3 mL Intravenous Q12H   Continuous Infusions:  sodium chloride     amiodarone 30 mg/hr (09/07/19 0352)   PRN Meds: sodium chloride, ondansetron (ZOFRAN) IV, sodium chloride flush   Vital Signs    Vitals:   09/06/19 2222 09/07/19 0039 09/07/19 0605 09/07/19 0609  BP: 114/67 (!) 83/67 108/78   Pulse: (!) 56  100   Resp: 17     Temp: (!) 97.4 F (36.3 C)  (!) 97.5 F (36.4 C)   TempSrc: Oral  Oral   SpO2: 96%  100%   Weight:    75.7 kg  Height:        Intake/Output Summary (Last 24 hours) at 09/07/2019 0915 Last data filed at 09/07/2019 0600 Gross per 24 hour  Intake 1287.36 ml  Output 2175 ml  Net -887.64 ml   Last 3 Weights 09/07/2019 09/06/2019 09/05/2019  Weight (lbs) 166 lb 12.8 oz 169 lb 5 oz 171 lb 8.3 oz  Weight (kg) 75.66 kg 76.8 kg 77.8 kg      Telemetry    AF, rates 80s at rest, 100-110s with light activity - Personally Reviewed  ECG    No new tracing - Personally Reviewed  Physical Exam  Appears well GEN: No acute distress.   Neck: No JVD Cardiac: irregular, no murmurs, rubs, or gallops.  Respiratory: Clear to auscultation bilaterally. GI: Soft, nontender, non-distended  MS: No edema; No deformity. Neuro:  Nonfocal  Psych: Normal affect   Labs    High Sensitivity Troponin:   Recent Labs  Lab 09/04/19 0051 09/04/19 0223  TROPONINIHS 41* 39*      Chemistry Recent Labs  Lab 09/03/19 1304  09/04/19 0051 09/05/19 0306 09/06/19 0405 09/07/19 0806  NA 138  --  136 136 137  K 4.0  --  3.9 4.3 3.9  CL 106  --  102 101 100  CO2 22  --  25 25 26   GLUCOSE 117*  --  109* 97 54*  BUN 42*  --  36* 30* 29*  CREATININE 1.32*  --  1.46* 1.48* 1.40*  CALCIUM 9.1  --  8.6* 8.6* 8.8*  PROT 7.2 6.4*  --  5.5*  --   ALBUMIN 3.7 3.2*  --  2.8*  --   AST 101* 93*  --  77*  --   ALT 83* 81*  --  80*  --   ALKPHOS 72 67  --  61  --   BILITOT 2.0* 1.6*  --  0.9  --   GFRNONAA 56*  --  49* 49* 52*  GFRAA >60  --  57* 56* >60  ANIONGAP 10  --  9 10 11      Hematology Recent Labs  Lab 09/05/19 0306 09/06/19 0405 09/07/19 0449  WBC 7.1 6.5 6.4  RBC 4.09* 4.16* 4.26  HGB 11.8* 11.6* 12.2*  HCT 35.4* 36.7* 37.4*  MCV 86.6 88.2 87.8  MCH 28.9 27.9 28.6  MCHC 33.3 31.6 32.6  RDW 14.3 14.3 14.1  PLT 178 185 197    BNP Recent Labs  Lab 09/03/19 1304  BNP 667.1*     DDimer No results for input(s): DDIMER in the last 168 hours.   Radiology    Nm Myocar Multi W/spect W/wall Motion / Ef  Result Date: 09/05/2019 CLINICAL DATA:  Heart failure EXAM: MYOCARDIAL IMAGING WITH SPECT (REST AND PHARMACOLOGIC-STRESS) GATED LEFT VENTRICULAR WALL MOTION STUDY LEFT VENTRICULAR EJECTION FRACTION TECHNIQUE: Standard myocardial SPECT imaging was performed after resting intravenous injection of 10 mCi Tc-19m tetrofosmin. Subsequently, intravenous infusion of Lexiscan was performed under the supervision of the Cardiology staff. At peak effect of the drug, 30 mCi Tc-18m tetrofosmin was injected intravenously and standard myocardial SPECT imaging was performed. Quantitative gated imaging was also performed to evaluate left ventricular wall motion, and estimate left ventricular ejection fraction. COMPARISON:  None. FINDINGS: Perfusion: No decreased activity in the left ventricle on stress imaging to suggest reversible ischemia or infarction. Wall Motion: Diffuse severe hypokinesia. Left Ventricular  Ejection Fraction: 21 % End diastolic volume 182 ml End systolic volume 144 ml IMPRESSION: 1. No reversible ischemia or infarction. 2. Diffuse severe hypokinesia. 3. Left ventricular ejection fraction 21% 4. Non invasive risk stratification*: High *2012 Appropriate Use Criteria for Coronary Revascularization Focused Update: J Am Coll Cardiol. 2012;59(9):857-881. http://content.dementiazones.com.aspx?articleid=[email or web address] Electronically Signed   By: Charlett Nose M.D.   On: 09/05/2019 13:19   US Abdomen Limited  Result Date: 09/06/2019 CLINICAL DATA:  Abnormal liver function tests.  Hepatitis. EXAM: ULTRASOUND ABDOMEN LIMITED RIGHT UPPER QUADRANT COMPARISON:  None. FINDINGS: Gallbladder: The patient does appear to have a small amount of gallbladder sludge. No evidence of shadowing stones. No wall thickening or surrounding fluid. Negative Murphy sign. Common bile duct: Diameter: Dilated at 9.5 mm.  No ductal stone identified. Liver: No focal lesion identified. Within normal limits in parenchymal echogenicity. Portal vein is patent on color Doppler imaging with normal direction of blood flow towards the liver. Other: None. IMPRESSION: Liver parenchyma itself appears normal. There is sludge in the gallbladder. The common duct is prominent measuring up to 9.5 mm. No ductal stone is visualized. The patient does not appear to have intrahepatic ductal dilatation. Therefore, the significance of this finding is uncertain. Electronically Signed   By: Paulina Fusi M.D.   On: 09/06/2019 08:15    Cardiac Studies   Echocardiogram 09/04/2019 1. Left ventricular ejection fraction, by visual estimation, is 25 to 30%. The left ventricle has severely decreased function. Mildly increased left ventricular size. There is no left ventricular hypertrophy. 2. Left ventricular diastolic Doppler parameters are indeterminate pattern of LV diastolic filling. 3. Global right ventricle has normal systolic function.The right  ventricular size is normal. No increase in right ventricular wall thickness. 4. Left atrial size was severely dilated. 5. Right atrial size was moderately dilated. 6. The mitral valve is normal in structure. Mild mitral valve regurgitation. No evidence of mitral stenosis. 7. The tricuspid valve is normal in structure. Tricuspid valve regurgitation moderate. 8. The aortic valve is normal in structure. Aortic valve regurgitation is mild to moderate by color flow Doppler. Structurally normal aortic valve, with no evidence of sclerosis or stenosis. 9. The pulmonic valve was normal in structure. Pulmonic valve regurgitation is not visualized by color flow Doppler. 10. Mildly elevated pulmonary artery systolic pressure. 11. The inferior vena cava is dilated in size with <50% respiratory variability, suggesting right  atrial pressure of 15 mmHg.  Lexiscan Myoview 09/05/2019 FINDINGS: Perfusion: No decreased activity in the left ventricle on stress imaging to suggest reversible ischemia or infarction. Wall Motion: Diffuse severe hypokinesia. Left Ventricular Ejection Fraction: 21 %  IMPRESSION: 1. No reversible ischemia or infarction. 2. Diffuse severe hypokinesia. 3. Left ventricular ejection fraction 21% 4. Non invasive risk stratification*: High  Patient Profile     68 y.o. male with newly diagnosed atrial fibrillation rapid ventricular response and acute systolic heart failure, moderate renal insufficiency and abnormal transaminases  Assessment & Plan    1. CHF: Approaching euvolemia,  165 pounds target "dry weight" target.  Switched to oral diuretics.  Start RAAS inhibitors as an outpatient.  Heart rate control comes at a premium, so we will primarily titrate his carvedilol. 2. AFib: Severely dilated left atrium suggests chronicity and also poor odds of return to sinus rhythm on long-term maintenance of sinus rhythm.  We will try to focus on rate control, although it is worth  considering one shot at cardioversion after at least 3 weeks of anticoagulation.  Due to low blood pressure we have had to use amiodarone and digoxin.  Hopefully, in the long-term will be able to use primarily beta-blockers for rate control.  Normal TSH. 3.  Anticoagulation: He is currently on Eliquis but does not have insurance drug coverage.  Will need assistance.  Has a 30-day free card.  Stop aspirin. 4.  Abnormal transaminases: Slowly improving, likely due to hepatic congestion.  Watch closely while receiving amiodarone.  [facility] will sign off.   Medication Recommendations:   Eliquis 5 mg BID Amiodarone 200 mg BID for 1 week, then 200 mg daily Carvedilol 6.25 mg BID Furosemide 40 mg daily Potassium chloride 20 mEq daily Digoxin 0.125 mg daily Other recommendations (labs, testing, etc):   2000 mg sodium dietary restriction daily weight log call MD for wt gain >3lb/24h, >5lb/wk, any gain >170 lb. Follow up as an outpatient:   Will arrange TOC vist and CMET in 2 weeks. LFTs and TSH q 6 months while on amiodarone Plan to increase carvedilol to 12.5 mg BID at f/u appt Add ARB once rate control is excellent if BP allows. Stop dig first once rate control is good Discuss DCCV at follow up to be performed no sooner than 3 weeks of anticoagulation.   For questions or updates, please contact [place] Please consult [email or web address] for contact info under        Signed, Sanda Klein, MD  09/07/2019, 9:15 AM

## 2019-09-07 NOTE — Progress Notes (Signed)
PROGRESS NOTE    Kharon Hixon  YQI:[id number] DOB: November 25, 1950 DOA: 09/03/2019 PCP: Patient, No Pcp Per   Brief Narrative:  Alfio Loescher is a 68 y.o. male with medical history significant of remote hepatitis, arthritis, who presented to ED on 09/03/2019 with bilateral leg edema and shortness of breath of 4 days duration.  This is associated with mild dry cough but no chest pain, fever or chills.  No sick contact.  No other complaint.  Upon arrival to ED, pt was found to have BNP 660, negative COVID-19 test, AKI with creatinine 1.32, abnormal liver function (ALP 72, AST 101, ALT 83, total bilirubin 2.0), temperature normal, blood pressure 104/72, and atrial fibrillation with RVR.  He was started on Cardizem drip and admitted under hospitalist service.  Cardiology was consulted.  He was also in acute congestive heart failure.  Was a started on Lasix.  Patient underwent Myoview on 09/05/2019 which did not show any reversible ischemia.  He did have diffuse severe hypokinesia.  Assessment & Plan:   Principal Problem:   Atrial fibrillation with RVR (HCC) Active Problems:   Acute CHF (congestive heart failure) (HCC)   AKI (acute kidney injury) (HCC)   Abnormal LFTs  Acute pulmonary edema/acute systolic congestive heart failure: No history of heart failure in the past.  Echo shows 25 to 30% ejection fraction.  Left atrial size severely dilated.  Right atrial size moderately dilated.  This could very well be due to atrial fibrillation with RVR.  Completed Lasix.  Strict I's and O's and sodium restricted diet with fluid restriction.  He is net negative around 4 L since admission.  Appreciate cardiology help.  Management per them.  New onset atrial fibrillation with RVR: No history of fib in the past.  Was initially on Cardizem drip which was switched to amiodarone drip.  His rates are still around 110 and 120.  Continues to be on amiodarone drip.  Further management per cardiology.  Appreciate their  help.   History of remote hepatitis C/elevated LFTs: Hepatitis panel negative.  LFTs with some improvement.  Ultrasound negative for any acute pathology.    ?  AKI: Came in with creatinine 1.32.  Baseline unknown.  Some elevation in creatinine but good urine output.  Continue to monitor.  Avoid nephrotoxic agents.  DVT prophylaxis: Eliquis Code Status: Full code Family Communication:  None present at bedside.  Plan of care discussed with patient in length and he verbalized understanding and agreed with it. Disposition Plan: Most likely discharge tomorrow.  Estimated body mass index is 21.42 kg/m as calculated from the following:   Height as of this encounter: 6\' 2"  (1.88 m).   Weight as of this encounter: 75.7 kg.      Nutritional status:               Consultants:   Cardiology  Procedures:   None  Antimicrobials:   None   Subjective: Patient seen and examined.  Feels better.  No complaints.  Objective: Vitals:   09/06/19 2222 09/07/19 0039 09/07/19 0605 09/07/19 0609  BP: 114/67 (!) 83/67 108/78   Pulse: (!) 56  100   Resp: 17     Temp: (!) 97.4 F (36.3 C)  (!) 97.5 F (36.4 C)   TempSrc: Oral  Oral   SpO2: 96%  100%   Weight:    75.7 kg  Height:        Intake/Output Summary (Last 24 hours) at 09/07/2019 1234 Last data filed at  09/07/2019 0600 Gross per 24 hour  Intake 1287.36 ml  Output 2175 ml  Net -887.64 ml   Filed Weights   09/05/19 0800 09/06/19 0534 09/07/19 0609  Weight: 77.8 kg 76.8 kg 75.7 kg    Examination:  General exam: Appears calm and comfortable  Respiratory system: Clear to auscultation. Respiratory effort normal. Cardiovascular system: S1 & S2 heard, irregularly irregular rate and rhythm. No JVD, murmurs, rubs, gallops or clicks. No pedal edema. Gastrointestinal system: Abdomen is nondistended, soft and nontender. No organomegaly or masses felt. Normal bowel sounds heard. Central nervous system: Alert and oriented. No  focal neurological deficits. Extremities: Symmetric 5 x 5 power. Skin: No rashes, lesions or ulcers.  Psychiatry: Judgement and insight appear normal. Mood & affect appropriate.   Data Reviewed: I have personally reviewed following labs and imaging studies  CBC: Recent Labs  Lab 09/03/19 1304 09/05/19 0306 09/06/19 0405 09/07/19 0449  WBC 7.7 7.1 6.5 6.4  NEUTROABS 5.3 5.0 4.2 4.2  HGB 12.1* 11.8* 11.6* 12.2*  HCT 38.8* 35.4* 36.7* 37.4*  MCV 89.0 86.6 88.2 87.8  PLT 195 178 185 197   Basic Metabolic Panel: Recent Labs  Lab 09/03/19 1304 09/04/19 0223 09/05/19 0306 09/06/19 0405 09/07/19 0449 09/07/19 0806  NA 138  --  136 136  --  137  K 4.0  --  3.9 4.3  --  3.9  CL 106  --  102 101  --  100  CO2 22  --  25 25  --  26  GLUCOSE 117*  --  109* 97  --  54*  BUN 42*  --  36* 30*  --  29*  CREATININE 1.32*  --  1.46* 1.48*  --  1.40*  CALCIUM 9.1  --  8.6* 8.6*  --  8.8*  MG  --  1.8 1.8 1.8 1.9  --    GFR: Estimated Creatinine Clearance: 55.6 mL/min (A) (by C-G formula based on SCr of 1.4 mg/dL (H)). Liver Function Tests: Recent Labs  Lab 09/03/19 1304 09/04/19 0051 09/06/19 0405  AST 101* 93* 77*  ALT 83* 81* 80*  ALKPHOS 72 67 61  BILITOT 2.0* 1.6* 0.9  PROT 7.2 6.4* 5.5*  ALBUMIN 3.7 3.2* 2.8*   No results for input(s): LIPASE, AMYLASE in the last 168 hours. No results for input(s): AMMONIA in the last 168 hours. Coagulation Profile: Recent Labs  Lab 09/03/19 1304  INR 1.6*   Cardiac Enzymes: No results for input(s): CKTOTAL, CKMB, CKMBINDEX, TROPONINI in the last 168 hours. BNP (last 3 results) No results for input(s): PROBNP in the last 8760 hours. HbA1C: No results for input(s): HGBA1C in the last 72 hours. CBG: No results for input(s): GLUCAP in the last 168 hours. Lipid Profile: No results for input(s): CHOL, HDL, LDLCALC, TRIG, CHOLHDL, LDLDIRECT in the last 72 hours. Thyroid Function Tests: No results for input(s): TSH, T4TOTAL,  FREET4, T3FREE, THYROIDAB in the last 72 hours. Anemia Panel: No results for input(s): VITAMINB12, FOLATE, FERRITIN, TIBC, IRON, RETICCTPCT in the last 72 hours. Sepsis Labs: No results for input(s): PROCALCITON, LATICACIDVEN in the last 168 hours.  Recent Results (from the past 240 hour(s))  SARS CORONAVIRUS 2 (TAT 6-24 HRS) Nasopharyngeal Nasopharyngeal Swab     Status: None   Collection Time: 09/03/19  1:04 PM   Specimen: Nasopharyngeal Swab  Result Value Ref Range Status   SARS Coronavirus 2 NEGATIVE NEGATIVE Final    Comment: (NOTE) SARS-CoV-2 target nucleic acids are NOT DETECTED. The  SARS-CoV-2 RNA is generally detectable in upper and lower respiratory specimens during the acute phase of infection. Negative results do not preclude SARS-CoV-2 infection, do not rule out co-infections with other pathogens, and should not be used as the sole basis for treatment or other patient management decisions. Negative results must be combined with clinical observations, patient history, and epidemiological information. The expected result is Negative. Fact Sheet for Patients: HairSlick.nohttps://www.fda.gov/media/138098/download Fact Sheet for Healthcare Providers: quierodirigir.comhttps://www.fda.gov/media/138095/download This test is not yet approved or cleared by the [place] FDA and  has been authorized for detection and/or diagnosis of SARS-CoV-2 by FDA under an Emergency Use Authorization (EUA). This EUA will remain  in effect (meaning this test can be used) for the duration of the COVID-19 declaration under Section 56 4(b)(1) of the Act, 21 U.S.C. section 360bbb-3(b)(1), unless the authorization is terminated or revoked sooner. Performed at [facility], [address] [place], [place] [address]       Radiology Studies: Nm Myocar Multi W/spect W/wall Motion / Ef  Result Date: 09/05/2019 CLINICAL DATA:  Heart failure EXAM: MYOCARDIAL IMAGING WITH SPECT (REST AND PHARMACOLOGIC-STRESS) GATED  LEFT VENTRICULAR WALL MOTION STUDY LEFT VENTRICULAR EJECTION FRACTION TECHNIQUE: Standard myocardial SPECT imaging was performed after resting intravenous injection of 10 mCi Tc-2623m tetrofosmin. Subsequently, intravenous infusion of Lexiscan was performed under the supervision of the Cardiology staff. At peak effect of the drug, 30 mCi Tc-5123m tetrofosmin was injected intravenously and standard myocardial SPECT imaging was performed. Quantitative gated imaging was also performed to evaluate left ventricular wall motion, and estimate left ventricular ejection fraction. COMPARISON:  None. FINDINGS: Perfusion: No decreased activity in the left ventricle on stress imaging to suggest reversible ischemia or infarction. Wall Motion: Diffuse severe hypokinesia. Left Ventricular Ejection Fraction: 21 % End diastolic volume 182 ml End systolic volume 144 ml IMPRESSION: 1. No reversible ischemia or infarction. 2. Diffuse severe hypokinesia. 3. Left ventricular ejection fraction 21% 4. Non invasive risk stratification*: High *2012 Appropriate Use Criteria for Coronary Revascularization Focused Update: J Am Coll Cardiol. 2012;59(9):857-881. http://content.dementiazones.comonlinejacc.org/article.aspx?articleid=1201161 Electronically Signed   By: Charlett NoseKevin  Dover M.D.   On: 09/05/2019 13:19   [place] Abdomen Limited  Result Date: 09/06/2019 CLINICAL DATA:  Abnormal liver function tests.  Hepatitis. EXAM: ULTRASOUND ABDOMEN LIMITED RIGHT UPPER QUADRANT COMPARISON:  None. FINDINGS: Gallbladder: The patient does appear to have a small amount of gallbladder sludge. No evidence of shadowing stones. No wall thickening or surrounding fluid. Negative Murphy sign. Common bile duct: Diameter: Dilated at 9.5 mm.  No ductal stone identified. Liver: No focal lesion identified. Within normal limits in parenchymal echogenicity. Portal vein is patent on color Doppler imaging with normal direction of blood flow towards the liver. Other: None. IMPRESSION: Liver  parenchyma itself appears normal. There is sludge in the gallbladder. The common duct is prominent measuring up to 9.5 mm. No ductal stone is visualized. The patient does not appear to have intrahepatic ductal dilatation. Therefore, the significance of this finding is uncertain. Electronically Signed   By: Paulina FusiMark  Shogry M.D.   On: 09/06/2019 08:15    Scheduled Meds: . amiodarone  200 mg Oral BID  . [START ON 09/14/2019] amiodarone  200 mg Oral Daily  . apixaban  5 mg Oral BID  . carvedilol  6.25 mg Oral BID WC  . digoxin  0.125 mg Oral Daily  . furosemide  40 mg Oral Daily  . [START ON 09/08/2019] influenza vaccine adjuvanted  0.5 mL Intramuscular Tomorrow-1000  . multivitamin with minerals  1 tablet Oral Daily  . sodium chloride flush  3 mL Intravenous Q12H   Continuous Infusions: . sodium chloride       LOS: 4 days   Time spent: 26 minutes   Hughie Closs, MD [facility]  09/07/2019, 12:34 PM   To contact the attending provider between 7A-7P or the covering provider during after hours 7P-7A, please log into the web site [email or web address] and use password [email or web address].

## 2019-09-07 NOTE — Progress Notes (Signed)
ANTICOAGULATION CONSULT NOTE - Follow-up Consult  Pharmacy Consult for Eliquis Indication: atrial fibrillation  No Known Allergies  Patient Measurements: Height: 6\' 2"  (188 cm) Weight: 166 lb 12.8 oz (75.7 kg) IBW/kg (Calculated) : 82.2  Vital Signs: Temp: 97.5 F (36.4 C) (10/18 0605) Temp Source: Oral (10/18 0605) BP: 108/78 (10/18 0605) Pulse Rate: 100 (10/18 0605)  Labs: Recent Labs    09/05/19 0306 09/06/19 0405 09/07/19 0449  HGB 11.8* 11.6* 12.2*  HCT 35.4* 36.7* 37.4*  PLT 178 185 197  CREATININE 1.46* 1.48*  --     Estimated Creatinine Clearance: 52.6 mL/min (A) (by C-G formula based on SCr of 1.48 mg/dL (H)).   Medical History: Past Medical History:  Diagnosis Date  . Arthritis   . Hepatitis    hx of 35 years ago     Medications:  Medications Prior to Admission  Medication Sig Dispense Refill Last Dose  . Lysine 500 MG TABS Take 500 mg by mouth daily.   Past Week at Unknown time  . Misc Natural Products (URINOZINC PO) Take 1 tablet by mouth daily.   Past Week at Unknown time  . Multiple Vitamin (MULTIVITAMIN WITH MINERALS) TABS tablet Take 1 tablet by mouth daily.   Past Week at Unknown time  . Multiple Vitamins-Minerals (EQ VISION FORMULA 50+ PO) Take 1 tablet by mouth daily.   09/03/2019 at Unknown time  . TURMERIC PO Take 1 tablet by mouth daily.   Past Week at Unknown time  . aspirin EC 325 MG EC tablet Take 1 tablet (325 mg total) by mouth 2 (two) times daily after a meal. (Patient not taking: Reported on 09/04/2019) 30 tablet 0 Not Taking at Unknown time   Scheduled:  . apixaban  5 mg Oral BID  . carvedilol  6.25 mg Oral BID WC  . digoxin  0.125 mg Oral Daily  . furosemide  40 mg Oral Daily  . multivitamin with minerals  1 tablet Oral Daily  . sodium chloride flush  3 mL Intravenous Q12H   Infusions:  . sodium chloride    . amiodarone 30 mg/hr (09/07/19 0352)    Assessment: 68yo male last saw MD 22yr ago, now c/o LE swelling and  coughing, found to be in Afib w/ RVR, previously on dilt gtt, now on amio gtt. No AC PTA. Pt developed acute pulmonary edema/acute systolic congestive heart failure, EF 25-30%. Pharmacy consulted for Apixaban dosing - dose app (Age<80, wt>60, Scr<1.5). Education and AVS completed on 09/04/19. Hgb 12.2; Plts 197; Scr 1,48 (BL~0.9)  Plan:  Continue Eliquis 5mg  PO BID Monitor for s/sx of bleeding   Lorel Monaco, PharmD PGY1 Ambulatory Care Resident [employer] # [phone]

## 2019-09-07 NOTE — Discharge Summary (Signed)
Physician Discharge Summary  Michael Noble OYD:[id number] DOB: 1951-07-12 DOA: 09/03/2019  PCP: Patient, No Pcp Per  Admit date: 09/03/2019 Discharge date: 09/07/2019  Admitted From: Home Disposition: Home  Recommendations for Outpatient Follow-up:  1. Follow up with PCP in 1-2 weeks 2. Follow with cardiology in 1 to 2 weeks 3. Please obtain BMP/CBC in one week 4. Please follow up on the following pending results:  Home Health: None Equipment/Devices: None  Discharge Condition: Stable CODE STATUS: Full code Diet recommendation: Cardiac  Subjective: Patient seen and examined.  Feels better.  No shortness of breath, chest pain or palpitation.  Wants to go home.  Brief/Interim Summary: Michael Noble a 68 y.o.malewith medical history significant ofremote hepatitis, arthritis, who presented to ED on 09/03/2019 with bilateral leg edema and shortness of breath of 4 days duration.  This is associated with mild dry cough but no chest pain, fever or chills.  No sick contact.  No other complaint.  Upon arrival to ED,pt was found to have BNP 660, negative COVID-19 test, AKI with creatinine 1.32, abnormal liver function (ALP 72, AST 101, ALT 83, total bilirubin 2.0), temperature normal, blood pressure 104/72, and atrial fibrillation with RVR.  He was started on Cardizem drip and admitted under hospitalist service.  Cardiology was consulted.  He was also in acute congestive heart failure.  Was a started on Lasix.  Patient underwent Myoview on 09/05/2019 which did not show any reversible ischemia.  He did have diffuse severe hypokinesia.  Echo showed 25% ejection fraction.  He was subsequently switched to amiodarone drip which was continued for 24 hours.  His heart rate improved.  Prior to amiodarone drip, he was running around 150s and currently he is running between 90 and 110.  His medications were adjusted by cardiology and he has been cleared by cardiology to be discharged today on following  new medications.  He is feeling better, no hypoxia, no chest pain no shortness of breath or any palpitation.  He will follow-up with cardiology in 1 to 2 weeks.  He was also started on Eliquis.  He has 1 month of a starter pack coupon.  Primary RN has verified from his pharmacy that they have plenty in his stock.   Discharge Diagnoses:  Principal Problem:   Atrial fibrillation with RVR (HCC) Active Problems:   Acute CHF (congestive heart failure) (HCC)   AKI (acute kidney injury) (HCC)   Abnormal LFTs    Discharge Instructions  Discharge Instructions    Discharge patient   Complete by: As directed    Discharge disposition: 01-Home or Self Care   Discharge patient date: 09/07/2019     Allergies as of 09/07/2019   No Known Allergies     Medication List    STOP taking these medications   aspirin 325 MG EC tablet     TAKE these medications   amiodarone 200 MG tablet Commonly known as: PACERONE Take 200 mg twice daily for 7 days until 09/14/2019 and then take 20 mg p.o. once daily.   apixaban 5 MG Tabs tablet Commonly known as: ELIQUIS Take 1 tablet (5 mg total) by mouth 2 (two) times daily.   carvedilol 6.25 MG tablet Commonly known as: COREG Take 1 tablet (6.25 mg total) by mouth 2 (two) times daily with a meal.   digoxin 0.125 MG tablet Commonly known as: LANOXIN Take 1 tablet (0.125 mg total) by mouth daily. Start taking on: September 08, 2019   EQ VISION FORMULA 50+ PO Take  1 tablet by mouth daily.   furosemide 40 MG tablet Commonly known as: LASIX Take 1 tablet (40 mg total) by mouth daily. Start taking on: September 08, 2019   Lysine 500 MG Tabs Take 500 mg by mouth daily.   multivitamin with minerals Tabs tablet Take 1 tablet by mouth daily.   potassium chloride 10 MEQ tablet Commonly known as: KLOR-CON Take 1 tablet (10 mEq total) by mouth 2 (two) times daily.   TURMERIC PO Take 1 tablet by mouth daily.   URINOZINC PO Take 1 tablet by mouth  daily.      Follow-up Information    Croitoru, Mihai, MD Follow up.   Specialty: Cardiology Why: Our office will call you to arrange for appointment to occur within the next 2 weeks.  Please answer your phone for unknown callers.  Contact information: [address] [place] [address] [phone]          No Known Allergies  Consultations: Cardiology   Procedures/Studies: Dg Chest 2 View  Result Date: 09/05/2019 CLINICAL DATA:  Cough EXAM: CHEST - 2 VIEW COMPARISON:  09/03/2019 FINDINGS: Small bilateral pleural effusions. Cardiomegaly with vascular congestion and interstitial pulmonary edema, increased compared to prior. Patchy atelectasis at the bases IMPRESSION: Cardiomegaly with vascular congestion and increased interstitial pulmonary edema. Small pleural effusions Electronically Signed   By: Jasmine Pang M.D.   On: 09/05/2019 01:37   Nm Myocar Multi W/spect W/wall Motion / Ef  Result Date: 09/05/2019 CLINICAL DATA:  Heart failure EXAM: MYOCARDIAL IMAGING WITH SPECT (REST AND PHARMACOLOGIC-STRESS) GATED LEFT VENTRICULAR WALL MOTION STUDY LEFT VENTRICULAR EJECTION FRACTION TECHNIQUE: Standard myocardial SPECT imaging was performed after resting intravenous injection of 10 mCi Tc-73m tetrofosmin. Subsequently, intravenous infusion of Lexiscan was performed under the supervision of the Cardiology staff. At peak effect of the drug, 30 mCi Tc-5m tetrofosmin was injected intravenously and standard myocardial SPECT imaging was performed. Quantitative gated imaging was also performed to evaluate left ventricular wall motion, and estimate left ventricular ejection fraction. COMPARISON:  None. FINDINGS: Perfusion: No decreased activity in the left ventricle on stress imaging to suggest reversible ischemia or infarction. Wall Motion: Diffuse severe hypokinesia. Left Ventricular Ejection Fraction: 21 % End diastolic volume 182 ml End systolic volume 144 ml IMPRESSION: 1. No  reversible ischemia or infarction. 2. Diffuse severe hypokinesia. 3. Left ventricular ejection fraction 21% 4. Non invasive risk stratification*: High *2012 Appropriate Use Criteria for Coronary Revascularization Focused Update: J Am Coll Cardiol. 2012;59(9):857-881. http://content.dementiazones.com.aspx?articleid=1201161 Electronically Signed   By: Charlett Nose M.D.   On: 09/05/2019 13:19   US Abdomen Limited  Result Date: 09/06/2019 CLINICAL DATA:  Abnormal liver function tests.  Hepatitis. EXAM: ULTRASOUND ABDOMEN LIMITED RIGHT UPPER QUADRANT COMPARISON:  None. FINDINGS: Gallbladder: The patient does appear to have a small amount of gallbladder sludge. No evidence of shadowing stones. No wall thickening or surrounding fluid. Negative Murphy sign. Common bile duct: Diameter: Dilated at 9.5 mm.  No ductal stone identified. Liver: No focal lesion identified. Within normal limits in parenchymal echogenicity. Portal vein is patent on color Doppler imaging with normal direction of blood flow towards the liver. Other: None. IMPRESSION: Liver parenchyma itself appears normal. There is sludge in the gallbladder. The common duct is prominent measuring up to 9.5 mm. No ductal stone is visualized. The patient does not appear to have intrahepatic ductal dilatation. Therefore, the significance of this finding is uncertain. Electronically Signed   By: Paulina Fusi M.D.   On: 09/06/2019 08:15  Dg Chest Port 1 View  Result Date: 09/03/2019 CLINICAL DATA:  Bilateral leg swelling for 4 days. EXAM: PORTABLE CHEST 1 VIEW COMPARISON:  None. FINDINGS: Cardiac silhouette is borderline enlarged. No mediastinal or hilar masses. Mild interstitial thickening noted most evident right peripheral lung base. Lungs otherwise clear. Suspect small right effusion. No pneumothorax. Skeletal structures are grossly intact. IMPRESSION: 1. Borderline cardiomegaly, mild interstitial thickening and probable small right effusion. Suspect  mild congestive heart failure. Electronically Signed   By: Amie Portlandavid  Ormond M.D.   On: 09/03/2019 13:30      Discharge Exam: Vitals:   09/07/19 0039 09/07/19 0605  BP: (!) 83/67 108/78  Pulse:  100  Resp:    Temp:  (!) 97.5 F (36.4 C)  SpO2:  100%   Vitals:   09/06/19 2222 09/07/19 0039 09/07/19 0605 09/07/19 0609  BP: 114/67 (!) 83/67 108/78   Pulse: (!) 56  100   Resp: 17     Temp: (!) 97.4 F (36.3 C)  (!) 97.5 F (36.4 C)   TempSrc: Oral  Oral   SpO2: 96%  100%   Weight:    75.7 kg  Height:        General: Pt is alert, awake, not in acute distress Cardiovascular: Irregularly irregular rate and rhythm, S1/S2 +, no rubs, no gallops Respiratory: CTA bilaterally, no wheezing, no rhonchi Abdominal: Soft, NT, ND, bowel sounds + Extremities: no edema, no cyanosis    The results of significant diagnostics from this hospitalization (including imaging, microbiology, ancillary and laboratory) are listed below for reference.     Microbiology: Recent Results (from the past 240 hour(s))  SARS CORONAVIRUS 2 (TAT 6-24 HRS) Nasopharyngeal Nasopharyngeal Swab     Status: None   Collection Time: 09/03/19  1:04 PM   Specimen: Nasopharyngeal Swab  Result Value Ref Range Status   SARS Coronavirus 2 NEGATIVE NEGATIVE Final    Comment: (NOTE) SARS-CoV-2 target nucleic acids are NOT DETECTED. The SARS-CoV-2 RNA is generally detectable in upper and lower respiratory specimens during the acute phase of infection. Negative results do not preclude SARS-CoV-2 infection, do not rule out co-infections with other pathogens, and should not be used as the sole basis for treatment or other patient management decisions. Negative results must be combined with clinical observations, patient history, and epidemiological information. The expected result is Negative. Fact Sheet for Patients: HairSlick.nohttps://www.fda.gov/media/138098/download Fact Sheet for Healthcare  Providers: quierodirigir.comhttps://www.fda.gov/media/138095/download This test is not yet approved or cleared by the [place] FDA and  has been authorized for detection and/or diagnosis of SARS-CoV-2 by FDA under an Emergency Use Authorization (EUA). This EUA will remain  in effect (meaning this test can be used) for the duration of the COVID-19 declaration under Section 56 4(b)(1) of the Act, 21 U.S.C. section 360bbb-3(b)(1), unless the authorization is terminated or revoked sooner. Performed at [facility], [address] [place], [place] [address]      Labs: BNP (last 3 results) Recent Labs    09/03/19 1304  BNP 667.1*   Basic Metabolic Panel: Recent Labs  Lab 09/03/19 1304 09/04/19 0223 09/05/19 0306 09/06/19 0405 09/07/19 0449 09/07/19 0806  NA 138  --  136 136  --  137  K 4.0  --  3.9 4.3  --  3.9  CL 106  --  102 101  --  100  CO2 22  --  25 25  --  26  GLUCOSE 117*  --  109* 97  --  54*  BUN 42*  --  36* 30*  --  29*  CREATININE 1.32*  --  1.46* 1.48*  --  1.40*  CALCIUM 9.1  --  8.6* 8.6*  --  8.8*  MG  --  1.8 1.8 1.8 1.9  --    Liver Function Tests: Recent Labs  Lab 09/03/19 1304 09/04/19 0051 09/06/19 0405  AST 101* 93* 77*  ALT 83* 81* 80*  ALKPHOS 72 67 61  BILITOT 2.0* 1.6* 0.9  PROT 7.2 6.4* 5.5*  ALBUMIN 3.7 3.2* 2.8*   No results for input(s): LIPASE, AMYLASE in the last 168 hours. No results for input(s): AMMONIA in the last 168 hours. CBC: Recent Labs  Lab 09/03/19 1304 09/05/19 0306 09/06/19 0405 09/07/19 0449  WBC 7.7 7.1 6.5 6.4  NEUTROABS 5.3 5.0 4.2 4.2  HGB 12.1* 11.8* 11.6* 12.2*  HCT 38.8* 35.4* 36.7* 37.4*  MCV 89.0 86.6 88.2 87.8  PLT 195 178 185 197   Cardiac Enzymes: No results for input(s): CKTOTAL, CKMB, CKMBINDEX, TROPONINI in the last 168 hours. BNP: Invalid input(s): POCBNP CBG: No results for input(s): GLUCAP in the last 168 hours. D-Dimer No results for input(s): DDIMER in the last 72 hours. Hgb A1c No  results for input(s): HGBA1C in the last 72 hours. Lipid Profile No results for input(s): CHOL, HDL, LDLCALC, TRIG, CHOLHDL, LDLDIRECT in the last 72 hours. Thyroid function studies No results for input(s): TSH, T4TOTAL, T3FREE, THYROIDAB in the last 72 hours.  Invalid input(s): FREET3 Anemia work up No results for input(s): VITAMINB12, FOLATE, FERRITIN, TIBC, IRON, RETICCTPCT in the last 72 hours. Urinalysis No results found for: COLORURINE, APPEARANCEUR, [place], [place], [place], [place], [place], [place], PROTEINUR, UROBILINOGEN, NITRITE, LEUKOCYTESUR Sepsis Labs Invalid input(s): PROCALCITONIN,  WBC,  LACTICIDVEN Microbiology Recent Results (from the past 240 hour(s))  SARS CORONAVIRUS 2 (TAT 6-24 HRS) Nasopharyngeal Nasopharyngeal Swab     Status: None   Collection Time: 09/03/19  1:04 PM   Specimen: Nasopharyngeal Swab  Result Value Ref Range Status   SARS Coronavirus 2 NEGATIVE NEGATIVE Final    Comment: (NOTE) SARS-CoV-2 target nucleic acids are NOT DETECTED. The SARS-CoV-2 RNA is generally detectable in upper and lower respiratory specimens during the acute phase of infection. Negative results do not preclude SARS-CoV-2 infection, do not rule out co-infections with other pathogens, and should not be used as the sole basis for treatment or other patient management decisions. Negative results must be combined with clinical observations, patient history, and epidemiological information. The expected result is Negative. Fact Sheet for Patients: SugarRoll.be Fact Sheet for Healthcare Providers: https://www.woods-mathews.com/ This test is not yet approved or cleared by the [place] FDA and  has been authorized for detection and/or diagnosis of SARS-CoV-2 by FDA under an Emergency Use Authorization (EUA). This EUA will remain  in effect (meaning this test can be used) for the duration of the COVID-19 declaration under Section  56 4(b)(1) of the Act, 21 U.S.C. section 360bbb-3(b)(1), unless the authorization is terminated or revoked sooner. Performed at [facility], [place] [address] [place], [place] [address]      Time coordinating discharge: Over 30 minutes  SIGNED:   Darliss Cheney, MD  [facility] 09/07/2019, 2:20 PM  If 7PM-7AM, please contact night-coverage [email or web address] Password [email or web address]

## 2019-09-22 NOTE — Progress Notes (Signed)
Cardiology Office Note   Date:  09/24/2019   ID:  Michael Noble, DOB Apr 29, 1951, MRN [id number]  PCP:  Patient, No Pcp Per  Cardiologist:  Thurmon Fair, MD EP: None  Chief Complaint  Patient presents with  . Hospitalization Follow-up    atrial fibrillation and CHF      History of Present Illness: Michael Noble is a 68 y.o. male with a PMH of chronic combined CHF, persistent atrial fibrillation, and CKD stage 3, who presents for post-hospitalization follow-up.  Patient was admitted to the hospital from 09/03/2019-09/07/2019 where he was found to have new onset atrial fibrillation with severe LAE suggesting chronicity. He was started on amiodarone, carvedilol, and digoxin for rate/rhythm control, and eliquis for stroke ppx. He was recommended for outpatient DCCV after 3 weeks of anticoagulation if he remained in atrial fibrillation. Additionally he was found to have acute combined CHF with EF 25-30%. NST was without ischemia. Likely tachycardia mediated. He was diuresed with lasix with target dry weight of 165lbs. BP limited addition of ARB therapy.   He presents today for post-hospital follow-up. He reports doing well since discharge from the hospital. Weights, blood pressures, and heart rates have been stable. No complaints of chest pain, SOB, palpitations, orthopnea, PND, LE edema, dizziness, lightheadedness, syncope, hematuria, hematochezia, or melena. He is hopeful to eliminate some of his medications going forward. He reports compliance with a low sodium diet. Only complaint today is dry eye for which he has been using saline drops.      Past Medical History:  Diagnosis Date  . Arthritis   . Hepatitis    hx of 35 years ago     Past Surgical History:  Procedure Laterality Date  . TOTAL HIP ARTHROPLASTY Left 06/25/2015   Procedure: LEFT TOTAL HIP ARTHROPLASTY ANTERIOR APPROACH;  Surgeon: Kathryne Hitch, MD;  Location: [facility]S;  Service: Orthopedics;  Laterality:  Left;     Current Outpatient Medications  Medication Sig Dispense Refill  . amiodarone (PACERONE) 200 MG tablet Take 200 mg twice daily for 7 days until 09/14/2019 and then take 20 mg p.o. once daily. 38 tablet 0  . apixaban (ELIQUIS) 5 MG TABS tablet Take 1 tablet (5 mg total) by mouth 2 (two) times daily. 60 tablet 0  . carvedilol (COREG) 6.25 MG tablet Take 1 tablet (6.25 mg total) by mouth 2 (two) times daily with a meal. 60 tablet 0  . furosemide (LASIX) 20 MG tablet Take 1 tablet (20 mg total) by mouth daily. 30 tablet 11  . Lysine 500 MG TABS Take 500 mg by mouth daily.    . Misc Natural Products (URINOZINC PO) Take 1 tablet by mouth daily.    . Multiple Vitamin (MULTIVITAMIN WITH MINERALS) TABS tablet Take 1 tablet by mouth daily.    . Multiple Vitamins-Minerals (EQ VISION FORMULA 50+ PO) Take 1 tablet by mouth daily.    . potassium chloride (KLOR-CON) 10 MEQ tablet Take 1 tablet (10 mEq total) by mouth daily. 30 tablet 11  . TURMERIC PO Take 1 tablet by mouth daily.    Marland Kitchen losartan (COZAAR) 25 MG tablet Take 1 tablet (25 mg total) by mouth daily. 30 tablet 11   No current facility-administered medications for this visit.     Allergies:   Patient has no known allergies.    Social History:  The patient  reports that he has quit smoking. He has quit using smokeless tobacco.  His smokeless tobacco use included chew. He reports  that he does not drink alcohol or use drugs.   Family History:  The patient's family history includes Atrial fibrillation in his father; Heart disease in his mother.    ROS:  Please see the history of present illness.   Otherwise, review of systems are positive for none.   All other systems are reviewed and negative.    PHYSICAL EXAM: VS:  BP 122/82   Pulse 67   Temp 98.2 F (36.8 C)   Ht 6\' 2"  (1.88 m)   Wt 159 lb 12.8 oz (72.5 kg)   BMI 20.52 kg/m  , BMI Body mass index is 20.52 kg/m. GEN: Well nourished, well developed, in no acute distress  HEENT: sclera mildly erythematous Neck: no JVD, carotid bruits, or masses Cardiac: RRR; no murmurs, rubs, or gallops, no edema  Respiratory:  clear to auscultation bilaterally, normal work of breathing GI: soft, nontender, nondistended, + BS MS: no deformity or atrophy Skin: warm and dry, no rash Neuro:  Strength and sensation are intact Psych: euthymic mood, full affect   EKG:  EKG is ordered today. The ekg ordered today demonstrates sinus rhythm with 1st degree AV block, rate 67 bpm, no STE/D, no TWI; previously in atrial fibrillation  Recent Labs: 09/03/2019: B Natriuretic Peptide 667.1 09/04/2019: TSH 1.482 09/06/2019: ALT 80 09/07/2019: BUN 29; Creatinine, Ser 1.40; Hemoglobin 12.2; Magnesium 1.9; Platelets 197; Potassium 3.9; Sodium 137    Lipid Panel    Component Value Date/Time   CHOL 83 09/04/2019 0223   TRIG 41 09/04/2019 0223   HDL 23 (L) 09/04/2019 0223   CHOLHDL 3.6 09/04/2019 0223   VLDL 8 09/04/2019 0223   LDLCALC 52 09/04/2019 0223      Wt Readings from Last 3 Encounters:  09/24/19 159 lb 12.8 oz (72.5 kg)  09/07/19 166 lb 12.8 oz (75.7 kg)  06/25/15 165 lb (74.8 kg)      Other studies Reviewed: Additional studies/ records that were reviewed today include:   Echocardiogram 09/04/2019 1. Left ventricular ejection fraction, by visual estimation, is 25 to 30%. The left ventricle has severely decreased function. Mildly increased left ventricular size. There is no left ventricular hypertrophy. 2. Left ventricular diastolic Doppler parameters are indeterminate pattern of LV diastolic filling. 3. Global right ventricle has normal systolic function.The right ventricular size is normal. No increase in right ventricular wall thickness. 4. Left atrial size was severely dilated. 5. Right atrial size was moderately dilated. 6. The mitral valve is normal in structure. Mild mitral valve regurgitation. No evidence of mitral stenosis. 7. The tricuspid valve is  normal in structure. Tricuspid valve regurgitation moderate. 8. The aortic valve is normal in structure. Aortic valve regurgitation is mild to moderate by color flow Doppler. Structurally normal aortic valve, with no evidence of sclerosis or stenosis. 9. The pulmonic valve was normal in structure. Pulmonic valve regurgitation is not visualized by color flow Doppler. 10. Mildly elevated pulmonary artery systolic pressure. 11. The inferior vena cava is dilated in size with <50% respiratory variability, suggesting right atrial pressure of 15 mmHg.  Lexiscan Myoview 09/05/2019 FINDINGS: Perfusion: No decreased activity in the left ventricle on stress imaging to suggest reversible ischemia or infarction. Wall Motion: Diffuse severe hypokinesia. Left Ventricular Ejection Fraction: 21 %  IMPRESSION: 1. No reversible ischemia or infarction. 2. Diffuse severe hypokinesia. 3. Left ventricular ejection fraction 21% 4. Non invasive risk stratification*: High    ASSESSMENT AND PLAN:  1. Paroxysmal atrial fibrillation: EKG with sinus rhythm with 1st degree AV  block today. No complaints of palpitations. No complaints of bleeding with eliquis.  - Will continue carvedilol to 6.25mg  BID for now - HR's primarily in the 60s-70s at home but as low as 59 bpm.  - Continue amiodarone 200mg  daily - could consider stopping after 1 month if he maintains sinus rhytm - Will stop digoxin at this time  - Continue eliquis for stroke ppx - assistance paperwork filled out. He anticipate prescription coverage starting 11/2019  2. Chronic combined CHF: appears euvolemic on exam today. Weights have been stable since discharge. BP limits ability to add spironolactone.  - Will continue carvedilol 6.25mg  BID - Will cut lasix and potassium supplement in half - possible he could transition to prn dosing if weights remain stable over the next month - Will start losartan 25mg  daily as BP has been stable - Will check BMET  today and in 2 weeks for close monitor of his renal function - Continue low sodium diet - Will plan to check echo 11/2019 to monitor for improvement in EF  3. CKD stage 3: Cr 1.4 at discharge. - Will check BMET today and in 2 weeks with addition of losartan to monitor renal function closely.   Current medicines are reviewed at length with the patient today.  The patient does not have concerns regarding medicines.  The following changes have been made:  As above  Labs/ tests ordered today include:   Orders Placed This Encounter  Procedures  . Basic metabolic panel  . Basic metabolic panel  . EKG 12-Lead  . ECHOCARDIOGRAM COMPLETE     Disposition:   FU with Dr. Sallyanne Kuster or myself in 1 month  Signed, Abigail Butts, PA-C  09/24/2019 12:02 PM

## 2019-09-24 ENCOUNTER — Telehealth: Payer: Self-pay | Admitting: Medical

## 2019-09-24 ENCOUNTER — Other Ambulatory Visit: Payer: Self-pay

## 2019-09-24 ENCOUNTER — Encounter: Payer: Self-pay | Admitting: Medical

## 2019-09-24 ENCOUNTER — Ambulatory Visit (INDEPENDENT_AMBULATORY_CARE_PROVIDER_SITE_OTHER): Payer: Medicare Other | Admitting: Medical

## 2019-09-24 VITALS — BP 122/82 | HR 67 | Temp 98.2°F | Ht 74.0 in | Wt 159.8 lb

## 2019-09-24 DIAGNOSIS — I5043 Acute on chronic combined systolic (congestive) and diastolic (congestive) heart failure: Secondary | ICD-10-CM

## 2019-09-24 DIAGNOSIS — I480 Paroxysmal atrial fibrillation: Secondary | ICD-10-CM | POA: Diagnosis not present

## 2019-09-24 DIAGNOSIS — N1831 Chronic kidney disease, stage 3a: Secondary | ICD-10-CM

## 2019-09-24 LAB — BASIC METABOLIC PANEL
BUN/Creatinine Ratio: 30 — ABNORMAL HIGH (ref 10–24)
BUN: 41 mg/dL — ABNORMAL HIGH (ref 8–27)
CO2: 26 mmol/L (ref 20–29)
Calcium: 10.3 mg/dL — ABNORMAL HIGH (ref 8.6–10.2)
Chloride: 97 mmol/L (ref 96–106)
Creatinine, Ser: 1.37 mg/dL — ABNORMAL HIGH (ref 0.76–1.27)
GFR calc Af Amer: 62 mL/min/{1.73_m2} (ref >59)
GFR calc non Af Amer: 53 mL/min/{1.73_m2} — ABNORMAL LOW (ref >59)
Glucose: 96 mg/dL (ref 65–99)
Potassium: 5.7 mmol/L — ABNORMAL HIGH (ref 3.5–5.2)
Sodium: 138 mmol/L (ref 134–144)

## 2019-09-24 MED ORDER — LOSARTAN POTASSIUM 25 MG PO TABS: 25.0000 mg | ORAL_TABLET | Freq: Every day | ORAL | 11 refills | Status: DC

## 2019-09-24 MED ORDER — POTASSIUM CHLORIDE ER 10 MEQ PO TBCR: 10.0000 meq | EXTENDED_RELEASE_TABLET | Freq: Every day | ORAL | 11 refills | Status: DC

## 2019-09-24 MED ORDER — FUROSEMIDE 20 MG PO TABS: 20.0000 mg | ORAL_TABLET | Freq: Every day | ORAL | 11 refills | Status: DC

## 2019-09-24 NOTE — Telephone Encounter (Signed)
Spoke with the husband who gave permission to speak with his wife. On the patient's instructions the Amiodarone stated to take 20 mg. She was calling to verify that it was supposed to be 200 mg. She has been advised that it is 200 mg once daily.   amiodarone 200 MG tablet Commonly known as: PACERONE Take 200 mg twice daily for 7 days until 09/14/2019 and then take 20 mg p.o. once daily.

## 2019-09-24 NOTE — Patient Instructions (Signed)
Medication Instructions:   STOP Digoxin  START Losartan 25 mg daily  DECREASE LASIX TO 20 MG DAILY  DECREASE POTASSIUM TO 10 MEQ DAILY  *If you need a refill on your cardiac medications before your next appointment, please call your pharmacy*  Lab Work: You will need to have labs (blood work) drawn today and again in 2 weeks:  BMET  If you have labs (blood work) drawn today and your tests are completely normal, you will receive your results only by: Marland Kitchen MyChart Message (if you have MyChart) OR . A paper copy in the mail If you have any lab test that is abnormal or we need to change your treatment, we will call you to review the results.  Testing/Procedures: Your physician has requested that you have an echocardiogram. Echocardiography is a painless test that uses sound waves to create images of your heart. It provides your doctor with information about the size and shape of your heart and how well your heart's chambers and valves are working. This procedure takes approximately one hour. There are no restrictions for this procedure.   Please schedule for January   Follow-Up: At [facility], you and your health needs are our priority.  As part of our continuing mission to provide you with exceptional heart care, we have created designated Provider Care Teams.  These Care Teams include your primary Cardiologist (physician) and Advanced Practice Providers (APPs -  Physician Assistants and Nurse Practitioners) who all work together to provide you with the care you need, when you need it.  Your next appointment:   1 month   The format for your next appointment:   Either In Person or Virtual  Provider:   Sanda Klein, MD  Other Instructions

## 2019-09-24 NOTE — Telephone Encounter (Signed)
Left a message for the patient to call back. The wife is not listed on the dpr.

## 2019-09-24 NOTE — Telephone Encounter (Signed)
New Message:     Pt saw Daleen Snook this morning. Wife have a question about pt's Amiodarone.

## 2019-09-24 NOTE — Progress Notes (Signed)
Nice. (Dr. C liked "sinus rhythm")

## 2019-09-25 ENCOUNTER — Other Ambulatory Visit: Payer: Self-pay

## 2019-09-25 DIAGNOSIS — N1831 Chronic kidney disease, stage 3a: Secondary | ICD-10-CM

## 2019-09-25 NOTE — Progress Notes (Signed)
Order placed per Roby Lofts, PA-C

## 2019-09-25 NOTE — Progress Notes (Signed)
The patient has been notified of the result and verbalized understanding.  All questions (if any) were answered. Jacqulynn Cadet, CMA 09/25/2019 1:10 PM

## 2019-09-29 ENCOUNTER — Other Ambulatory Visit: Payer: Self-pay

## 2019-09-29 DIAGNOSIS — N1831 Chronic kidney disease, stage 3a: Secondary | ICD-10-CM

## 2019-09-29 LAB — BASIC METABOLIC PANEL
BUN/Creatinine Ratio: 29 — ABNORMAL HIGH (ref 10–24)
BUN: 39 mg/dL — ABNORMAL HIGH (ref 8–27)
CO2: 25 mmol/L (ref 20–29)
Calcium: 9.5 mg/dL (ref 8.6–10.2)
Chloride: 102 mmol/L (ref 96–106)
Creatinine, Ser: 1.35 mg/dL — ABNORMAL HIGH (ref 0.76–1.27)
GFR calc Af Amer: 63 mL/min/{1.73_m2} (ref >59)
GFR calc non Af Amer: 54 mL/min/{1.73_m2} — ABNORMAL LOW (ref >59)
Glucose: 64 mg/dL — ABNORMAL LOW (ref 65–99)
Potassium: 4.8 mmol/L (ref 3.5–5.2)
Sodium: 139 mmol/L (ref 134–144)

## 2019-09-30 ENCOUNTER — Telehealth: Payer: Self-pay | Admitting: Cardiovascular Disease

## 2019-09-30 NOTE — Telephone Encounter (Signed)
Pt c/o medication issue:  1. Name of Medication:  amiodarone (PACERONE) 200 MG tablet losartan (COZAAR) 25 MG tablet  2. How are you currently taking this medication (dosage and times per day)? As  directed  3. Are you having a reaction (difficulty breathing--STAT)? no  4. What is your medication issue? Patient has irregular HR.   Patient was in the hospital with CHF and Afib. He did well on the medications he was put on in the hospital. His HR was low but consistent after discharge. He had an appt with the PA last week, and the PA adjusted his medication. Since the medication adjustment, the  Patient has had irregular HR, and they have even gone high for him. Patient and wife want to know what medications he may need to adjust to get his HR back to normal  STAT if HR is under 50 or over 120 (normal HR is 60-100 beats per minute)  1) What is your heart rate? 95  2) Do you have a log of your heart rate readings (document readings)?   77, 122, 85, 92,72, 95 starting last night leading up to this morning  Regular HR for the pt is in the mid 60s.  3) Do you have any other symptoms? No

## 2019-09-30 NOTE — Telephone Encounter (Signed)
Call the patient back. He stated that he felt fine now and was in normal rhythm. He has been set up for an appointment on 11/12 with Dr. Sallyanne Kuster. The patient has verbalized his understanding.

## 2019-09-30 NOTE — Telephone Encounter (Signed)
Will try to bring him in on Thursday for ECG

## 2019-09-30 NOTE — Telephone Encounter (Signed)
Fwd to provider for advice.  

## 2019-09-30 NOTE — Telephone Encounter (Signed)
Add on Thursday please

## 2019-10-02 ENCOUNTER — Other Ambulatory Visit: Payer: Self-pay

## 2019-10-02 ENCOUNTER — Encounter: Payer: Self-pay | Admitting: Cardiovascular Disease

## 2019-10-02 ENCOUNTER — Ambulatory Visit (INDEPENDENT_AMBULATORY_CARE_PROVIDER_SITE_OTHER): Payer: Medicare Other | Admitting: Cardiovascular Disease

## 2019-10-02 VITALS — BP 134/91 | HR 62 | Temp 97.5°F | Ht 74.0 in | Wt 167.8 lb

## 2019-10-02 DIAGNOSIS — I480 Paroxysmal atrial fibrillation: Secondary | ICD-10-CM | POA: Diagnosis not present

## 2019-10-02 DIAGNOSIS — I5022 Chronic systolic (congestive) heart failure: Secondary | ICD-10-CM

## 2019-10-02 DIAGNOSIS — Z7901 Long term (current) use of anticoagulants: Secondary | ICD-10-CM | POA: Diagnosis not present

## 2019-10-02 DIAGNOSIS — I484 Atypical atrial flutter: Secondary | ICD-10-CM

## 2019-10-02 DIAGNOSIS — N1831 Chronic kidney disease, stage 3a: Secondary | ICD-10-CM

## 2019-10-02 MED ORDER — AMIODARONE HCL 200 MG PO TABS: 200.0000 mg | ORAL_TABLET | Freq: Every day | ORAL | 11 refills | Status: DC

## 2019-10-02 NOTE — Patient Instructions (Addendum)
Medication Instructions:  INCREASE the Amiodarone to 400 mg (2 of the 200 mg tablets) for two weeks then go back to 200 mg once daily.  *If you need a refill on your cardiac medications before your next appointment, please call your pharmacy*  Lab Work: None ordered If you have labs (blood work) drawn today and your tests are completely normal, you will receive your results only by: Marland Kitchen MyChart Message (if you have MyChart) OR . A paper copy in the mail If you have any lab test that is abnormal or we need to change your treatment, we will call you to review the results.  Testing/Procedures: None ordered  Follow-Up: At [facility], you and your health needs are our priority.  As part of our continuing mission to provide you with exceptional heart care, we have created designated Provider Care Teams.  These Care Teams include your primary Cardiologist (physician) and Advanced Practice Providers (APPs -  Physician Assistants and Nurse Practitioners) who all work together to provide you with the care you need, when you need it.  Your next appointment:   Follow up in 3 weeks with Dr. Sallyanne Kuster or Roby Lofts, PA

## 2019-10-04 NOTE — Progress Notes (Signed)
Cardiology Office Note:    Date:  10/04/2019   ID:  Michael Noble, DOB 1951/01/22, MRN [id number]  PCP:  Patient, No Pcp Per  Cardiologist:  Sanda Klein, MD  Electrophysiologist:  None   Referring MD: No ref. provider found   Chief Complaint  Patient presents with  . Congestive Heart Failure  . Atrial Fibrillation    History of Present Illness:    Michael Noble is a 68 y.o. male with a hx of recent hospitalization (Oct 14-18, 2020) for atrial fibrillation rapid ventricular response and new onset congestive heart failure.  Left ventricular systolic function was severely depressed with EF of 25-30%, but he did not have any evidence of ischemia or scar on nuclear stress testing.  His left atrium was severely dilated but he did not have any significant valvular abnormalities.  Contrast based procedures were avoided due to elevated creatinine (1.4), absence of angina pectoris and virtually no coronary risk factors.  On November 4, surprisingly, he was back in sinus rhythm, after initiation of amiodarone therapy and anticoagulation.  Today he presents in atrial flutter with 2: 1 AV block and is completely unaware of the arrhythmia.  His heart rate is 123 bpm.  He feels well.  The patient specifically denies any chest pain at rest exertion, dyspnea at rest or with exertion, orthopnea, paroxysmal nocturnal dyspnea, syncope, palpitations, focal neurological deficits, intermittent claudication, lower extremity edema, unexplained weight gain, cough, hemoptysis or wheezing.  He has not had falls, injuries or bleeding problems.  Past Medical History:  Diagnosis Date  . Arthritis   . Hepatitis    hx of 35 years ago     Past Surgical History:  Procedure Laterality Date  . TOTAL HIP ARTHROPLASTY Left 06/25/2015   Procedure: LEFT TOTAL HIP ARTHROPLASTY ANTERIOR APPROACH;  Surgeon: Mcarthur Rossetti, MD;  Location: [facility]S;  Service: Orthopedics;  Laterality: Left;    Current  Medications: Current Meds  Medication Sig  . amiodarone (PACERONE) 200 MG tablet Take 1 tablet (200 mg total) by mouth daily.  Marland Kitchen apixaban (ELIQUIS) 5 MG TABS tablet Take 1 tablet (5 mg total) by mouth 2 (two) times daily.  . carvedilol (COREG) 6.25 MG tablet Take 1 tablet (6.25 mg total) by mouth 2 (two) times daily with a meal.  . furosemide (LASIX) 20 MG tablet Take 1 tablet (20 mg total) by mouth daily.  Marland Kitchen Lysine 500 MG TABS Take 500 mg by mouth daily.  . Misc Natural Products (URINOZINC PO) Take 1 tablet by mouth daily.  . Multiple Vitamin (MULTIVITAMIN WITH MINERALS) TABS tablet Take 1 tablet by mouth daily.  . Multiple Vitamins-Minerals (EQ VISION FORMULA 50+ PO) Take 1 tablet by mouth daily.  . TURMERIC PO Take 1 tablet by mouth daily.  . [DISCONTINUED] amiodarone (PACERONE) 200 MG tablet Take 200 mg twice daily for 7 days until 09/14/2019 and then take 20 mg p.o. once daily.  . [DISCONTINUED] losartan (COZAAR) 25 MG tablet Take 1 tablet (25 mg total) by mouth daily.     Allergies:   Patient has no known allergies.   Social History   Socioeconomic History  . Marital status: Married    Spouse name: Not on file  . Number of children: Not on file  . Years of education: Not on file  . Highest education level: Not on file  Occupational History  . Not on file  Social Needs  . Financial resource strain: Not on file  . Food insecurity  Worry: Not on file    Inability: Not on file  . Transportation needs    Medical: Not on file    Non-medical: Not on file  Tobacco Use  . Smoking status: Former Games developer  . Smokeless tobacco: Former Neurosurgeon    Types: Chew  Substance and Sexual Activity  . Alcohol use: No  . Drug use: No  . Sexual activity: Not on file  Lifestyle  . Physical activity    Days per week: Not on file    Minutes per session: Not on file  . Stress: Not on file  Relationships  . Social Musician on phone: Not on file    Gets together: Not on file     Attends religious service: Not on file    Active member of club or organization: Not on file    Attends meetings of clubs or organizations: Not on file    Relationship status: Not on file  Other Topics Concern  . Not on file  Social History Narrative  . Not on file     Family History: The patient's family history includes Atrial fibrillation in his father; Heart disease in his mother.  ROS:   Please see the history of present illness.     All other systems reviewed and are negative.  EKGs/Labs/Other Studies Reviewed:    The following studies were reviewed today: Echocardiogram 09/04/2019  1. Left ventricular ejection fraction, by visual estimation, is 25 to 30%. The left ventricle has severely decreased function. Mildly increased left ventricular size. There is no left ventricular hypertrophy.  2. Left ventricular diastolic Doppler parameters are indeterminate pattern of LV diastolic filling.  3. Global right ventricle has normal systolic function.The right ventricular size is normal. No increase in right ventricular wall thickness.  4. Left atrial size was severely dilated.  5. Right atrial size was moderately dilated.  6. The mitral valve is normal in structure. Mild mitral valve regurgitation. No evidence of mitral stenosis.  7. The tricuspid valve is normal in structure. Tricuspid valve regurgitation moderate.  8. The aortic valve is normal in structure. Aortic valve regurgitation is mild to moderate by color flow Doppler. Structurally normal aortic valve, with no evidence of sclerosis or stenosis.  9. The pulmonic valve was normal in structure. Pulmonic valve regurgitation is not visualized by color flow Doppler. 10. Mildly elevated pulmonary artery systolic pressure. 11. The inferior vena cava is dilated in size with <50% respiratory variability, suggesting right atrial pressure of 15 mmHg.  Lexiscan Myoview 09/05/2019 IMPRESSION: 1. No reversible ischemia or infarction.   2. Diffuse severe hypokinesia.  3. Left ventricular ejection fraction 21%  4. Non invasive risk stratification*: High  EKG:  EKG is  ordered today.  The ekg ordered today demonstrates atypical atrial flutter with 2: 1 AV block  Recent Labs: 09/03/2019: B Natriuretic Peptide 667.1 09/04/2019: TSH 1.482 09/06/2019: ALT 80 09/07/2019: Hemoglobin 12.2; Magnesium 1.9; Platelets 197 09/29/2019: BUN 39; Creatinine, Ser 1.35; Potassium 4.8; Sodium 139  Recent Lipid Panel    Component Value Date/Time   CHOL 83 09/04/2019 0223   TRIG 41 09/04/2019 0223   HDL 23 (L) 09/04/2019 0223   CHOLHDL 3.6 09/04/2019 0223   VLDL 8 09/04/2019 0223   LDLCALC 52 09/04/2019 0223    Physical Exam:    VS:  BP (!) 134/91   Pulse 62   Temp (!) 97.5 F (36.4 C)   Ht 6\' 2"  (1.88 m)   Wt 167 lb  12.8 oz (76.1 kg)   SpO2 97%   BMI 21.54 kg/m     Wt Readings from Last 3 Encounters:  10/02/19 167 lb 12.8 oz (76.1 kg)  09/24/19 159 lb 12.8 oz (72.5 kg)  09/07/19 166 lb 12.8 oz (75.7 kg)     GEN: Lean and fit,  well nourished, well developed in no acute distress HEENT: Normal NECK: No JVD; No carotid bruits LYMPHATICS: No lymphadenopathy CARDIAC: Tachycardic, RRR, no murmurs, rubs, gallops RESPIRATORY:  Clear to auscultation without rales, wheezing or rhonchi  ABDOMEN: Soft, non-tender, non-distended MUSCULOSKELETAL:  No edema; No deformity  SKIN: Warm and dry NEUROLOGIC:  Alert and oriented x 3 PSYCHIATRIC:  Normal affect   ASSESSMENT:    1. Atypical atrial flutter (HCC)   2. PAF (paroxysmal atrial fibrillation) (HCC)   3. Long term (current) use of anticoagulants   4. Chronic systolic heart failure (HCC)   5. Stage 3a chronic kidney disease    PLAN:    In order of problems listed above:  1. Aflutter: Likely due to organization of atrial fibrillation reentry circuits on amiodarone.  He is unaware of the arrhythmia, just like he was unaware of atrial fibrillation.  This may be harder to  rate control.  I asked him to increase amiodarone to 400 mg daily and we will bring him back in a couple of weeks.  Unless we can keep his ventricular rate under 100 or so, it is unlikely that her left ventricular systolic function will improve.  If he is still in an atrial arrhythmia with rapid ventricular rate when he returns in 2-3 weeks, he should undergo cardioversion. 2. AFib: Severely dilated left atrium appeared to make it unlikely that he would return to sinus rhythm, but a couple of weeks ago in the office he had normal sinus rhythm.  Discussed the fact that we need a reliable method to assess rhythm/rate control-drug will be encouraged him to consider an implantable loop recorder.  He seems a little uneasy about any implantable device.  I actually think he would be an excellent candidate for the ALLEVIATE-HF trial, since he has both arrhythmia and heart failure. 3. Anticoagulation: Well-tolerated without bleeding complications.  He reports 100% compliance. 4. CHF: Etiology is uncertain, but he appears to have nonischemic cardiomyopathy based on the absence of perfusion abnormalities on Lexiscan view, absence of angina and very limited risk factors for coronary disease.  It is possible he has tachycardia related cardiomyopathy.  Plan to reassess LVEF after at least 3 months of adequate rhythm/rate control.  Stable weight since hospital discharge and clinically euvolemic.  NYHA functional class I-II. 5. CKD 3a: We need to add RAAS inhibitors at his next appointment.  His renal function appears to be stable with a creatinine around 1.35-1.4 (GFR 50-55).   Medication Adjustments/Labs and Tests Ordered: Current medicines are reviewed at length with the patient today.  Concerns regarding medicines are outlined above.  Orders Placed This Encounter  Procedures  . EKG 12-Lead   Meds ordered this encounter  Medications  . amiodarone (PACERONE) 200 MG tablet    Sig: Take 1 tablet (200 mg total) by  mouth daily.    Dispense:  30 tablet    Refill:  11    Patient Instructions  Medication Instructions:  INCREASE the Amiodarone to 400 mg (2 of the 200 mg tablets) for two weeks then go back to 200 mg once daily.  *If you need a refill on your cardiac medications before your  next appointment, please call your pharmacy*  Lab Work: None ordered If you have labs (blood work) drawn today and your tests are completely normal, you will receive your results only by: Marland Kitchen. MyChart Message (if you have MyChart) OR . A paper copy in the mail If you have any lab test that is abnormal or we need to change your treatment, we will call you to review the results.  Testing/Procedures: None ordered  Follow-Up: At [facility], you and your health needs are our priority.  As part of our continuing mission to provide you with exceptional heart care, we have created designated Provider Care Teams.  These Care Teams include your primary Cardiologist (physician) and Advanced Practice Providers (APPs -  Physician Assistants and Nurse Practitioners) who all work together to provide you with the care you need, when you need it.  Your next appointment:   Follow up in 3 weeks with Dr. Royann Shiversroitoru or Judy PimpleKrista Kroeger, PA    Signed, Thurmon FairMihai Arielis Leonhart, MD  10/04/2019 7:48 PM    [facility]

## 2019-10-04 NOTE — H&P (View-Only) (Signed)
Cardiology Office Note:    Date:  10/04/2019   ID:  [name], DOB 1951/01/22, MRN [id number]  PCP:  Patient, No Pcp Per  Cardiologist:  Sanda Klein, MD  Electrophysiologist:  None   Referring MD: No ref. provider found   Chief Complaint  Patient presents with  . Congestive Heart Failure  . Atrial Fibrillation    History of Present Illness:    Michael Noble is a 68 y.o. male with a hx of recent hospitalization (Oct 14-18, 2020) for atrial fibrillation rapid ventricular response and new onset congestive heart failure.  Left ventricular systolic function was severely depressed with EF of 25-30%, but he did not have any evidence of ischemia or scar on nuclear stress testing.  His left atrium was severely dilated but he did not have any significant valvular abnormalities.  Contrast based procedures were avoided due to elevated creatinine (1.4), absence of angina pectoris and virtually no coronary risk factors.  On November 4, surprisingly, he was back in sinus rhythm, after initiation of amiodarone therapy and anticoagulation.  Today he presents in atrial flutter with 2: 1 AV block and is completely unaware of the arrhythmia.  His heart rate is 123 bpm.  He feels well.  The patient specifically denies any chest pain at rest exertion, dyspnea at rest or with exertion, orthopnea, paroxysmal nocturnal dyspnea, syncope, palpitations, focal neurological deficits, intermittent claudication, lower extremity edema, unexplained weight gain, cough, hemoptysis or wheezing.  He has not had falls, injuries or bleeding problems.  Past Medical History:  Diagnosis Date  . Arthritis   . Hepatitis    hx of 35 years ago     Past Surgical History:  Procedure Laterality Date  . TOTAL HIP ARTHROPLASTY Left 06/25/2015   Procedure: LEFT TOTAL HIP ARTHROPLASTY ANTERIOR APPROACH;  Surgeon: Mcarthur Rossetti, MD;  Location: [facility];  Service: Orthopedics;  Laterality: Left;    Current  Medications: Current Meds  Medication Sig  . amiodarone (PACERONE) 200 MG tablet Take 1 tablet (200 mg total) by mouth daily.  Marland Kitchen apixaban (ELIQUIS) 5 MG TABS tablet Take 1 tablet (5 mg total) by mouth 2 (two) times daily.  . carvedilol (COREG) 6.25 MG tablet Take 1 tablet (6.25 mg total) by mouth 2 (two) times daily with a meal.  . furosemide (LASIX) 20 MG tablet Take 1 tablet (20 mg total) by mouth daily.  Marland Kitchen Lysine 500 MG TABS Take 500 mg by mouth daily.  . Misc Natural Products (URINOZINC PO) Take 1 tablet by mouth daily.  . Multiple Vitamin (MULTIVITAMIN WITH MINERALS) TABS tablet Take 1 tablet by mouth daily.  . Multiple Vitamins-Minerals (EQ VISION FORMULA 50+ PO) Take 1 tablet by mouth daily.  . TURMERIC PO Take 1 tablet by mouth daily.  . [DISCONTINUED] amiodarone (PACERONE) 200 MG tablet Take 200 mg twice daily for 7 days until 09/14/2019 and then take 20 mg p.o. once daily.  . [DISCONTINUED] losartan (COZAAR) 25 MG tablet Take 1 tablet (25 mg total) by mouth daily.     Allergies:   Patient has no known allergies.   Social History   Socioeconomic History  . Marital status: Married    Spouse name: Not on file  . Number of children: Not on file  . Years of education: Not on file  . Highest education level: Not on file  Occupational History  . Not on file  Social Needs  . Financial resource strain: Not on file  . Food insecurity  Worry: Not on file    Inability: Not on file  . Transportation needs    Medical: Not on file    Non-medical: Not on file  Tobacco Use  . Smoking status: Former Games developer  . Smokeless tobacco: Former Neurosurgeon    Types: Chew  Substance and Sexual Activity  . Alcohol use: No  . Drug use: No  . Sexual activity: Not on file  Lifestyle  . Physical activity    Days per week: Not on file    Minutes per session: Not on file  . Stress: Not on file  Relationships  . Social Musician on phone: Not on file    Gets together: Not on file     Attends religious service: Not on file    Active member of club or organization: Not on file    Attends meetings of clubs or organizations: Not on file    Relationship status: Not on file  Other Topics Concern  . Not on file  Social History Narrative  . Not on file     Family History: The patient's family history includes Atrial fibrillation in his father; Heart disease in his mother.  ROS:   Please see the history of present illness.     All other systems reviewed and are negative.  EKGs/Labs/Other Studies Reviewed:    The following studies were reviewed today: Echocardiogram 09/04/2019  1. Left ventricular ejection fraction, by visual estimation, is 25 to 30%. The left ventricle has severely decreased function. Mildly increased left ventricular size. There is no left ventricular hypertrophy.  2. Left ventricular diastolic Doppler parameters are indeterminate pattern of LV diastolic filling.  3. Global right ventricle has normal systolic function.The right ventricular size is normal. No increase in right ventricular wall thickness.  4. Left atrial size was severely dilated.  5. Right atrial size was moderately dilated.  6. The mitral valve is normal in structure. Mild mitral valve regurgitation. No evidence of mitral stenosis.  7. The tricuspid valve is normal in structure. Tricuspid valve regurgitation moderate.  8. The aortic valve is normal in structure. Aortic valve regurgitation is mild to moderate by color flow Doppler. Structurally normal aortic valve, with no evidence of sclerosis or stenosis.  9. The pulmonic valve was normal in structure. Pulmonic valve regurgitation is not visualized by color flow Doppler. 10. Mildly elevated pulmonary artery systolic pressure. 11. The inferior vena cava is dilated in size with <50% respiratory variability, suggesting right atrial pressure of 15 mmHg.  Lexiscan Myoview 09/05/2019 IMPRESSION: 1. No reversible ischemia or infarction.   2. Diffuse severe hypokinesia.  3. Left ventricular ejection fraction 21%  4. Non invasive risk stratification*: High  EKG:  EKG is  ordered today.  The ekg ordered today demonstrates atypical atrial flutter with 2: 1 AV block  Recent Labs: 09/03/2019: B Natriuretic Peptide 667.1 09/04/2019: TSH 1.482 09/06/2019: ALT 80 09/07/2019: Hemoglobin 12.2; Magnesium 1.9; Platelets 197 09/29/2019: BUN 39; Creatinine, Ser 1.35; Potassium 4.8; Sodium 139  Recent Lipid Panel    Component Value Date/Time   CHOL 83 09/04/2019 0223   TRIG 41 09/04/2019 0223   HDL 23 (L) 09/04/2019 0223   CHOLHDL 3.6 09/04/2019 0223   VLDL 8 09/04/2019 0223   LDLCALC 52 09/04/2019 0223    Physical Exam:    VS:  BP (!) 134/91   Pulse 62   Temp (!) 97.5 F (36.4 C)   Ht 6\' 2"  (1.88 m)   Wt 167 lb  12.8 oz (76.1 kg)   SpO2 97%   BMI 21.54 kg/m     Wt Readings from Last 3 Encounters:  10/02/19 167 lb 12.8 oz (76.1 kg)  09/24/19 159 lb 12.8 oz (72.5 kg)  09/07/19 166 lb 12.8 oz (75.7 kg)     GEN: Lean and fit,  well nourished, well developed in no acute distress HEENT: Normal NECK: No JVD; No carotid bruits LYMPHATICS: No lymphadenopathy CARDIAC: Tachycardic, RRR, no murmurs, rubs, gallops RESPIRATORY:  Clear to auscultation without rales, wheezing or rhonchi  ABDOMEN: Soft, non-tender, non-distended MUSCULOSKELETAL:  No edema; No deformity  SKIN: Warm and dry NEUROLOGIC:  Alert and oriented x 3 PSYCHIATRIC:  Normal affect   ASSESSMENT:    1. Atypical atrial flutter (HCC)   2. PAF (paroxysmal atrial fibrillation) (HCC)   3. Long term (current) use of anticoagulants   4. Chronic systolic heart failure (HCC)   5. Stage 3a chronic kidney disease    PLAN:    In order of problems listed above:  1. Aflutter: Likely due to organization of atrial fibrillation reentry circuits on amiodarone.  He is unaware of the arrhythmia, just like he was unaware of atrial fibrillation.  This may be harder to  rate control.  I asked him to increase amiodarone to 400 mg daily and we will bring him back in a couple of weeks.  Unless we can keep his ventricular rate under 100 or so, it is unlikely that her left ventricular systolic function will improve.  If he is still in an atrial arrhythmia with rapid ventricular rate when he returns in 2-3 weeks, he should undergo cardioversion. 2. AFib: Severely dilated left atrium appeared to make it unlikely that he would return to sinus rhythm, but a couple of weeks ago in the office he had normal sinus rhythm.  Discussed the fact that we need a reliable method to assess rhythm/rate control-drug will be encouraged him to consider an implantable loop recorder.  He seems a little uneasy about any implantable device.  I actually think he would be an excellent candidate for the ALLEVIATE-HF trial, since he has both arrhythmia and heart failure. 3. Anticoagulation: Well-tolerated without bleeding complications.  He reports 100% compliance. 4. CHF: Etiology is uncertain, but he appears to have nonischemic cardiomyopathy based on the absence of perfusion abnormalities on Lexiscan view, absence of angina and very limited risk factors for coronary disease.  It is possible he has tachycardia related cardiomyopathy.  Plan to reassess LVEF after at least 3 months of adequate rhythm/rate control.  Stable weight since hospital discharge and clinically euvolemic.  NYHA functional class I-II. 5. CKD 3a: We need to add RAAS inhibitors at his next appointment.  His renal function appears to be stable with a creatinine around 1.35-1.4 (GFR 50-55).   Medication Adjustments/Labs and Tests Ordered: Current medicines are reviewed at length with the patient today.  Concerns regarding medicines are outlined above.  Orders Placed This Encounter  Procedures  . EKG 12-Lead   Meds ordered this encounter  Medications  . amiodarone (PACERONE) 200 MG tablet    Sig: Take 1 tablet (200 mg total) by  mouth daily.    Dispense:  30 tablet    Refill:  11    Patient Instructions  Medication Instructions:  INCREASE the Amiodarone to 400 mg (2 of the 200 mg tablets) for two weeks then go back to 200 mg once daily.  *If you need a refill on your cardiac medications before your  next appointment, please call your pharmacy*  Lab Work: None ordered If you have labs (blood work) drawn today and your tests are completely normal, you will receive your results only by: . MyChart Message (if you have MyChart) OR . A paper copy in the mail If you have any lab test that is abnormal or we need to change your treatment, we will call you to review the results.  Testing/Procedures: None ordered  Follow-Up: At [facility], you and your health needs are our priority.  As part of our continuing mission to provide you with exceptional heart care, we have created designated Provider Care Teams.  These Care Teams include your primary Cardiologist (physician) and Advanced Practice Providers (APPs -  Physician Assistants and Nurse Practitioners) who all work together to provide you with the care you need, when you need it.  Your next appointment:   Follow up in 3 weeks with Dr. Luree Noble or Krista Kroeger, PA    Signed, Michael Polendo, MD  10/04/2019 7:48 PM    [facility]  

## 2019-10-06 ENCOUNTER — Telehealth: Payer: Self-pay | Admitting: Cardiovascular Disease

## 2019-10-06 ENCOUNTER — Emergency Department (HOSPITAL_BASED_OUTPATIENT_CLINIC_OR_DEPARTMENT_OTHER): Payer: Medicare Other

## 2019-10-06 ENCOUNTER — Encounter (HOSPITAL_BASED_OUTPATIENT_CLINIC_OR_DEPARTMENT_OTHER): Payer: Self-pay | Admitting: *Deleted

## 2019-10-06 ENCOUNTER — Emergency Department (HOSPITAL_BASED_OUTPATIENT_CLINIC_OR_DEPARTMENT_OTHER)
Admission: EM | Admit: 2019-10-06 | Discharge: 2019-10-06 | Disposition: A | Discharge status: Home / Self Care | Payer: Medicare Other | Attending: Emergency Medicine | Admitting: Emergency Medicine

## 2019-10-06 ENCOUNTER — Other Ambulatory Visit: Payer: Self-pay

## 2019-10-06 DIAGNOSIS — I4891 Unspecified atrial fibrillation: Secondary | ICD-10-CM | POA: Diagnosis not present

## 2019-10-06 DIAGNOSIS — Z7901 Long term (current) use of anticoagulants: Secondary | ICD-10-CM | POA: Insufficient documentation

## 2019-10-06 DIAGNOSIS — Z79899 Other long term (current) drug therapy: Secondary | ICD-10-CM | POA: Diagnosis not present

## 2019-10-06 DIAGNOSIS — I483 Typical atrial flutter: Secondary | ICD-10-CM | POA: Diagnosis not present

## 2019-10-06 DIAGNOSIS — I509 Heart failure, unspecified: Secondary | ICD-10-CM | POA: Diagnosis not present

## 2019-10-06 LAB — CBC WITH DIFFERENTIAL/PLATELET
Abs Immature Granulocytes: 0.02 10*3/uL (ref 0.00–0.07)
Basophils Absolute: 0 10*3/uL (ref 0.0–0.1)
Basophils Relative: 0 %
Eosinophils Absolute: 0 10*3/uL (ref 0.0–0.5)
Eosinophils Relative: 1 %
HCT: 46.1 % (ref 39.0–52.0)
Hemoglobin: 14.4 g/dL (ref 13.0–17.0)
Immature Granulocytes: 0 %
Lymphocytes Relative: 19 %
Lymphs Abs: 1.4 10*3/uL (ref 0.7–4.0)
MCH: 27.4 pg (ref 26.0–34.0)
MCHC: 31.2 g/dL (ref 30.0–36.0)
MCV: 87.6 fL (ref 80.0–100.0)
Monocytes Absolute: 0.6 10*3/uL (ref 0.1–1.0)
Monocytes Relative: 7 %
Neutro Abs: 5.5 10*3/uL (ref 1.7–7.7)
Neutrophils Relative %: 73 %
Platelets: 187 10*3/uL (ref 150–400)
RBC: 5.26 MIL/uL (ref 4.22–5.81)
RDW: 16.3 % — ABNORMAL HIGH (ref 11.5–15.5)
WBC: 7.6 10*3/uL (ref 4.0–10.5)
nRBC: 0 % (ref 0.0–0.2)

## 2019-10-06 LAB — BASIC METABOLIC PANEL
Anion gap: 7 (ref 5–15)
BUN: 39 mg/dL — ABNORMAL HIGH (ref 8–23)
CO2: 29 mmol/L (ref 22–32)
Calcium: 9.5 mg/dL (ref 8.9–10.3)
Chloride: 102 mmol/L (ref 98–111)
Creatinine, Ser: 1.38 mg/dL — ABNORMAL HIGH (ref 0.61–1.24)
GFR calc Af Amer: >60 mL/min (ref >60)
GFR calc non Af Amer: 53 mL/min — ABNORMAL LOW (ref >60)
Glucose, Bld: 109 mg/dL — ABNORMAL HIGH (ref 70–99)
Potassium: 4.3 mmol/L (ref 3.5–5.1)
Sodium: 138 mmol/L (ref 135–145)

## 2019-10-06 LAB — TROPONIN I (HIGH SENSITIVITY): Troponin I (High Sensitivity): 11 ng/L (ref <18)

## 2019-10-06 LAB — MAGNESIUM: Magnesium: 2.2 mg/dL (ref 1.7–2.4)

## 2019-10-06 LAB — BRAIN NATRIURETIC PEPTIDE: B Natriuretic Peptide: 146.1 pg/mL — ABNORMAL HIGH (ref 0.0–100.0)

## 2019-10-06 MED ORDER — CARVEDILOL 6.25 MG PO TABS: 6.2500 mg | ORAL_TABLET | Freq: Two times a day (BID) | ORAL | 11 refills | Status: DC

## 2019-10-06 NOTE — Telephone Encounter (Signed)
Called and spoke to patient and wife- patient states that he has not been feeling well for the past few days, he has not had any energy and wanted to do anything all weekend. His BP has been all over the place- most recent was 139/89 HR 127, and his HR has been jumping around from 60's to 120's. He is not SOB, but does mention when the HR is up he has a heaviness in his chest, arm pain and some neck pain. He denies dizziness or lightheadedness, but states he just does not feel well and does not have a good feeling.  I did advise that with these symptoms he should go to ER to be checked out. Wife agreed.  Will make MD aware.

## 2019-10-06 NOTE — Discharge Instructions (Addendum)
You were seen in the emergency department for a rapid heart rate.  You are in atrial flutter.  You had blood work EKG and a chest x-ray that did not show any serious abnormalities otherwise.  We discussed your case with cardiology and the recommendation was that you follow-up in the clinic with Dr. Sallyanne Kuster for close follow-up.  The office should call you with an appointment.  If you experience any significant worsening of your symptoms please go to [facility] emergency department or the closest hospital.

## 2019-10-06 NOTE — Telephone Encounter (Signed)
Follow Up  Patient went to the ER as instructed and states that while in the ER someone got in contact with Dr. Loletha Grayer and patient was told to call back in to the office to get procedure scheduled where Dr. Loletha Grayer shocks his heart. Please give patient a call to schedule procedure.

## 2019-10-06 NOTE — Telephone Encounter (Signed)
Spoke with the patient. He has agreed to a cardioversion on 10/14/2019. The patient is currently on amiodarone 400 mg for two weeks so the plan is to hopefully be on that dosage a while longer until he can be cardioverted. Scheduling will be called to see if that date will work.  If the patient starts to feel worse or has increasing shortness of breath and feelings that he may pass out, he has been advised to go back to the ED to be cardioverted.

## 2019-10-06 NOTE — ED Triage Notes (Signed)
Taken off Digoxin and was replaced with another heart med.  Patient feels his heart rate is up and down.

## 2019-10-06 NOTE — Telephone Encounter (Signed)
If we can get him scheduled on Tuesday Nov 24 for DCCV (no need for TEE) that would be great

## 2019-10-06 NOTE — Telephone Encounter (Signed)
New message:    Patient wife calling stating that her husband is not feeling well at all. She states that his BP is all over the place and would like for someone to call to let them know if he need to come in or go to ER.

## 2019-10-06 NOTE — ED Provider Notes (Addendum)
[facility] EMERGENCY DEPARTMENT Provider Note   CSN: [id number] Arrival date & time: 10/06/19  9622     History   Chief Complaint Chief Complaint  Patient presents with  . Irregular Heart Rate    HPI Michael Noble is a 68 y.o. male.  He has a history of A. fib and is on anticoagulation.  He said he was recently admitted where they took him off of digoxin and put him on amiodarone carvedilol.  He follows with [facility] Dr. Royann Shivers.  Last saw him about 4 days ago.  He is complaining today of noticing his heart rate going between 60 and 130.  He said when he is going fast he feels a little bit of chest discomfort.  Denies any shortness of breath.  No dizziness lightheadedness syncope diaphoresis.  No illness symptoms of fever chills cough vomiting diarrhea or urinary symptoms.  He said his peripheral edema has greatly improved on his legs.     The history is provided by the patient.  Palpitations Palpitations quality: fast and slow. Onset quality:  Gradual Timing:  Intermittent Progression:  Unchanged Chronicity:  Recurrent Relieved by:  None tried Worsened by:  Nothing Ineffective treatments:  None tried Associated symptoms: chest pain   Associated symptoms: no back pain, no cough, no diaphoresis, no dizziness, no hemoptysis, no lower extremity edema, no malaise/fatigue, no nausea, no shortness of breath, no syncope and no vomiting   Risk factors: hx of atrial fibrillation     Past Medical History:  Diagnosis Date  . Arthritis   . Hepatitis    hx of 35 years ago     Patient Active Problem List   Diagnosis Date Noted  . Acute CHF (congestive heart failure) (HCC) 09/04/2019  . AKI (acute kidney injury) (HCC) 09/04/2019  . Abnormal LFTs 09/04/2019  . Atrial fibrillation with RVR (HCC) 09/03/2019  . Osteoarthritis of left hip 06/25/2015  . Status post total replacement of left hip 06/25/2015    Past Surgical History:  Procedure Laterality Date  . TOTAL HIP  ARTHROPLASTY Left 06/25/2015   Procedure: LEFT TOTAL HIP ARTHROPLASTY ANTERIOR APPROACH;  Surgeon: Kathryne Hitch, MD;  Location: [facility];  Service: Orthopedics;  Laterality: Left;        Home Medications    Prior to Admission medications   Medication Sig Start Date End Date Taking? Authorizing Provider  amiodarone (PACERONE) 200 MG tablet Take 1 tablet (200 mg total) by mouth daily. 10/02/19   Croitoru, Mihai, MD  apixaban (ELIQUIS) 5 MG TABS tablet Take 1 tablet (5 mg total) by mouth 2 (two) times daily. 09/07/19 10/07/19  Hughie Closs, MD  carvedilol (COREG) 6.25 MG tablet Take 1 tablet (6.25 mg total) by mouth 2 (two) times daily with a meal. 09/07/19 10/07/19  Hughie Closs, MD  furosemide (LASIX) 20 MG tablet Take 1 tablet (20 mg total) by mouth daily. 09/24/19   Kroeger, Ovidio Kin., PA-C  Lysine 500 MG TABS Take 500 mg by mouth daily.    [provider]  Misc Natural Products (URINOZINC PO) Take 1 tablet by mouth daily.    [provider]  Multiple Vitamin (MULTIVITAMIN WITH MINERALS) TABS tablet Take 1 tablet by mouth daily.    [provider]  Multiple Vitamins-Minerals (EQ VISION FORMULA 50+ PO) Take 1 tablet by mouth daily.    [provider]  TURMERIC PO Take 1 tablet by mouth daily.    [provider]    Family History Family History  Problem Relation Age of Onset  . Heart disease Mother   . Atrial fibrillation Father   . Aneurysm Father     Social History Social History   Tobacco Use  . Smoking status: Former Games developer  . Smokeless tobacco: Former Neurosurgeon    Types: Chew  Substance Use Topics  . Alcohol use: No  . Drug use: No     Allergies   Patient has no known allergies.   Review of Systems Review of Systems  Constitutional: Negative for diaphoresis, fever and malaise/fatigue.  HENT: Negative for sore throat.   Eyes: Negative for visual disturbance.  Respiratory: Negative for cough, hemoptysis and shortness  of breath.   Cardiovascular: Positive for chest pain and palpitations. Negative for syncope.  Gastrointestinal: Negative for abdominal pain, nausea and vomiting.  Genitourinary: Negative for dysuria.  Musculoskeletal: Negative for back pain.  Skin: Negative for rash.  Neurological: Negative for dizziness.     Physical Exam Updated Vital Signs BP 122/77 (BP Location: Right Arm)   Pulse 61   Temp 97.8 F (36.6 C) (Oral)   Resp 16   Ht 6\' 2"  (1.88 m)   Wt 73.1 kg   SpO2 100%   BMI 20.68 kg/m   Physical Exam Vitals signs and nursing note reviewed.  Constitutional:      Appearance: He is well-developed.  HENT:     Head: Normocephalic and atraumatic.  Eyes:     Conjunctiva/sclera: Conjunctivae normal.  Neck:     Musculoskeletal: Neck supple.  Cardiovascular:     Rate and Rhythm: Tachycardia present. Rhythm irregular.     Pulses: Normal pulses.     Heart sounds: No murmur.  Pulmonary:     Effort: Pulmonary effort is normal. No respiratory distress.     Breath sounds: Normal breath sounds.  Abdominal:     Palpations: Abdomen is soft.     Tenderness: There is no abdominal tenderness.  Musculoskeletal: Normal range of motion.     Right lower leg: No edema.     Left lower leg: No edema.  Skin:    General: Skin is warm and dry.     Capillary Refill: Capillary refill takes less than 2 seconds.  Neurological:     General: No focal deficit present.     Mental Status: He is alert.      ED Treatments / Results  Labs (all labs ordered are listed, but only abnormal results are displayed) Labs Reviewed  BASIC METABOLIC PANEL - Abnormal; Notable for the following components:      Result Value   Glucose, Bld 109 (*)    BUN 39 (*)    Creatinine, Ser 1.38 (*)    GFR calc non Af Amer 53 (*)    All other components within normal limits  CBC WITH DIFFERENTIAL/PLATELET - Abnormal; Notable for the following components:   RDW 16.3 (*)    All other components within normal  limits  BRAIN NATRIURETIC PEPTIDE - Abnormal; Notable for the following components:   B Natriuretic Peptide 146.1 (*)    All other components within normal limits  MAGNESIUM  TROPONIN I (HIGH SENSITIVITY)  TROPONIN I (HIGH SENSITIVITY)    EKG EKG Interpretation  Date/Time:  Monday October 06 2019 09:36:08 EST Ventricular Rate:  125 PR Interval:    QRS Duration: 112 QT Interval:  355 QTC Calculation: 512 R Axis:   -167 Text Interpretation: Probable aflutter Probable left atrial enlargement Borderline intraventricular conduction delay Low voltage, extremity leads Abnormal R-wave  progression, late transition Prolonged QT interval Baseline wander in lead(s) V3 similar to prior 10/20 Confirmed by Aletta Edouard [phone]) on 10/06/2019 9:39:32 AM   Radiology Dg Chest Port 1 View  Result Date: 10/06/2019 CLINICAL DATA:  Abnormal heart rate after change in medication. EXAM: PORTABLE CHEST 1 VIEW COMPARISON:  09/05/2019. FINDINGS: Trachea is midline. Heart size stable. Lungs are clear. No pleural fluid. IMPRESSION: No acute findings. Electronically Signed   By: Lorin Picket M.D.   On: 10/06/2019 10:08    Procedures Procedures (including critical care time)  Medications Ordered in ED Medications - No data to display   Initial Impression / Assessment and Plan / ED Course  I have reviewed the triage vital signs and the nursing notes.  Pertinent labs & imaging results that were available during my care of the patient were reviewed by me and considered in my medical decision making (see chart for details).  Clinical Course as of Oct 05 1921  Mon Oct 06, 2019  1005 Chest x-ray interpreted by me as no pneumothorax no infiltrates.   [MB]  493 68 year old male here with feeling his heart rate going fast and slow.  Known history of A. fib with recent medication changes.  Differential includes breakthrough A. fib, tachybradycardia syndrome, metabolic derangement, pneumonia, ischemia,  pneumothorax.   [MB]  8299 Consulted to [facility] Dr. Marlou Porch.  Appreciate his input.  His recommendations are for the patient to be discharged with close follow-up in the clinic.  He said if the patient is not comfortable with this plan then they can consider admitting him today and cardioverting him tomorrow.  I reviewed this with the patient and he is comfortable going home and understands close follow-up as needed with cardiology and to return to the emergency department if any acute worsening of his symptoms.   [MB]    Clinical Course User Index [MB] Hayden Rasmussen, MD       Final Clinical Impressions(s) / ED Diagnoses   Final diagnoses:  Typical atrial flutter [facility])    ED Discharge Orders    None       Hayden Rasmussen, MD 10/06/19 1813    Hayden Rasmussen, MD 10/06/19 [place]ly [place]

## 2019-10-07 NOTE — Telephone Encounter (Signed)
Patient called and made aware of instructions for the cardioversion and for the covid test.   You are scheduled for a Cardioversion on 10/14/2019 with Dr. Sallyanne Kuster. Please arrive at the [facility] (Main Entrance A) at [facility]: [address] [place], [place] [address] at 1 pm. (1 hour prior to procedure.)  DIET: Nothing to eat or drink after midnight except a sip of water with medications (see medication instructions below)  Medication Instructions: Hold: Nothing to hold  Continue your anticoagulant: Eliquis.  You will need to continue your anticoagulant after your procedure until you are told by your  provider that it is safe to stop   Labs:  You will need to have the coronavirus test completed prior to your procedure. An appointment has been made at 8:15 on 10/14/2019. This is a Drive Up Visit at the [employer] [address]. Someone will direct you to the appropriate testing line. Please tell them that you are there for procedure testing. Stay in your car and someone will be with you shortly. Please make sure to have all other labs completed before this test because you will need to stay quarantined until your procedure.   You must have a responsible person to drive you home and stay in the waiting area during your procedure. Failure to do so could result in cancellation.  Bring your insurance cards.  *Special Note: Every effort is made to have your procedure done on time. Occasionally there are emergencies that occur at the hospital that may cause delays. Please be patient if a delay does occur.

## 2019-10-08 ENCOUNTER — Telehealth: Payer: Self-pay | Admitting: Cardiovascular Disease

## 2019-10-08 MED ORDER — APIXABAN 5 MG PO TABS: 5.0000 mg | ORAL_TABLET | Freq: Two times a day (BID) | ORAL | 3 refills | Status: DC

## 2019-10-08 NOTE — Telephone Encounter (Signed)
Lauren from the [employer] Assistance [employer] called on behalf of the patient's application for Eliquis assistance.    She needed the office to send a new rx to the foundation, because the quantity is incorrect. The current quantity is for bot h 30 days and 90 days, and it needs to be one or the other.  The provider page is currently signed by Cherlynn Polo, so she will need to be the one to submit the new rx. If the new RX will come from Dr. Sallyanne Kuster, the foundation will need a new provider page for the application. A new rx can be faxed to the foundation at [phone]  The foundation also notes that the patient has not submitted any of his individual part of the application yet, and the patient will need to finish his part before the application can be submitted.

## 2019-10-08 NOTE — Addendum Note (Signed)
Addended by: Jacqulynn Cadet on: 10/08/2019 11:28 AM   Modules accepted: Orders

## 2019-10-08 NOTE — Telephone Encounter (Signed)
Called and spoke with Mrs. Michael Noble. I informed her that I was calling to ask about the apllication she helped her husband with for his Eliquis. She stated that she remembers filling it out and faxing it to the number on the application 1-2 days after Mr. Johnsey visit. I asked if she still had the papers on hand. She will refax the patient application to the foundation and give me a call to let me know that she refax the paperwork.

## 2019-10-08 NOTE — Telephone Encounter (Signed)
New Rx was sent to patient's pharmacy. 

## 2019-10-08 NOTE — Telephone Encounter (Signed)
Called and spoke with Mr. Gorter, when asked about the patient portion of his application he stated that his wife Kyrus Hyde filled it out and faxed it back to hte number on the application and that I need to call and speak with her. Mr. Gutzmer gave verbal permission for to speak with his wife Jaymie Misch. Mr. Brimage gave me the number to contact his wife to ask about the application.

## 2019-10-09 ENCOUNTER — Other Ambulatory Visit: Payer: Self-pay

## 2019-10-09 DIAGNOSIS — N1831 Chronic kidney disease, stage 3a: Secondary | ICD-10-CM

## 2019-10-09 LAB — BASIC METABOLIC PANEL
BUN/Creatinine Ratio: 34 — ABNORMAL HIGH (ref 10–24)
BUN: 58 mg/dL — ABNORMAL HIGH (ref 8–27)
CO2: 24 mmol/L (ref 20–29)
Calcium: 9.6 mg/dL (ref 8.6–10.2)
Chloride: 99 mmol/L (ref 96–106)
Creatinine, Ser: 1.73 mg/dL — ABNORMAL HIGH (ref 0.76–1.27)
GFR calc Af Amer: 47 mL/min/{1.73_m2} — ABNORMAL LOW (ref >59)
GFR calc non Af Amer: 40 mL/min/{1.73_m2} — ABNORMAL LOW (ref >59)
Glucose: 81 mg/dL (ref 65–99)
Potassium: 5.3 mmol/L — ABNORMAL HIGH (ref 3.5–5.2)
Sodium: 138 mmol/L (ref 134–144)

## 2019-10-10 ENCOUNTER — Other Ambulatory Visit (HOSPITAL_COMMUNITY)
Admission: RE | Admit: 2019-10-10 | Discharge: 2019-10-10 | Disposition: A | Discharge status: Home / Self Care | Payer: Medicare Other | Source: Ambulatory Visit | Attending: Cardiovascular Disease | Admitting: Cardiovascular Disease

## 2019-10-10 DIAGNOSIS — Z20828 Contact with and (suspected) exposure to other viral communicable diseases: Secondary | ICD-10-CM | POA: Diagnosis not present

## 2019-10-10 DIAGNOSIS — Z01812 Encounter for preprocedural laboratory examination: Secondary | ICD-10-CM | POA: Diagnosis present

## 2019-10-12 LAB — NOVEL CORONAVIRUS, NAA (HOSP ORDER, SEND-OUT TO REF LAB; TAT 18-24 HRS): SARS-CoV-2, NAA: NOT DETECTED

## 2019-10-13 ENCOUNTER — Other Ambulatory Visit: Payer: Self-pay | Admitting: *Deleted

## 2019-10-14 ENCOUNTER — Other Ambulatory Visit: Payer: Self-pay

## 2019-10-14 ENCOUNTER — Encounter (HOSPITAL_COMMUNITY)
Admission: RE | Disposition: A | Discharge status: Home / Self Care | Payer: Self-pay | Source: Home / Self Care | Attending: Cardiovascular Disease

## 2019-10-14 ENCOUNTER — Ambulatory Visit (HOSPITAL_COMMUNITY)
Admission: RE | Admit: 2019-10-14 | Discharge: 2019-10-14 | Disposition: A | Discharge status: Home / Self Care | Payer: Medicare Other | Attending: Cardiovascular Disease | Admitting: Cardiovascular Disease

## 2019-10-14 ENCOUNTER — Encounter (HOSPITAL_COMMUNITY): Payer: Self-pay | Admitting: Emergency Medicine

## 2019-10-14 ENCOUNTER — Telehealth: Payer: Self-pay | Admitting: Cardiovascular Disease

## 2019-10-14 DIAGNOSIS — I5022 Chronic systolic (congestive) heart failure: Secondary | ICD-10-CM | POA: Diagnosis not present

## 2019-10-14 DIAGNOSIS — I4891 Unspecified atrial fibrillation: Secondary | ICD-10-CM

## 2019-10-14 DIAGNOSIS — I480 Paroxysmal atrial fibrillation: Secondary | ICD-10-CM | POA: Insufficient documentation

## 2019-10-14 DIAGNOSIS — I484 Atypical atrial flutter: Secondary | ICD-10-CM | POA: Insufficient documentation

## 2019-10-14 DIAGNOSIS — N1831 Chronic kidney disease, stage 3a: Secondary | ICD-10-CM | POA: Insufficient documentation

## 2019-10-14 DIAGNOSIS — Z79899 Other long term (current) drug therapy: Secondary | ICD-10-CM | POA: Insufficient documentation

## 2019-10-14 DIAGNOSIS — Z8249 Family history of ischemic heart disease and other diseases of the circulatory system: Secondary | ICD-10-CM | POA: Diagnosis not present

## 2019-10-14 DIAGNOSIS — Z87891 Personal history of nicotine dependence: Secondary | ICD-10-CM | POA: Insufficient documentation

## 2019-10-14 DIAGNOSIS — Z538 Procedure and treatment not carried out for other reasons: Secondary | ICD-10-CM | POA: Diagnosis not present

## 2019-10-14 DIAGNOSIS — Z7901 Long term (current) use of anticoagulants: Secondary | ICD-10-CM | POA: Diagnosis not present

## 2019-10-14 DIAGNOSIS — Z96642 Presence of left artificial hip joint: Secondary | ICD-10-CM | POA: Diagnosis not present

## 2019-10-14 LAB — POCT I-STAT, CHEM 8
BUN: 45 mg/dL — ABNORMAL HIGH (ref 8–23)
Calcium, Ion: 1.21 mmol/L (ref 1.15–1.40)
Chloride: 104 mmol/L (ref 98–111)
Creatinine, Ser: 1.7 mg/dL — ABNORMAL HIGH (ref 0.61–1.24)
Glucose, Bld: 97 mg/dL (ref 70–99)
HCT: 43 % (ref 39.0–52.0)
Hemoglobin: 14.6 g/dL (ref 13.0–17.0)
Potassium: 4.8 mmol/L (ref 3.5–5.1)
Sodium: 141 mmol/L (ref 135–145)
TCO2: 28 mmol/L (ref 22–32)

## 2019-10-14 SURGERY — CANCELLED PROCEDURE

## 2019-10-14 MED ORDER — CARVEDILOL 12.5 MG PO TABS: 12.5000 mg | ORAL_TABLET | Freq: Two times a day (BID) | ORAL | 11 refills | Status: DC

## 2019-10-14 NOTE — Progress Notes (Signed)
Presented in atrial fibrillation with controlled ventricular rate (89 bpm), but later converted spontaneously to SR with occasional PVCs.  Will increase carvedilol to 12.5 mg twice daily and reevaluate treatment plan at December 03 visit in the office.  Sanda Klein, MD, [facility] [phone] office [phone] pager

## 2019-10-14 NOTE — Telephone Encounter (Signed)
Patient's wife following up on her husbands patient assistance application.

## 2019-10-14 NOTE — Telephone Encounter (Signed)
[place] to follow up on application that was faxed by patient/wife per previous documentation. They have received the application in its entirety and will review it today. Notified patient of this - advised he may need to pick up short supply of medication from pharmacy while waiting on his application to be reviewed (he has already used free 30 day card). He reports he does not have part D drug coverage for 2020 but will have in 2021.

## 2019-10-14 NOTE — Interval H&P Note (Signed)
History and Physical Interval Note:  10/14/2019 1:56 PM  [name]  has presented today for surgery, with the diagnosis of AFIB.  The various methods of treatment have been discussed with the patient and family. After consideration of risks, benefits and other options for treatment, the patient has consented to  Procedure(s): CANCELLED PROCEDURE as a surgical intervention.  The patient's history has been reviewed, patient examined, no change in status, stable for surgery.  I have reviewed the patient's chart and labs.  Questions were answered to the patient's satisfaction.     Michael Noble

## 2019-10-14 NOTE — Telephone Encounter (Signed)
Patient's wife called [employer]. There is one section on application needing to be completed "is the patient receiving treatment as an outpatient"  Will route to primary nurse to complete this once application is received to office

## 2019-10-14 NOTE — Telephone Encounter (Signed)
Faxed edited [employer] eliquis application in. Denoted that patient is receiving care as outpatient and edited section about quantity per 90 days - updated to #180 per 90 days

## 2019-10-23 ENCOUNTER — Ambulatory Visit (INDEPENDENT_AMBULATORY_CARE_PROVIDER_SITE_OTHER): Payer: Medicare Other | Admitting: Cardiovascular Disease

## 2019-10-23 ENCOUNTER — Encounter: Payer: Self-pay | Admitting: Cardiovascular Disease

## 2019-10-23 ENCOUNTER — Other Ambulatory Visit: Payer: Self-pay

## 2019-10-23 ENCOUNTER — Telehealth: Payer: Self-pay | Admitting: *Deleted

## 2019-10-23 VITALS — BP 148/94 | HR 74 | Temp 97.0°F | Ht 75.0 in | Wt 168.0 lb

## 2019-10-23 DIAGNOSIS — I5022 Chronic systolic (congestive) heart failure: Secondary | ICD-10-CM | POA: Diagnosis not present

## 2019-10-23 DIAGNOSIS — I4819 Other persistent atrial fibrillation: Secondary | ICD-10-CM | POA: Diagnosis not present

## 2019-10-23 DIAGNOSIS — I484 Atypical atrial flutter: Secondary | ICD-10-CM

## 2019-10-23 DIAGNOSIS — Z7901 Long term (current) use of anticoagulants: Secondary | ICD-10-CM

## 2019-10-23 DIAGNOSIS — N1831 Chronic kidney disease, stage 3a: Secondary | ICD-10-CM

## 2019-10-23 MED ORDER — LOSARTAN POTASSIUM 50 MG PO TABS: 50.0000 mg | ORAL_TABLET | Freq: Every day | ORAL | 11 refills | Status: DC

## 2019-10-23 MED ORDER — AMIODARONE HCL 200 MG PO TABS: 200.0000 mg | ORAL_TABLET | Freq: Every day | ORAL | 11 refills | Status: DC

## 2019-10-23 NOTE — Progress Notes (Signed)
Cardiology Office Note:    Date:  10/25/2019   ID:  PHOENIXX Noble, DOB May 27, 1951, MRN [id number]  PCP:  Patient, No Pcp Per  Cardiologist:  Thurmon Fair, MD  Electrophysiologist:  None   Referring MD: No ref. provider found   Chief Complaint  Patient presents with  . Atrial Fibrillation  . Congestive Heart Failure    History of Present Illness:    Michael Noble is a 68 y.o. male with a hx of recent hospitalization (Oct 14-18, 2020) for atrial fibrillation with rapid ventricular response and new onset congestive heart failure.  Left ventricular systolic function was severely depressed with EF of 25-30%, but he did not have any evidence of ischemia or scar on nuclear stress testing.  His left atrium was severely dilated but he did not have any significant valvular abnormalities.  Contrast based procedures were avoided due to elevated creatinine (1.4), absence of angina pectoris and virtually no coronary risk factors.  He has responded well to amiodarone therapy.  We scheduled him for direct-current cardioversion and he presented in atrial fibrillation with controlled ventricular response, but by the time his IV was placed and he was ready for the procedure had converted spontaneously to sinus rhythm.  He has a [employer] watch that has an electrical rhythm monitor.  It has not reported atrial fibrillation since then.  Has kept a detailed log of his blood pressure (consistently in the 120s/60s) and heart rate (60-70s).  The patient specifically denies any chest pain at rest exertion, dyspnea at rest or with exertion, orthopnea, paroxysmal nocturnal dyspnea, syncope, palpitations, focal neurological deficits, intermittent claudication, lower extremity edema, unexplained weight gain, cough, hemoptysis or wheezing.  He is compliant with Eliquis anticoagulation and denies falls, injuries or bleeding complications.  He has been on a 400 mg daily dose of amiodarone for about a month now.   Past Medical History:  Diagnosis Date  . Arthritis   . Hepatitis    hx of 35 years ago     Past Surgical History:  Procedure Laterality Date  . TOTAL HIP ARTHROPLASTY Left 06/25/2015   Procedure: LEFT TOTAL HIP ARTHROPLASTY ANTERIOR APPROACH;  Surgeon: Kathryne Hitch, MD;  Location: [facility];  Service: Orthopedics;  Laterality: Left;    Current Medications: Current Meds  Medication Sig  . amiodarone (PACERONE) 200 MG tablet Take 1 tablet (200 mg total) by mouth daily.  Marland Kitchen apixaban (ELIQUIS) 5 MG TABS tablet Take 1 tablet (5 mg total) by mouth 2 (two) times daily.  . carvedilol (COREG) 12.5 MG tablet Take 1 tablet (12.5 mg total) by mouth 2 (two) times daily with a meal.  . furosemide (LASIX) 20 MG tablet Take 1 tablet (20 mg total) by mouth daily.  Marland Kitchen losartan (COZAAR) 50 MG tablet Take 1 tablet (50 mg total) by mouth daily.  Marland Kitchen Lysine 500 MG TABS Take 500 mg by mouth daily.  . Misc Natural Products (URINOZINC PO) Take 1 tablet by mouth daily.  . Multiple Vitamin (MULTIVITAMIN WITH MINERALS) TABS tablet Take 1 tablet by mouth daily. 50 +  . Multiple Vitamins-Minerals (EQ VISION FORMULA 50+ PO) Take 1 tablet by mouth daily.  . Turmeric 400 MG CAPS Take 400 mg by mouth daily.   . [DISCONTINUED] amiodarone (PACERONE) 200 MG tablet Take 1 tablet (200 mg total) by mouth daily. (Patient taking differently: Take 200 mg by mouth 2 (two) times daily. )  . [DISCONTINUED] losartan (COZAAR) 25 MG tablet Take 25 mg by mouth  daily.     Allergies:   Patient has no known allergies.   Social History   Socioeconomic History  . Marital status: Married    Spouse name: Not on file  . Number of children: Not on file  . Years of education: Not on file  . Highest education level: Not on file  Occupational History  . Not on file  Social Needs  . Financial resource strain: Not on file  . Food insecurity    Worry: Not on file    Inability: Not on file  . Transportation needs    Medical: Not  on file    Non-medical: Not on file  Tobacco Use  . Smoking status: Former Games developermoker  . Smokeless tobacco: Former NeurosurgeonUser    Types: Chew  Substance and Sexual Activity  . Alcohol use: No  . Drug use: No  . Sexual activity: Not on file  Lifestyle  . Physical activity    Days per week: Not on file    Minutes per session: Not on file  . Stress: Not on file  Relationships  . Social Musicianconnections    Talks on phone: Not on file    Gets together: Not on file    Attends religious service: Not on file    Active member of club or organization: Not on file    Attends meetings of clubs or organizations: Not on file    Relationship status: Not on file  Other Topics Concern  . Not on file  Social History Narrative  . Not on file     Family History: The patient's family history includes Aneurysm in his father; Atrial fibrillation in his father; Heart disease in his mother.  ROS:   Please see the history of present illness.    All other systems are reviewed and are negative.  EKGs/Labs/Other Studies Reviewed:    The following studies were reviewed today: Echocardiogram 09/04/2019  1. Left ventricular ejection fraction, by visual estimation, is 25 to 30%. The left ventricle has severely decreased function. Mildly increased left ventricular size. There is no left ventricular hypertrophy.  2. Left ventricular diastolic Doppler parameters are indeterminate pattern of LV diastolic filling.  3. Global right ventricle has normal systolic function.The right ventricular size is normal. No increase in right ventricular wall thickness.  4. Left atrial size was severely dilated.  5. Right atrial size was moderately dilated.  6. The mitral valve is normal in structure. Mild mitral valve regurgitation. No evidence of mitral stenosis.  7. The tricuspid valve is normal in structure. Tricuspid valve regurgitation moderate.  8. The aortic valve is normal in structure. Aortic valve regurgitation is mild to  moderate by color flow Doppler. Structurally normal aortic valve, with no evidence of sclerosis or stenosis.  9. The pulmonic valve was normal in structure. Pulmonic valve regurgitation is not visualized by color flow Doppler. 10. Mildly elevated pulmonary artery systolic pressure. 11. The inferior vena cava is dilated in size with <50% respiratory variability, suggesting right atrial pressure of 15 mmHg.  Lexiscan Myoview 09/05/2019 IMPRESSION: 1. No reversible ischemia or infarction.  2. Diffuse severe hypokinesia.  3. Left ventricular ejection fraction 21%  4. Non invasive risk stratification*: High  EKG:  EKG is  ordered today.  The ekg ordered today demonstrates sinus rhythm with first-degree AV block, rightward axis, QRS 112 ms (nonspecific IVCD), QTC 481 ms.  Recent Labs: 09/04/2019: TSH 1.482 09/06/2019: ALT 80 10/06/2019: B Natriuretic Peptide 146.1; Magnesium 2.2; Platelets 187 10/14/2019:  BUN 45; Creatinine, Ser 1.70; Hemoglobin 14.6; Potassium 4.8; Sodium 141  Recent Lipid Panel    Component Value Date/Time   CHOL 83 09/04/2019 0223   TRIG 41 09/04/2019 0223   HDL 23 (L) 09/04/2019 0223   CHOLHDL 3.6 09/04/2019 0223   VLDL 8 09/04/2019 0223   LDLCALC 52 09/04/2019 0223    Physical Exam:    VS:  BP (!) 148/94   Pulse 74   Temp (!) 97 F (36.1 C)   Ht 6\' 3"  (1.905 m)   Wt 168 lb (76.2 kg)   SpO2 100%   BMI 21.00 kg/m     Wt Readings from Last 3 Encounters:  10/23/19 168 lb (76.2 kg)  10/14/19 160 lb (72.6 kg)  10/06/19 161 lb 1.6 oz (73.1 kg)      General: Alert, oriented x3, no distress, is lean and fit Head: no evidence of trauma, PERRL, EOMI, no exophtalmos or lid lag, no myxedema, no xanthelasma; normal ears, nose and oropharynx Neck: normal jugular venous pulsations and no hepatojugular reflux; brisk carotid pulses without delay and no carotid bruits Chest: clear to auscultation, no signs of consolidation by percussion or palpation, normal  fremitus, symmetrical and full respiratory excursions Cardiovascular: normal position and quality of the apical impulse, regular rhythm, normal first and second heart sounds, no murmurs, rubs or gallops Abdomen: no tenderness or distention, no masses by palpation, no abnormal pulsatility or arterial bruits, normal bowel sounds, no hepatosplenomegaly Extremities: no clubbing, cyanosis or edema; 2+ radial, ulnar and brachial pulses bilaterally; 2+ right femoral, posterior tibial and dorsalis pedis pulses; 2+ left femoral, posterior tibial and dorsalis pedis pulses; no subclavian or femoral bruits Neurological: grossly nonfocal Psych: Normal mood and affect   ASSESSMENT:    1. Atypical atrial flutter (HCC)   2. Persistent atrial fibrillation (HCC)   3. Long term current use of anticoagulant   4. Chronic systolic heart failure (HCC)   5. Stage 3a chronic kidney disease    PLAN:    In order of problems listed above:  1. Aflutter: Maintaining sinus rhythm on amiodarone.  We will reduce the dose to 200 mg daily.  Plan liver and thyroid function tests every 6 months while on this medication. 2. AFib: Organized to atrial flutter after we started amiodarone.  Recurrence is likely since he has a severely dilated left atrium, but hopefully if we controlled the rhythm/rate we will see resolution of his cardiomyopathy (presumably tachycardia related cardiomyopathy). 3. Anticoagulation: No bleeding complications. 4. CHF: Appears clinically euvolemic, excellent functional status.  No longer taking loop diuretics.  Increase losartan to 50 mg daily.  Reassess LVEF in January, follow-up visit after that. 5. CKD 3a: After increasing ARB dose, reassess renal parameters when he comes in for his echocardiogram in January.  Medication Adjustments/Labs and Tests Ordered: Current medicines are reviewed at length with the patient today.  Concerns regarding medicines are outlined above.  Orders Placed This Encounter   Procedures  . EKG 12-Lead   Meds ordered this encounter  Medications  . amiodarone (PACERONE) 200 MG tablet    Sig: Take 1 tablet (200 mg total) by mouth daily.    Dispense:  30 tablet    Refill:  11  . losartan (COZAAR) 50 MG tablet    Sig: Take 1 tablet (50 mg total) by mouth daily.    Dispense:  30 tablet    Refill:  11    Patient Instructions  Medication Instructions:  DECREASE the Amiodarone to  200 mg once daily INCREASE the Losartan to 50 mg once daily  *If you need a refill on your cardiac medications before your next appointment, please call your pharmacy*  Lab Work: None ordered If you have labs (blood work) drawn today and your tests are completely normal, you will receive your results only by: Marland Kitchen MyChart Message (if you have MyChart) OR . A paper copy in the mail If you have any lab test that is abnormal or we need to change your treatment, we will call you to review the results.  Testing/Procedures: None ordered  Follow-Up: Follow up with Dr. Sallyanne Kuster after the [facility] appointment on 12/03/2019     Signed, Sanda Klein, MD  10/25/2019 12:09 PM    [place]

## 2019-10-23 NOTE — Patient Instructions (Signed)
Medication Instructions:  DECREASE the Amiodarone to 200 mg once daily INCREASE the Losartan to 50 mg once daily  *If you need a refill on your cardiac medications before your next appointment, please call your pharmacy*  Lab Work: None ordered If you have labs (blood work) drawn today and your tests are completely normal, you will receive your results only by: Marland Kitchen MyChart Message (if you have MyChart) OR . A paper copy in the mail If you have any lab test that is abnormal or we need to change your treatment, we will call you to review the results.  Testing/Procedures: None ordered  Follow-Up: Follow up with Dr. Sallyanne Kuster after the ECHO appointment on 12/03/2019

## 2019-10-23 NOTE — Telephone Encounter (Signed)
Michael Noble assistance re-faxed due to missing information. Patient has been made aware.

## 2019-10-25 ENCOUNTER — Encounter: Payer: Self-pay | Admitting: Cardiovascular Disease

## 2019-10-27 ENCOUNTER — Other Ambulatory Visit: Payer: Self-pay | Admitting: *Deleted

## 2019-10-27 DIAGNOSIS — I5043 Acute on chronic combined systolic (congestive) and diastolic (congestive) heart failure: Secondary | ICD-10-CM

## 2019-10-27 DIAGNOSIS — I4819 Other persistent atrial fibrillation: Secondary | ICD-10-CM

## 2019-11-12 ENCOUNTER — Ambulatory Visit: Payer: Medicare Other | Admitting: Cardiovascular Disease

## 2019-11-18 MED ORDER — AMIODARONE HCL 200 MG PO TABS: ORAL_TABLET | ORAL | 11 refills | Status: DC

## 2019-11-18 NOTE — Telephone Encounter (Signed)
Med list updated with suggestion to increase amiodarone to 400mg  QD

## 2019-12-03 ENCOUNTER — Other Ambulatory Visit: Payer: Self-pay | Admitting: Cardiovascular Disease

## 2019-12-03 ENCOUNTER — Ambulatory Visit (HOSPITAL_COMMUNITY): Payer: Medicare Other | Attending: Cardiology

## 2019-12-03 ENCOUNTER — Other Ambulatory Visit: Payer: Self-pay

## 2019-12-03 DIAGNOSIS — I5043 Acute on chronic combined systolic (congestive) and diastolic (congestive) heart failure: Secondary | ICD-10-CM | POA: Insufficient documentation

## 2019-12-03 LAB — BASIC METABOLIC PANEL
BUN/Creatinine Ratio: 28 — ABNORMAL HIGH (ref 10–24)
BUN: 37 mg/dL — ABNORMAL HIGH (ref 8–27)
CO2: 25 mmol/L (ref 20–29)
Calcium: 9.6 mg/dL (ref 8.6–10.2)
Chloride: 101 mmol/L (ref 96–106)
Creatinine, Ser: 1.3 mg/dL — ABNORMAL HIGH (ref 0.76–1.27)
GFR calc Af Amer: 66 mL/min/{1.73_m2} (ref >59)
GFR calc non Af Amer: 57 mL/min/{1.73_m2} — ABNORMAL LOW (ref >59)
Glucose: 91 mg/dL (ref 65–99)
Potassium: 5.3 mmol/L — ABNORMAL HIGH (ref 3.5–5.2)
Sodium: 137 mmol/L (ref 134–144)

## 2019-12-04 ENCOUNTER — Other Ambulatory Visit: Payer: Self-pay | Admitting: *Deleted

## 2019-12-04 DIAGNOSIS — E875 Hyperkalemia: Secondary | ICD-10-CM

## 2019-12-05 ENCOUNTER — Telehealth: Payer: Self-pay

## 2019-12-05 ENCOUNTER — Other Ambulatory Visit: Payer: Self-pay | Admitting: *Deleted

## 2019-12-05 DIAGNOSIS — E875 Hyperkalemia: Secondary | ICD-10-CM

## 2019-12-05 MED ORDER — LOSARTAN POTASSIUM 25 MG PO TABS: 25.0000 mg | ORAL_TABLET | Freq: Every day | ORAL | 6 refills | Status: DC

## 2019-12-05 NOTE — Telephone Encounter (Addendum)
Left a voice message for the patient to give me a call back at the office to go over his Echo results.  ----- Message from Beatriz Stallion, PA-C sent at 12/03/2019 11:31 AM EST ----- Please notify the patient that the ultrasound of his heart is shows a continued weakened heart muscle unchanged from October. He can discuss these results further at his appointment with Azalee Course 12/08/19. Thank you!

## 2019-12-08 ENCOUNTER — Other Ambulatory Visit: Payer: Self-pay

## 2019-12-08 ENCOUNTER — Ambulatory Visit (INDEPENDENT_AMBULATORY_CARE_PROVIDER_SITE_OTHER): Payer: Medicare Other | Admitting: Physician Assistant

## 2019-12-08 ENCOUNTER — Encounter: Payer: Self-pay | Admitting: Physician Assistant

## 2019-12-08 VITALS — BP 125/86 | HR 108 | Ht 75.0 in | Wt 166.8 lb

## 2019-12-08 DIAGNOSIS — N183 Chronic kidney disease, stage 3 unspecified: Secondary | ICD-10-CM | POA: Diagnosis not present

## 2019-12-08 DIAGNOSIS — I5022 Chronic systolic (congestive) heart failure: Secondary | ICD-10-CM | POA: Diagnosis not present

## 2019-12-08 DIAGNOSIS — I4892 Unspecified atrial flutter: Secondary | ICD-10-CM | POA: Diagnosis not present

## 2019-12-08 MED ORDER — CARVEDILOL 25 MG PO TABS: 25.0000 mg | ORAL_TABLET | Freq: Two times a day (BID) | ORAL | 0 refills | Status: DC

## 2019-12-08 MED ORDER — AMIODARONE HCL 200 MG PO TABS: 200.0000 mg | ORAL_TABLET | Freq: Two times a day (BID) | ORAL | 0 refills | Status: DC

## 2019-12-08 NOTE — Progress Notes (Signed)
Cardiology Office Note:    Date:  12/10/2019   ID:  Michael Noble, DOB 1951-11-11, MRN [id number]  PCP:  Patient, No Pcp Per  Cardiologist:  Thurmon Fair, MD  Electrophysiologist:  None   Referring MD: No ref. provider found   Chief Complaint  Patient presents with  . Follow-up    seen for Dr. Royann Shivers    History of Present Illness:    Michael Noble is a 69 y.o. adult with a hx of CKD stage III and atrial fibrillation that was diagnosed in October 2020.  He also had new onset of congestive heart failure at the time.  Echocardiogram demonstrated severely decreased EF of 25 to 30%.  Myoview was negative for ischemia or scar.  Cardiomyopathy was felt to be tachycardia mediated.  His left atrium was severely dilated on echocardiogram however he did not have significant valve abnormality.  He was placed on amiodarone therapy.  Initial plan was to proceed with DC cardioversion, however he spontaneously converted to sinus rhythm.  He was last seen by Dr. Royann Shivers on 10/23/2019, amiodarone was decreased to 200 mg daily.  Losartan was increased to 50 mg daily.  Repeat echocardiogram obtained on 12/03/2019 showed EF remains low at 25 to 30%, mild MR, mild AI.  Left atrial size surprisingly normal on this repeat echocardiogram which is questionable.  Looking back, in late December he likely had another episode of atrial flutter.  He presents today for cardiology office visit today.  We have reviewed the recent echocardiogram.  I suspect, his ejection fraction is low due to recurrence of atrial flutter.  He has had at least 2 more episodes of atrial flutter since the last visit.  He is in atrial flutter with heart rate in the 90s to 100s today.  He has been taking higher dose of amiodarone at 200 mg twice daily for the past 2 weeks.  I recommended he continue to take 200 mg twice daily of amiodarone for 1 more week before going down to 200 mg daily after that.  I will increase his carvedilol to 25 mg  twice daily for better rate control.  Once he is maintaining sinus rhythm, we can then consider switching his losartan to [facility] in the future.  I want to hold off on the placement of ICD as I think there is a secondary cause for his persistent cardiomyopathy.  He appears to be euvolemic on physical exam.  His lungs is clear and that he does not have any significant lower extremity edema.  He is no longer on any diuretic at this point.   Past Medical History:  Diagnosis Date  . Arthritis   . Hepatitis    hx of 35 years ago     Past Surgical History:  Procedure Laterality Date  . TOTAL HIP ARTHROPLASTY Left 06/25/2015   Procedure: LEFT TOTAL HIP ARTHROPLASTY ANTERIOR APPROACH;  Surgeon: Kathryne Hitch, MD;  Location: [facility];  Service: Orthopedics;  Laterality: Left;    Current Medications: Current Meds  Medication Sig  . amiodarone (PACERONE) 200 MG tablet Take 1 tablet (200 mg total) by mouth 2 (two) times daily. TAKE 1 TABLET BY MOUTH 2 TIMES A DAY FOR 1 WEEK. THEN 200 MG DAILY  . apixaban (ELIQUIS) 5 MG TABS tablet Take 1 tablet (5 mg total) by mouth 2 (two) times daily.  . carvedilol (COREG) 25 MG tablet Take 1 tablet (25 mg total) by mouth 2 (two) times daily with a meal.  .  losartan (COZAAR) 25 MG tablet Take 1 tablet (25 mg total) by mouth daily.  Marland Kitchen Lysine 500 MG TABS Take 500 mg by mouth daily.  . Misc Natural Products (URINOZINC PO) Take 1 tablet by mouth daily.  . Multiple Vitamin (MULTIVITAMIN WITH MINERALS) TABS tablet Take 1 tablet by mouth daily. 50 +  . Multiple Vitamins-Minerals (EQ VISION FORMULA 50+ PO) Take 1 tablet by mouth daily.  . Turmeric 400 MG CAPS Take 400 mg by mouth daily.   . [DISCONTINUED] amiodarone (PACERONE) 200 MG tablet Take 2 tablets (400mg ) daily for 2 weeks per MD  . [DISCONTINUED] carvedilol (COREG) 12.5 MG tablet Take 1 tablet (12.5 mg total) by mouth 2 (two) times daily with a meal.     Allergies:   Patient has no known allergies.    Social History   Socioeconomic History  . Marital status: Married    Spouse name: Not on file  . Number of children: Not on file  . Years of education: Not on file  . Highest education level: Not on file  Occupational History  . Not on file  Tobacco Use  . Smoking status: Former Research scientist (life sciences)  . Smokeless tobacco: Former Systems developer    Types: Chew  Substance and Sexual Activity  . Alcohol use: No  . Drug use: No  . Sexual activity: Not on file  Other Topics Concern  . Not on file  Social History Narrative  . Not on file   Social Determinants of Health   Financial Resource Strain:   . Difficulty of Paying Living Expenses: Not on file  Food Insecurity:   . Worried About Charity fundraiser in the Last Year: Not on file  . Ran Out of Food in the Last Year: Not on file  Transportation Needs:   . Lack of Transportation (Medical): Not on file  . Lack of Transportation (Non-Medical): Not on file  Physical Activity:   . Days of Exercise per Week: Not on file  . Minutes of Exercise per Session: Not on file  Stress:   . Feeling of Stress : Not on file  Social Connections:   . Frequency of Communication with Friends and Family: Not on file  . Frequency of Social Gatherings with Friends and Family: Not on file  . Attends Religious Services: Not on file  . Active Member of Clubs or Organizations: Not on file  . Attends Archivist Meetings: Not on file  . Marital Status: Not on file     Family History: The patient's family history includes Aneurysm in his father; Atrial fibrillation in his father; Heart disease in his mother.  ROS:   Please see the history of present illness.     All other systems reviewed and are negative.  EKGs/Labs/Other Studies Reviewed:    The following studies were reviewed today:  Echo 12/03/2019 IMPRESSIONS    1. Left ventricular ejection fraction, by visual estimation, is 25 to 30%. The left ventricle has moderate to severely decreased  function. There is no left ventricular hypertrophy.  2. Left ventricular diastolic parameters are indeterminate.  3. The left ventricle demonstrates global hypokinesis.  4. EF calculation based on 3D images appears higher than the visual assessment. Global LV strain appears low normal but does not appear to be tracking accurately and unlikely to be accurate.  5. Global right ventricle has normal systolic function.The right ventricular size is normal. No increase in right ventricular wall thickness.  6. Left atrial size was normal.  7. Right atrial size was normal.  8. The mitral valve is normal in structure. Mild mitral valve regurgitation. No evidence of mitral stenosis.  9. The tricuspid valve is normal in structure. 10. The aortic valve is tricuspid. Aortic valve regurgitation is mild. Mild aortic valve sclerosis without stenosis. 11. The pulmonic valve was normal in structure. Pulmonic valve regurgitation is not visualized. 12. Normal pulmonary artery systolic pressure. 13. The inferior vena cava is normal in size with greater than 50% respiratory variability, suggesting right atrial pressure of 3 mmHg.  EKG:  EKG is ordered today.  The ekg ordered today demonstrates atrial flutter, heart rate 94  Recent Labs: 09/04/2019: TSH 1.482 09/06/2019: ALT 80 10/06/2019: B Natriuretic Peptide 146.1; Magnesium 2.2; Platelets 187 10/14/2019: Hemoglobin 14.6 12/03/2019: BUN 37; Creatinine, Ser 1.30; Potassium 5.3; Sodium 137  Recent Lipid Panel    Component Value Date/Time   CHOL 83 09/04/2019 0223   TRIG 41 09/04/2019 0223   HDL 23 (L) 09/04/2019 0223   CHOLHDL 3.6 09/04/2019 0223   VLDL 8 09/04/2019 0223   LDLCALC 52 09/04/2019 0223    Physical Exam:    VS:  BP 125/86   Pulse (!) 108   Ht 6\' 3"  (1.905 m)   Wt 166 lb 12.8 oz (75.7 kg)   SpO2 98%   BMI 20.85 kg/m     Wt Readings from Last 3 Encounters:  12/08/19 166 lb 12.8 oz (75.7 kg)  10/23/19 168 lb (76.2 kg)  10/14/19 160 lb  (72.6 kg)     GEN:  Well nourished, well developed in no acute distress HEENT: Normal NECK: No JVD; No carotid bruits LYMPHATICS: No lymphadenopathy CARDIAC: Irregularly irregular, no murmurs, rubs, gallops RESPIRATORY:  Clear to auscultation without rales, wheezing or rhonchi  ABDOMEN: Soft, non-tender, non-distended MUSCULOSKELETAL:  No edema; No deformity  SKIN: Warm and dry NEUROLOGIC:  Alert and oriented x 3 PSYCHIATRIC:  Normal affect   ASSESSMENT:    1. Atrial flutter, unspecified type (HCC)   2. Chronic systolic heart failure (HCC)   3. Stage 3 chronic kidney disease, unspecified whether stage 3a or 3b CKD    PLAN:    In order of problems listed above:  1. Atrial flutter: He has had at least 2 episodes of atrial flutter since December.  This is likely the culprit behind his persistent LV dysfunction.  I recommended him to continue on the increased dose of amiodarone 200 mg twice daily for 1 more week before going down to 200 mg daily thereafter.  I have decided to increase his carvedilol to 25 mg twice daily.  He will continue on the Eliquis 5 mg twice daily  2. Chronic systolic heart failure: Euvolemic on physical exam.  Recent echocardiogram showed persistent LV dysfunction.  I suspect this is related to recurrent atrial flutter.  Will need to focus on rhythm control at this point.  Once he is able to maintain sinus rhythm, I plan to switch his losartan to [facility] before repeating an echocardiogram in 2 months.  3. Stage III CKD: Stable on recent lab work   Medication Adjustments/Labs and Tests Ordered: Current medicines are reviewed at length with the patient today.  Concerns regarding medicines are outlined above.  Orders Placed This Encounter  Procedures  . EKG 12-Lead   Meds ordered this encounter  Medications  . carvedilol (COREG) 25 MG tablet    Sig: Take 1 tablet (25 mg total) by mouth 2 (two) times daily with a meal.  Dispense:  60 tablet    Refill:  0   . amiodarone (PACERONE) 200 MG tablet    Sig: Take 1 tablet (200 mg total) by mouth 2 (two) times daily. TAKE 1 TABLET BY MOUTH 2 TIMES A DAY FOR 1 WEEK. THEN 200 MG DAILY    Dispense:  60 tablet    Refill:  0    Patient Instructions  Medication Instructions:   INCREASE CARVEDILOL TO 25 MG 2 TIMES DAILY  TAKE AMIODARONE 200 MG 2 TIMES A DAY FOR 1 WEEK, THEN 200 MG DAILY  *If you need a refill on your cardiac medications before your next appointment, please call your pharmacy*  Lab Work:  NONE ordered at this time of appointment   If you have labs (blood work) drawn today and your tests are completely normal, you will receive your results only by: Marland Kitchen MyChart Message (if you have MyChart) OR . A paper copy in the mail If you have any lab test that is abnormal or we need to change your treatment, we will call you to review the results.  Testing/Procedures:  NONE ordered at this time of appointment   Follow-Up: At [facility], you and your health needs are our priority.  As part of our continuing mission to provide you with exceptional heart care, we have created designated Provider Care Teams.  These Care Teams include your primary Cardiologist (physician) and Advanced Practice Providers (APPs -  Physician Assistants and Nurse Practitioners) who all work together to provide you with the care you need, when you need it.  Your next appointment:   2 week(s)  The format for your next appointment:   In Person  Provider:   Azalee Course, PA-C  Other Instructions      Signed, Azalee Course, PA  12/10/2019 11:16 PM    [facility]

## 2019-12-08 NOTE — Patient Instructions (Addendum)
Medication Instructions:   INCREASE CARVEDILOL TO 25 MG 2 TIMES DAILY  TAKE AMIODARONE 200 MG 2 TIMES A DAY FOR 1 WEEK, THEN 200 MG DAILY  *If you need a refill on your cardiac medications before your next appointment, please call your pharmacy*  Lab Work:  NONE ordered at this time of appointment   If you have labs (blood work) drawn today and your tests are completely normal, you will receive your results only by: Marland Kitchen MyChart Message (if you have MyChart) OR . A paper copy in the mail If you have any lab test that is abnormal or we need to change your treatment, we will call you to review the results.  Testing/Procedures:  NONE ordered at this time of appointment   Follow-Up: At [facility], you and your health needs are our priority.  As part of our continuing mission to provide you with exceptional heart care, we have created designated Provider Care Teams.  These Care Teams include your primary Cardiologist (physician) and Advanced Practice Providers (APPs -  Physician Assistants and Nurse Practitioners) who all work together to provide you with the care you need, when you need it.  Your next appointment:   2 week(s)  The format for your next appointment:   In Person  Provider:   Azalee Course, PA-C  Other Instructions

## 2019-12-10 ENCOUNTER — Encounter: Payer: Self-pay | Admitting: Physician Assistant

## 2019-12-25 ENCOUNTER — Telehealth: Payer: Self-pay | Admitting: *Deleted

## 2019-12-25 LAB — BASIC METABOLIC PANEL
BUN/Creatinine Ratio: 19 (ref 10–24)
BUN: 37 mg/dL — ABNORMAL HIGH (ref 8–27)
CO2: 23 mmol/L (ref 20–29)
Calcium: 9.8 mg/dL (ref 8.6–10.2)
Chloride: 103 mmol/L (ref 96–106)
Creatinine, Ser: 1.94 mg/dL — ABNORMAL HIGH (ref 0.76–1.27)
GFR calc Af Amer: 41 mL/min/{1.73_m2} — ABNORMAL LOW (ref >59)
GFR calc non Af Amer: 35 mL/min/{1.73_m2} — ABNORMAL LOW (ref >59)
Glucose: 71 mg/dL (ref 65–99)
Potassium: 4.7 mmol/L (ref 3.5–5.2)
Sodium: 140 mmol/L (ref 134–144)

## 2019-12-25 MED ORDER — LOSARTAN POTASSIUM 25 MG PO TABS: 12.5000 mg | ORAL_TABLET | Freq: Every day | ORAL | 4 refills | Status: DC

## 2019-12-25 NOTE — Telephone Encounter (Signed)
The patient's wife has been made aware, per the patient's request.   Appointment made for 2/24 with Dr. Royann Shivers.   BMET results Per Dr. Royann Shivers: Surprising increase in creatinine but no change in BUN (both measures of kidney function; lower numbers are better).  Let's cut the losartan in half, to 12.5 mg daily.  Keep appt w Wynema Birch. See me in clinic week of 22 and will repeat BMET then.

## 2019-12-30 ENCOUNTER — Telehealth (INDEPENDENT_AMBULATORY_CARE_PROVIDER_SITE_OTHER): Payer: Medicare Other | Admitting: Physician Assistant

## 2019-12-30 ENCOUNTER — Encounter: Payer: Self-pay | Admitting: Physician Assistant

## 2019-12-30 VITALS — BP 126/80 | HR 63 | Ht 75.0 in | Wt 161.2 lb

## 2019-12-30 DIAGNOSIS — N179 Acute kidney failure, unspecified: Secondary | ICD-10-CM

## 2019-12-30 DIAGNOSIS — I480 Paroxysmal atrial fibrillation: Secondary | ICD-10-CM | POA: Diagnosis not present

## 2019-12-30 DIAGNOSIS — I428 Other cardiomyopathies: Secondary | ICD-10-CM

## 2019-12-30 NOTE — Progress Notes (Signed)
Discussed with the patient today, will keep losartan at 1/2 dose and obtain repeat BMET

## 2019-12-30 NOTE — Progress Notes (Signed)
Virtual Visit via Telephone Note   This visit type was conducted due to national recommendations for restrictions regarding the COVID-19 Pandemic (e.g. social distancing) in an effort to limit this patient's exposure and mitigate transmission in our community.  Due to his co-morbid illnesses, this patient is at least at moderate risk for complications without adequate follow up.  This format is felt to be most appropriate for this patient at this time.  The patient did not have access to video technology/had technical difficulties with video requiring transitioning to audio format only (telephone).  All issues noted in this document were discussed and addressed.  No physical exam could be performed with this format.  Please refer to the patient's chart for his  consent to telehealth for [facility].   Date:  12/30/2019   ID:  Michael Noble, DOB 1951/01/23, MRN [id number]  Patient Location: Home Provider Location: Office  PCP:  Patient, No Pcp Per  Cardiologist:  Thurmon Fair, MD  Electrophysiologist:  None   Evaluation Performed:  Follow-Up Visit  Chief Complaint:  followup  History of Present Illness:    Michael Noble is a 69 y.o. adult with hx of CKD stage III and atrial fibrillation that was diagnosed in October 2020.  He also had new onset of congestive heart failure at the time.  Echocardiogram demonstrated severely decreased EF of 25 to 30%.  Myoview was negative for ischemia or scar.  Cardiomyopathy was felt to be tachycardia mediated.  His left atrium was severely dilated on echocardiogram however he did not have significant valve abnormality.  He was placed on amiodarone therapy.  Initial plan was to proceed with DC cardioversion, however he spontaneously converted to sinus rhythm.  He was last seen by Dr. Royann Shivers on 10/23/2019, amiodarone was decreased to 200 mg daily.  Losartan was increased to 50 mg daily.  Repeat echocardiogram obtained on 12/03/2019 showed EF remains low  at 25 to 30%, mild MR, mild AI.  Left atrial size was surprisingly normal on this repeat echocardiogram which is questionable.  I last saw the patient on 12/08/2019, he has had at least 2 more episodes of atrial fibrillation.  During the office visit, he was in atrial flutter with heart rate in the 90s to 100 range.  He was placed on higher dose of amiodarone 200 mg twice daily.  I recommended him to continue on higher dose of amiodarone for 1 more week before going down to 200 mg daily thereafter.  I also increase his carvedilol to 25 mg twice daily for better rate control.  Based on his Samsung smart watch, he has since converted back to sinus rhythm.  Due to worsening renal function, his losartan was cut back to half a tablet per day.  He is due for basic metabolic panel prior to his next visit with Dr. Royann Shivers in 2 weeks.  Overall he is doing well after converting back to sinus rhythm.  He denies any chest pain or shortness of breath.  He is currently taking once daily dosing of amiodarone 200 mg.  I will defer to Dr. Royann Shivers to decide on when to obtain repeat echocardiogram. He will need a repeat BMET.  The patient does not have symptoms concerning for COVID-19 infection (fever, chills, cough, or new shortness of breath).    Past Medical History:  Diagnosis Date  . Arthritis   . Hepatitis    hx of 35 years ago    Past Surgical History:  Procedure Laterality  Date  . TOTAL HIP ARTHROPLASTY Left 06/25/2015   Procedure: LEFT TOTAL HIP ARTHROPLASTY ANTERIOR APPROACH;  Surgeon: Kathryne Hitch, MD;  Location: [facility];  Service: Orthopedics;  Laterality: Left;     Current Meds  Medication Sig  . amiodarone (PACERONE) 200 MG tablet Take 1 tablet (200 mg total) by mouth 2 (two) times daily. TAKE 1 TABLET BY MOUTH 2 TIMES A DAY FOR 1 WEEK. THEN 200 MG DAILY  . apixaban (ELIQUIS) 5 MG TABS tablet Take 1 tablet (5 mg total) by mouth 2 (two) times daily.  . carvedilol (COREG) 25 MG tablet Take 1  tablet (25 mg total) by mouth 2 (two) times daily with a meal.  . losartan (COZAAR) 25 MG tablet Take 0.5 tablets (12.5 mg total) by mouth daily.  Marland Kitchen Lysine 500 MG TABS Take 500 mg by mouth daily.  . Misc Natural Products (URINOZINC PO) Take 1 tablet by mouth daily.  . Multiple Vitamin (MULTIVITAMIN WITH MINERALS) TABS tablet Take 1 tablet by mouth daily. 50 +  . Multiple Vitamins-Minerals (EQ VISION FORMULA 50+ PO) Take 1 tablet by mouth daily.  . Turmeric 400 MG CAPS Take 400 mg by mouth daily.      Allergies:   Patient has no known allergies.   Social History   Tobacco Use  . Smoking status: Former Games developer  . Smokeless tobacco: Former Neurosurgeon    Types: Chew  Substance Use Topics  . Alcohol use: No  . Drug use: No     Family Hx: The patient's family history includes Aneurysm in his father; Atrial fibrillation in his father; Heart disease in his mother.  ROS:   Please see the history of present illness.     All other systems reviewed and are negative.   Prior CV studies:   The following studies were reviewed today:  Echo 12/03/2019 IMPRESSIONS  1. Left ventricular ejection fraction, by visual estimation, is 25 to  30%. The left ventricle has moderate to severely decreased function. There  is no left ventricular hypertrophy.  2. Left ventricular diastolic parameters are indeterminate.  3. The left ventricle demonstrates global hypokinesis.  4. EF calculation based on 3D images appears higher than the visual  assessment. Global LV strain appears low normal but does not appear to be  tracking accurately and unlikely to be accurate.  5. Global right ventricle has normal systolic function.The right  ventricular size is normal. No increase in right ventricular wall  thickness.  6. Left atrial size was normal.  7. Right atrial size was normal.  8. The mitral valve is normal in structure. Mild mitral valve  regurgitation. No evidence of mitral stenosis.  9. The  tricuspid valve is normal in structure.  10. The aortic valve is tricuspid. Aortic valve regurgitation is mild.  Mild aortic valve sclerosis without stenosis.  11. The pulmonic valve was normal in structure. Pulmonic valve  regurgitation is not visualized.  12. Normal pulmonary artery systolic pressure.  13. The inferior vena cava is normal in size with greater than 50%  respiratory variability, suggesting right atrial pressure of 3 mmHg.   Labs/Other Tests and Data Reviewed:    EKG:  The patient's Apple Watch cardiac telemetry strip(s) personally reviewed today demonstrate:  Normal sinus rhythm  Recent Labs: 09/04/2019: TSH 1.482 09/06/2019: ALT 80 10/06/2019: B Natriuretic Peptide 146.1; Magnesium 2.2; Platelets 187 10/14/2019: Hemoglobin 14.6 12/24/2019: BUN 37; Creatinine, Ser 1.94; Potassium 4.7; Sodium 140   Recent Lipid Panel Lab Results  Component Value Date/Time   CHOL 83 09/04/2019 02:23 AM   TRIG 41 09/04/2019 02:23 AM   HDL 23 (L) 09/04/2019 02:23 AM   CHOLHDL 3.6 09/04/2019 02:23 AM   LDLCALC 52 09/04/2019 02:23 AM    Wt Readings from Last 3 Encounters:  12/30/19 161 lb 3.2 oz (73.1 kg)  12/08/19 166 lb 12.8 oz (75.7 kg)  10/23/19 168 lb (76.2 kg)     Objective:    Vital Signs:  BP 126/80   Pulse 63   Ht 6\' 3"  (1.905 m)   Wt 161 lb 3.2 oz (73.1 kg)   SpO2 98%   BMI 20.15 kg/m    VITAL SIGNS:  reviewed  ASSESSMENT & PLAN:    1. Paroxysmal atrial fibrillation: Maintaining sinus rhythm after up titration of carvedilol.  Continue on low-dose amiodarone.  He is currently on 200 mg daily amiodarone.  He has been compliant with Eliquis.  2. Nonischemic cardiomyopathy: EF remains low based on the recent echocardiogram.  Although previous echocardiogram demonstrated severely enlarged left atrium, however recent echocardiogram suggested normal left atrial size which is questionable.  If he is able to remain in sinus rhythm for 2 more month, I would recommend a  repeat echocardiogram.  3. Acute on chronic renal insufficiency: We will repeat a basic metabolic panel is recent creatinine trended up to 1.94.  His losartan was cut back to half a tablet  COVID-19 Education: The signs and symptoms of COVID-19 were discussed with the patient and how to seek care for testing (follow up with PCP or arrange E-visit). The importance of social distancing was discussed today.  Time:   Today, I have spent 12 minutes with the patient with telehealth technology discussing the above problems.     Medication Adjustments/Labs and Tests Ordered: Current medicines are reviewed at length with the patient today.  Concerns regarding medicines are outlined above.   Tests Ordered: No orders of the defined types were placed in this encounter.   Medication Changes: No orders of the defined types were placed in this encounter.   Follow Up:  Either In Person or Virtual in 2 week(s)  Signed, Almyra Deforest, [place]  12/30/2019 8:27 AM    [place]

## 2019-12-30 NOTE — Patient Instructions (Addendum)
Medication Instructions:  Your physician recommends that you continue on your current medications as directed. Please refer to the Current Medication list given to you today.  *If you need a refill on your cardiac medications before your next appointment, please call your pharmacy*  Lab Work: You will need to have labs (blood work) drawn before your next visit with Dr. Royann Shivers:  BMET If you have labs (blood work) drawn today and your tests are completely normal, you will receive your results only by: Marland Kitchen MyChart Message (if you have MyChart) OR . A paper copy in the mail If you have any lab test that is abnormal or we need to change your treatment, we will call you to review the results.  Testing/Procedures: NONE ordered at this time of appointment   Follow-Up: At [facility], you and your health needs are our priority.  As part of our continuing mission to provide you with exceptional heart care, we have created designated Provider Care Teams.  These Care Teams include your primary Cardiologist (physician) and Advanced Practice Providers (APPs -  Physician Assistants and Nurse Practitioners) who all work together to provide you with the care you need, when you need it.  Your next appointment:   As SCHEDULED-Jan 14, 2020 at 4:00PM  The format for your next appointment:   In Person  Provider:   Thurmon Fair, MD  Other Instructions

## 2020-01-06 LAB — BASIC METABOLIC PANEL
BUN/Creatinine Ratio: 24 (ref 10–24)
BUN: 35 mg/dL — ABNORMAL HIGH (ref 8–27)
CO2: 26 mmol/L (ref 20–29)
Calcium: 9.2 mg/dL (ref 8.6–10.2)
Chloride: 101 mmol/L (ref 96–106)
Creatinine, Ser: 1.45 mg/dL — ABNORMAL HIGH (ref 0.76–1.27)
GFR calc Af Amer: 58 mL/min/{1.73_m2} — ABNORMAL LOW (ref >59)
GFR calc non Af Amer: 50 mL/min/{1.73_m2} — ABNORMAL LOW (ref >59)
Glucose: 85 mg/dL (ref 65–99)
Potassium: 4.7 mmol/L (ref 3.5–5.2)
Sodium: 140 mmol/L (ref 134–144)

## 2020-01-14 ENCOUNTER — Other Ambulatory Visit: Payer: Self-pay

## 2020-01-14 ENCOUNTER — Encounter: Payer: Self-pay | Admitting: Cardiovascular Disease

## 2020-01-14 ENCOUNTER — Ambulatory Visit (INDEPENDENT_AMBULATORY_CARE_PROVIDER_SITE_OTHER): Payer: Medicare Other | Admitting: Cardiovascular Disease

## 2020-01-14 VITALS — BP 120/78 | HR 59 | Temp 96.7°F | Ht 75.0 in | Wt 166.8 lb

## 2020-01-14 DIAGNOSIS — Z7901 Long term (current) use of anticoagulants: Secondary | ICD-10-CM | POA: Diagnosis not present

## 2020-01-14 DIAGNOSIS — I5022 Chronic systolic (congestive) heart failure: Secondary | ICD-10-CM

## 2020-01-14 DIAGNOSIS — I4819 Other persistent atrial fibrillation: Secondary | ICD-10-CM

## 2020-01-14 DIAGNOSIS — Z5181 Encounter for therapeutic drug level monitoring: Secondary | ICD-10-CM | POA: Diagnosis not present

## 2020-01-14 DIAGNOSIS — Z79899 Other long term (current) drug therapy: Secondary | ICD-10-CM

## 2020-01-14 DIAGNOSIS — N1831 Chronic kidney disease, stage 3a: Secondary | ICD-10-CM

## 2020-01-14 MED ORDER — APIXABAN 5 MG PO TABS: 5.0000 mg | ORAL_TABLET | Freq: Two times a day (BID) | ORAL | 3 refills | Status: DC

## 2020-01-14 MED ORDER — CARVEDILOL 25 MG PO TABS: 25.0000 mg | ORAL_TABLET | Freq: Two times a day (BID) | ORAL | 3 refills | Status: DC

## 2020-01-14 NOTE — Patient Instructions (Signed)
Medication Instructions:  No changes *If you need a refill on your cardiac medications before your next appointment, please call your pharmacy*  Lab Work: Your provider would like for you to return in 3 months on the same day as your echo to have the following labs drawn: CMET and TSH. You do not need an appointment for the lab. Once in our office lobby there is a podium where you can sign in and ring the doorbell to alert [place] that you are here. The lab is open from 8:00 am to 4:30 pm; closed for lunch from 12:45pm-1:45pm.  If you have labs (blood work) drawn today and your tests are completely normal, you will receive your results only by: Marland Kitchen MyChart Message (if you have MyChart) OR . A paper copy in the mail If you have any lab test that is abnormal or we need to change your treatment, we will call you to review the results.  Testing/Procedures: Your physician has requested that you have an echocardiogram in 3 months. Echocardiography is a painless test that uses sound waves to create images of your heart. It provides your doctor with information about the size and shape of your heart and how well your heart's chambers and valves are working. You may receive an ultrasound enhancing agent through an IV if needed to better visualize your heart during the echo.This procedure takes approximately one hour. There are no restrictions for this procedure. This will take place at the [address], [address].    Follow-Up: At [facility], you and your health needs are our priority.  As part of our continuing mission to provide you with exceptional heart care, we have created designated Provider Care Teams.  These Care Teams include your primary Cardiologist (physician) and Advanced Practice Providers (APPs -  Physician Assistants and Nurse Practitioners) who all work together to provide you with the care you need, when you need it.  Your next appointment:   3 month(s)  The format for your next  appointment:   In Person  Provider:   Thurmon Fair, MD

## 2020-01-14 NOTE — Progress Notes (Signed)
Cardiology Office Note:    Date:  01/15/2020   ID:  Michael Noble, DOB 06/09/1951, MRN [id number]  PCP:  Patient, No Pcp Per  Cardiologist:  Thurmon Fair, MD  Electrophysiologist:  None   Referring MD: No ref. provider found   Chief Complaint  Patient presents with  . Congestive Heart Failure  . Atrial Fibrillation    History of Present Illness:    Michael Noble is a 69 y.o. adult with a hx of recent hospitalization (Oct 14-18, 2020) for atrial fibrillation with rapid ventricular response and new onset congestive heart failure.  Left ventricular systolic function was severely depressed with EF of 25-30%, but he did not have any evidence of ischemia or scar on nuclear stress testing.  His left atrium was severely dilated but he did not have any significant valvular abnormalities.  Contrast based procedures were avoided due to elevated creatinine (1.4), absence of angina pectoris and virtually no coronary risk factors.  He has been on a maximum dose of carvedilol, but has only tolerated a very low-dose of losartan due to worsening renal function.  A follow-up echocardiogram on December 03, 2019 still shows an ejection fraction of 25-30%, but he was still having spells of atrial fibrillation until recently.  He keeps a daily log of his heart rate and blood pressure and over the last month his heart rate has consistently been in the low to mid 60s (when in A. fib in mid January his heart rate would be in the 90s).  He also uses a  [employer] watch that has an electrical rhythm monitor to track his heart rhythm and periodically will send me a tracing, consistently in normal sinus rhythm for over a month now.  The patient specifically denies any chest pain at rest exertion, dyspnea at rest or with exertion, orthopnea, paroxysmal nocturnal dyspnea, syncope, palpitations, focal neurological deficits, intermittent claudication, lower extremity edema, unexplained weight gain, cough, hemoptysis  or wheezing.  He is now on the 200 mg daily dose of amiodarone.  He is compliant with Eliquis and has not had falls, injuries or bleeding problems.  His most recent creatinine on February 15 was 1.45 and his potassium was 4.7.  His lipid profile is excellent.  He does not have diabetes mellitus.  He has never had a stroke/TIA or embolic events.  Past Medical History:  Diagnosis Date  . Arthritis   . Hepatitis    hx of 35 years ago     Past Surgical History:  Procedure Laterality Date  . TOTAL HIP ARTHROPLASTY Left 06/25/2015   Procedure: LEFT TOTAL HIP ARTHROPLASTY ANTERIOR APPROACH;  Surgeon: Kathryne Hitch, MD;  Location: [facility];  Service: Orthopedics;  Laterality: Left;    Current Medications: Current Meds  Medication Sig  . apixaban (ELIQUIS) 5 MG TABS tablet Take 1 tablet (5 mg total) by mouth 2 (two) times daily.  Marland Kitchen losartan (COZAAR) 25 MG tablet Take 0.5 tablets (12.5 mg total) by mouth daily.  Marland Kitchen Lysine 500 MG TABS Take 500 mg by mouth daily.  . Misc Natural Products (URINOZINC PO) Take 1 tablet by mouth daily.  . Multiple Vitamin (MULTIVITAMIN WITH MINERALS) TABS tablet Take 1 tablet by mouth daily. 50 +  . Multiple Vitamins-Minerals (EQ VISION FORMULA 50+ PO) Take 1 tablet by mouth daily.  . Turmeric 400 MG CAPS Take 400 mg by mouth daily.   . [DISCONTINUED] apixaban (ELIQUIS) 5 MG TABS tablet Take 1 tablet (5 mg total) by mouth  2 (two) times daily.     Allergies:   Patient has no known allergies.   Social History   Socioeconomic History  . Marital status: Married    Spouse name: Not on file  . Number of children: Not on file  . Years of education: Not on file  . Highest education level: Not on file  Occupational History  . Not on file  Tobacco Use  . Smoking status: Former Games developer  . Smokeless tobacco: Former Neurosurgeon    Types: Chew  Substance and Sexual Activity  . Alcohol use: No  . Drug use: No  . Sexual activity: Not on file  Other Topics Concern  .  Not on file  Social History Narrative  . Not on file   Social Determinants of Health   Financial Resource Strain:   . Difficulty of Paying Living Expenses: Not on file  Food Insecurity:   . Worried About Programme researcher, broadcasting/film/video in the Last Year: Not on file  . Ran Out of Food in the Last Year: Not on file  Transportation Needs:   . Lack of Transportation (Medical): Not on file  . Lack of Transportation (Non-Medical): Not on file  Physical Activity:   . Days of Exercise per Week: Not on file  . Minutes of Exercise per Session: Not on file  Stress:   . Feeling of Stress : Not on file  Social Connections:   . Frequency of Communication with Friends and Family: Not on file  . Frequency of Social Gatherings with Friends and Family: Not on file  . Attends Religious Services: Not on file  . Active Member of Clubs or Organizations: Not on file  . Attends Banker Meetings: Not on file  . Marital Status: Not on file     Family History: The patient's family history includes Aneurysm in his father; Atrial fibrillation in his father; Heart disease in his mother.  ROS:   Please see the history of present illness.    All other systems are reviewed and are negative.  EKGs/Labs/Other Studies Reviewed:    The following studies were reviewed today: Echocardiogram 09/04/2019  1. Left ventricular ejection fraction, by visual estimation, is 25 to 30%. The left ventricle has severely decreased function. Mildly increased left ventricular size. There is no left ventricular hypertrophy.  2. Left ventricular diastolic Doppler parameters are indeterminate pattern of LV diastolic filling.  3. Global right ventricle has normal systolic function.The right ventricular size is normal. No increase in right ventricular wall thickness.  4. Left atrial size was severely dilated.  5. Right atrial size was moderately dilated.  6. The mitral valve is normal in structure. Mild mitral valve regurgitation.  No evidence of mitral stenosis.  7. The tricuspid valve is normal in structure. Tricuspid valve regurgitation moderate.  8. The aortic valve is normal in structure. Aortic valve regurgitation is mild to moderate by color flow Doppler. Structurally normal aortic valve, with no evidence of sclerosis or stenosis.  9. The pulmonic valve was normal in structure. Pulmonic valve regurgitation is not visualized by color flow Doppler. 10. Mildly elevated pulmonary artery systolic pressure. 11. The inferior vena cava is dilated in size with <50% respiratory variability, suggesting right atrial pressure of 15 mmHg.  Lexiscan Myoview 09/05/2019 IMPRESSION: 1. No reversible ischemia or infarction.  2. Diffuse severe hypokinesia.  3. Left ventricular ejection fraction 21%  4. Non invasive risk stratification*: High  12/03/2019 echocardiogram 1. Left ventricular ejection fraction, by visual  estimation, is 25 to  30%. The left ventricle has moderate to severely decreased function. There  is no left ventricular hypertrophy.  2. Left ventricular diastolic parameters are indeterminate.  3. The left ventricle demonstrates global hypokinesis.  4. EF calculation based on 3D images appears higher than the visual  assessment. Global LV strain appears low normal but does not appear to be  tracking accurately and unlikely to be accurate.  5. Global right ventricle has normal systolic function.The right  ventricular size is normal. No increase in right ventricular wall  thickness.  6. Left atrial size was normal.  7. Right atrial size was normal.  8. The mitral valve is normal in structure. Mild mitral valve  regurgitation. No evidence of mitral stenosis.  9. The tricuspid valve is normal in structure.  10. The aortic valve is tricuspid. Aortic valve regurgitation is mild.  Mild aortic valve sclerosis without stenosis.  11. The pulmonic valve was normal in structure. Pulmonic valve    regurgitation is not visualized.  12. Normal pulmonary artery systolic pressure.  13. The inferior vena cava is normal in size with greater than 50%  respiratory variability, suggesting right atrial pressure of 3 mmHg.   EKG:  EKG is  ordered today.  The ekg ordered today demonstrates sinus rhythm with first-degree AV block, rightward axis, QRS 112 ms (nonspecific IVCD), QTC 481 ms.  Recent Labs: 09/04/2019: TSH 1.482 09/06/2019: ALT 80 10/06/2019: B Natriuretic Peptide 146.1; Magnesium 2.2; Platelets 187 10/14/2019: Hemoglobin 14.6 01/05/2020: BUN 35; Creatinine, Ser 1.45; Potassium 4.7; Sodium 140  Recent Lipid Panel    Component Value Date/Time   CHOL 83 09/04/2019 0223   TRIG 41 09/04/2019 0223   HDL 23 (L) 09/04/2019 0223   CHOLHDL 3.6 09/04/2019 0223   VLDL 8 09/04/2019 0223   LDLCALC 52 09/04/2019 0223    Physical Exam:    VS:  BP 120/78   Pulse (!) 59   Temp (!) 96.7 F (35.9 C)   Ht 6\' 3"  (1.905 m)   Wt 166 lb 12.8 oz (75.7 kg)   SpO2 99%   BMI 20.85 kg/m     Wt Readings from Last 3 Encounters:  01/14/20 166 lb 12.8 oz (75.7 kg)  12/30/19 161 lb 3.2 oz (73.1 kg)  12/08/19 166 lb 12.8 oz (75.7 kg)     General: Alert, oriented x3, no distress, lean, appears fit Head: no evidence of trauma, PERRL, EOMI, no exophtalmos or lid lag, no myxedema, no xanthelasma; normal ears, nose and oropharynx Neck: normal jugular venous pulsations and no hepatojugular reflux; brisk carotid pulses without delay and no carotid bruits Chest: clear to auscultation, no signs of consolidation by percussion or palpation, normal fremitus, symmetrical and full respiratory excursions Cardiovascular: normal position and quality of the apical impulse, regular rhythm, normal first and second heart sounds, no murmurs, rubs or gallops Abdomen: no tenderness or distention, no masses by palpation, no abnormal pulsatility or arterial bruits, normal bowel sounds, no hepatosplenomegaly Extremities:  no clubbing, cyanosis or edema; 2+ radial, ulnar and brachial pulses bilaterally; 2+ right femoral, posterior tibial and dorsalis pedis pulses; 2+ left femoral, posterior tibial and dorsalis pedis pulses; no subclavian or femoral bruits Neurological: grossly nonfocal Psych: Normal mood and affect   ASSESSMENT:    1. Persistent atrial fibrillation (HCC)   2. Encounter for monitoring amiodarone therapy   3. Long term (current) use of anticoagulants   4. Chronic systolic heart failure (HCC)   5. Stage 3a chronic kidney  disease    PLAN:    In order of problems listed above:  1. AFib: Organized to atrial flutter after we started amiodarone.  Was still having episodes of paroxysmal atrial fib in early to mid January, but seems to be inconsistent normal rhythm for the last month at least.  Recurrence is likely since he has a severely dilated left atrium, but hopefully if we controlled the rhythm/rate we will see resolution of his cardiomyopathy (presumably tachycardia related cardiomyopathy).   2. Anticoagulation: Tolerated well without bleeding problems. 3. Amiodarone: Hopefully this will not be a lifelong medication for him.  Need to check liver function tests, TSH every 6 months, yearly eye exams, avoid excessive sunlight exposure, promptly report otherwise unexplained respiratory symptoms. 4. CHF: Clinically euvolemic and NYHA functional class I without the need for loop diuretics.  Did not tolerate higher doses of losartan and is unlikely to tolerate Entresto. We will repeat an echocardiogram in April before his appointment.  If his LVEF remains 35% we will have to discuss implantable defibrillator therapy.  We reviewed the purpose for  this briefly today. 5. CKD 3a: Did not tolerate higher doses of ARB (creatinine abruptly increased from 1.30-1.94, borderline high potassium levels at 5.3).  Unlikely to tolerate Entresto or spironolactone.  Medication Adjustments/Labs and Tests Ordered: Current  medicines are reviewed at length with the patient today.  Concerns regarding medicines are outlined above.  Orders Placed This Encounter  Procedures  . Comprehensive metabolic panel  . TSH  . EKG 12-Lead  . ECHOCARDIOGRAM COMPLETE   Meds ordered this encounter  Medications  . carvedilol (COREG) 25 MG tablet    Sig: Take 1 tablet (25 mg total) by mouth 2 (two) times daily with a meal.    Dispense:  180 tablet    Refill:  3  . apixaban (ELIQUIS) 5 MG TABS tablet    Sig: Take 1 tablet (5 mg total) by mouth 2 (two) times daily.    Dispense:  180 tablet    Refill:  3    Patient Instructions  Medication Instructions:  No changes *If you need a refill on your cardiac medications before your next appointment, please call your pharmacy*  Lab Work: Your provider would like for you to return in 3 months on the same day as your echo to have the following labs drawn: CMET and TSH. You do not need an appointment for the lab. Once in our office lobby there is a podium where you can sign in and ring the doorbell to alert [place] that you are here. The lab is open from 8:00 am to 4:30 pm; closed for lunch from 12:45pm-1:45pm.  If you have labs (blood work) drawn today and your tests are completely normal, you will receive your results only by: Marland Kitchen MyChart Message (if you have MyChart) OR . A paper copy in the mail If you have any lab test that is abnormal or we need to change your treatment, we will call you to review the results.  Testing/Procedures: Your physician has requested that you have an echocardiogram in 3 months. Echocardiography is a painless test that uses sound waves to create images of your heart. It provides your doctor with information about the size and shape of your heart and how well your heart's chambers and valves are working. You may receive an ultrasound enhancing agent through an IV if needed to better visualize your heart during the echo.This procedure takes approximately one  hour. There are no restrictions  for this procedure. This will take place at the [address], [address].    Follow-Up: At [facility], you and your health needs are our priority.  As part of our continuing mission to provide you with exceptional heart care, we have created designated Provider Care Teams.  These Care Teams include your primary Cardiologist (physician) and Advanced Practice Providers (APPs -  Physician Assistants and Nurse Practitioners) who all work together to provide you with the care you need, when you need it.  Your next appointment:   3 month(s)  The format for your next appointment:   In Person  Provider:   Thurmon Fair, MD      Signed, Thurmon Fair, MD  01/15/2020 10:04 PM    [facility]

## 2020-01-15 ENCOUNTER — Encounter: Payer: Self-pay | Admitting: Cardiovascular Disease

## 2020-01-19 ENCOUNTER — Telehealth: Payer: Self-pay | Admitting: Cardiovascular Disease

## 2020-01-19 NOTE — Telephone Encounter (Signed)
Patient calling to speak with [name]. He states that she gave him some forms to fill out and he has a couple questions. Please advise.

## 2020-01-19 NOTE — Telephone Encounter (Signed)
Will route to nurse to advise of forms.   Thank you!

## 2020-01-20 ENCOUNTER — Encounter: Payer: Self-pay | Admitting: *Deleted

## 2020-01-20 NOTE — Telephone Encounter (Signed)
The patient called in asking for the fax number so he could send his Eliquis assistance forms. He also asked for samples. We are currently out.

## 2020-01-21 ENCOUNTER — Other Ambulatory Visit: Payer: Self-pay | Admitting: *Deleted

## 2020-01-21 MED ORDER — APIXABAN 5 MG PO TABS: 5.0000 mg | ORAL_TABLET | Freq: Two times a day (BID) | ORAL | 3 refills | Status: DC

## 2020-01-26 NOTE — Telephone Encounter (Signed)
Eliquis assistance has been faxed.  

## 2020-02-03 NOTE — Telephone Encounter (Signed)
The patient's assistance has been denied due it being covered by his insurance plan.

## 2020-02-16 ENCOUNTER — Other Ambulatory Visit: Payer: Self-pay

## 2020-02-16 MED ORDER — AMIODARONE HCL 200 MG PO TABS: 200.0000 mg | ORAL_TABLET | Freq: Every day | ORAL | 5 refills | Status: DC

## 2020-02-16 NOTE — Telephone Encounter (Signed)
Please advise?  On visit in February- I didn't see this medication. Is this okay to refill? Thanks!

## 2020-04-08 ENCOUNTER — Other Ambulatory Visit (HOSPITAL_COMMUNITY): Payer: Medicare Other

## 2020-04-23 ENCOUNTER — Other Ambulatory Visit (HOSPITAL_COMMUNITY): Payer: Medicare Other

## 2020-04-30 ENCOUNTER — Other Ambulatory Visit: Payer: Self-pay

## 2020-04-30 ENCOUNTER — Ambulatory Visit (HOSPITAL_COMMUNITY): Payer: Medicare HMO | Attending: Cardiology

## 2020-04-30 ENCOUNTER — Other Ambulatory Visit: Payer: Self-pay | Admitting: *Deleted

## 2020-04-30 ENCOUNTER — Other Ambulatory Visit: Payer: Medicare HMO | Admitting: *Deleted

## 2020-04-30 DIAGNOSIS — I5022 Chronic systolic (congestive) heart failure: Secondary | ICD-10-CM | POA: Insufficient documentation

## 2020-04-30 DIAGNOSIS — I4819 Other persistent atrial fibrillation: Secondary | ICD-10-CM | POA: Diagnosis not present

## 2020-04-30 LAB — COMPREHENSIVE METABOLIC PANEL
ALT: 18 IU/L (ref 0–44)
AST: 27 IU/L (ref 0–40)
Albumin/Globulin Ratio: 1.5 (ref 1.2–2.2)
Albumin: 4.2 g/dL (ref 3.8–4.8)
Alkaline Phosphatase: 57 IU/L (ref 48–121)
BUN/Creatinine Ratio: 24 (ref 10–24)
BUN: 31 mg/dL — ABNORMAL HIGH (ref 8–27)
Bilirubin Total: 0.6 mg/dL (ref 0.0–1.2)
CO2: 25 mmol/L (ref 20–29)
Calcium: 9.3 mg/dL (ref 8.6–10.2)
Chloride: 102 mmol/L (ref 96–106)
Creatinine, Ser: 1.3 mg/dL — ABNORMAL HIGH (ref 0.76–1.27)
GFR calc Af Amer: 65 mL/min/{1.73_m2} (ref >59)
GFR calc non Af Amer: 56 mL/min/{1.73_m2} — ABNORMAL LOW (ref >59)
Globulin, Total: 2.8 g/dL (ref 1.5–4.5)
Glucose: 93 mg/dL (ref 65–99)
Potassium: 4.7 mmol/L (ref 3.5–5.2)
Sodium: 138 mmol/L (ref 134–144)
Total Protein: 7 g/dL (ref 6.0–8.5)

## 2020-04-30 LAB — TSH: TSH: 1.43 u[IU]/mL (ref 0.450–4.500)

## 2020-04-30 NOTE — Progress Notes (Signed)
c-met  

## 2020-06-08 ENCOUNTER — Other Ambulatory Visit: Payer: Self-pay

## 2020-06-08 ENCOUNTER — Ambulatory Visit: Payer: Medicare HMO | Admitting: Cardiovascular Disease

## 2020-06-08 ENCOUNTER — Encounter: Payer: Self-pay | Admitting: Cardiovascular Disease

## 2020-06-08 VITALS — BP 120/68 | HR 68 | Ht 75.0 in | Wt 160.0 lb

## 2020-06-08 DIAGNOSIS — Z7901 Long term (current) use of anticoagulants: Secondary | ICD-10-CM

## 2020-06-08 DIAGNOSIS — I5022 Chronic systolic (congestive) heart failure: Secondary | ICD-10-CM

## 2020-06-08 DIAGNOSIS — I4819 Other persistent atrial fibrillation: Secondary | ICD-10-CM

## 2020-06-08 DIAGNOSIS — Z79899 Other long term (current) drug therapy: Secondary | ICD-10-CM | POA: Diagnosis not present

## 2020-06-08 DIAGNOSIS — Z5181 Encounter for therapeutic drug level monitoring: Secondary | ICD-10-CM

## 2020-06-08 DIAGNOSIS — N1831 Chronic kidney disease, stage 3a: Secondary | ICD-10-CM | POA: Diagnosis not present

## 2020-06-08 MED ORDER — LOSARTAN POTASSIUM 25 MG PO TABS: 12.5000 mg | ORAL_TABLET | Freq: Every day | ORAL | 4 refills | Status: DC

## 2020-06-08 NOTE — Patient Instructions (Addendum)
Medication Instructions:  START Losartan 12.5 mg once daily (half a tablet) STOP the Amiodarone  *If you need a refill on your cardiac medications before your next appointment, please call your pharmacy*   Lab Work: None ordered If you have labs (blood work) drawn today and your tests are completely normal, you will receive your results only by: Marland Kitchen MyChart Message (if you have MyChart) OR . A paper copy in the mail If you have any lab test that is abnormal or we need to change your treatment, we will call you to review the results.   Testing/Procedures: None ordered   Follow-Up: At [facility], you and your health needs are our priority.  As part of our continuing mission to provide you with exceptional heart care, we have created designated Provider Care Teams.  These Care Teams include your primary Cardiologist (physician) and Advanced Practice Providers (APPs -  Physician Assistants and Nurse Practitioners) who all work together to provide you with the care you need, when you need it.  We recommend signing up for the patient portal called "MyChart".  Sign up information is provided on this After Visit Summary.  MyChart is used to connect with patients for Virtual Visits (Telemedicine).  Patients are able to view lab/test results, encounter notes, upcoming appointments, etc.  Non-urgent messages can be sent to your provider as well.   To learn more about what you can do with MyChart, go to [email or web address].    Your next appointment:   3 month(s)  The format for your next appointment:   In Person  Provider:   Thurmon Fair, MD   Dr. Royann Shivers would like you to check your blood pressure daily for the next 2 weeks.  Keep a journal of these daily blood pressure and heart rate readings and call our office or send a message through MyChart with the results. Thank you!

## 2020-06-10 ENCOUNTER — Encounter: Payer: Self-pay | Admitting: Cardiovascular Disease

## 2020-06-10 NOTE — Progress Notes (Signed)
Cardiology Office Note:    Date:  06/10/2020   ID:  Michael Noble, DOB 1951/02/20, MRN [id number]  PCP:  Patient, No Pcp Per  [facility] Cardiologist:  Thurmon Fair, MD  [facility] Electrophysiologist:  None   Referring MD: No ref. provider found   Chief Complaint  Patient presents with  . Congestive Heart Failure  . Atrial Fibrillation    History of Present Illness:    Michael Noble is a 69 y.o. adult with a hx of HFrEF and persistent AFIb, presenting with RVR and LVEF 21% in October 2020.  Symptom onset was insidious.  He has never had angina.  His nuclear perfusion study showed normal myocardial perfusion.  Following initiation of heart failure therapy he has had a progressive improvement in left ventricular systolic function with most recent LVEF 35-40% by echocardiogram 04/30/2020.  He has not had any further episodes of heart failure exacerbation.  He underwent cardioversion after initiation of anticoagulant and antiarrhythmic therapy (Amiodarone) and has not had any recurrence of atrial fibrillation.  He checks his vital signs daily and his blood pressure is consistently low 110s/60s, heart rate in the 60s and he checks his rhythm with electronic device that has shown normal sinus rhythm weekly for the last many months.  His weight is very steady at around 160 pounds (matched office and home scales).  He's not had any problems with fatigue, palpitations, syncope, lower extremity edema, focal neurological events or claudication.  He has good functional status.  He does not feel limited by shortness of breath; he is not taking loop diuretics.  He has not had any falls, injuries or serious bleeding problems and is compliant with anticoagulation with apixaban.  He is on high-dose carvedilol.  Initial attempts to start treatment with angiotensin receptor blocker were unsuccessful due to worsening renal parameters (creatinine increased from 1.3-1.94 and he had borderline  hyperkalemia).  Most recent creatinine was 1.3 on 04/30/2020 (improved).  He has an excellent LDL cholesterol at 52 but has a low HDL 23 (last checked October 2020)  Past Medical History:  Diagnosis Date  . Arthritis   . Hepatitis    hx of 35 years ago     Past Surgical History:  Procedure Laterality Date  . TOTAL HIP ARTHROPLASTY Left 06/25/2015   Procedure: LEFT TOTAL HIP ARTHROPLASTY ANTERIOR APPROACH;  Surgeon: Kathryne Hitch, MD;  Location: [facility];  Service: Orthopedics;  Laterality: Left;    Current Medications: Current Meds  Medication Sig  . apixaban (ELIQUIS) 5 MG TABS tablet Take 1 tablet (5 mg total) by mouth 2 (two) times daily.  . carvedilol (COREG) 25 MG tablet Take 1 tablet (25 mg total) by mouth 2 (two) times daily with a meal.  . losartan (COZAAR) 25 MG tablet Take 0.5 tablets (12.5 mg total) by mouth daily.  Marland Kitchen Lysine 500 MG TABS Take 500 mg by mouth daily.  . Misc Natural Products (URINOZINC PO) Take 1 tablet by mouth daily.  . Multiple Vitamin (MULTIVITAMIN WITH MINERALS) TABS tablet Take 1 tablet by mouth daily. 50 +  . Multiple Vitamins-Minerals (EQ VISION FORMULA 50+ PO) Take 1 tablet by mouth daily.  . Turmeric 400 MG CAPS Take 400 mg by mouth daily.   . [DISCONTINUED] amiodarone (PACERONE) 200 MG tablet Take 1 tablet (200 mg total) by mouth daily. TAKE 1 TABLET BY MOUTH 2 TIMES A DAY FOR 1 WEEK. THEN 200 MG DAILY  . [DISCONTINUED] losartan (COZAAR) 25 MG tablet Take  0.5 tablets (12.5 mg total) by mouth daily.     Allergies:   Patient has no known allergies.   Social History   Socioeconomic History  . Marital status: Married    Spouse name: Not on file  . Number of children: Not on file  . Years of education: Not on file  . Highest education level: Not on file  Occupational History  . Not on file  Tobacco Use  . Smoking status: Former Games developer  . Smokeless tobacco: Former Neurosurgeon    Types: Chew  Substance and Sexual Activity  . Alcohol use: No    . Drug use: No  . Sexual activity: Not on file  Other Topics Concern  . Not on file  Social History Narrative  . Not on file   Social Determinants of Health   Financial Resource Strain:   . Difficulty of Paying Living Expenses:   Food Insecurity:   . Worried About Programme researcher, broadcasting/film/video in the Last Year:   . Barista in the Last Year:   Transportation Needs:   . Freight forwarder (Medical):   Marland Kitchen Lack of Transportation (Non-Medical):   Physical Activity:   . Days of Exercise per Week:   . Minutes of Exercise per Session:   Stress:   . Feeling of Stress :   Social Connections:   . Frequency of Communication with Friends and Family:   . Frequency of Social Gatherings with Friends and Family:   . Attends Religious Services:   . Active Member of Clubs or Organizations:   . Attends Banker Meetings:   Marland Kitchen Marital Status:      Family History: The patient's family history includes Aneurysm in his father; Atrial fibrillation in his father; Heart disease in his mother.  ROS:   Please see the history of present illness.     All other systems reviewed and are negative.  EKGs/Labs/Other Studies Reviewed:    The following studies were reviewed today: Echocardiogram 04/30/2020 1. Left ventricular ejection fraction, by estimation, is 35 to 40%. The  left ventricle has moderately decreased function. The left ventricle  demonstrates global hypokinesis. Left ventricular diastolic parameters are  indeterminate.  2. Right ventricular systolic function is normal. The right ventricular  size is normal. There is normal pulmonary artery systolic pressure.  3. Left atrial size was mildly dilated.  4. Right atrial size was mildly dilated.  5. The mitral valve is degenerative. Mild mitral valve regurgitation. No  evidence of mitral stenosis.  6. The aortic valve is grossly normal. Aortic valve regurgitation is  mild. Mild aortic valve sclerosis is present, with no  evidence of aortic  valve stenosis.  7. The inferior vena cava is dilated in size with <50% respiratory  variability, suggesting right atrial pressure of 15 mmHg.   Conclusion(s)/Recommendation(s): EF appears slightly improved compared to  prior.  EKG:  EKG is  ordered today.  The ekg ordered today demonstrates sinus rhythm with first-degree AV block and rightward axis, mildly prolonged QTC 474 ms.  No ischemic ST-T changes.  Recent Labs: 10/06/2019: B Natriuretic Peptide 146.1; Magnesium 2.2; Platelets 187 10/14/2019: Hemoglobin 14.6 04/30/2020: ALT 18; BUN 31; Creatinine, Ser 1.30; Potassium 4.7; Sodium 138; TSH 1.430  Recent Lipid Panel    Component Value Date/Time   CHOL 83 09/04/2019 0223   TRIG 41 09/04/2019 0223   HDL 23 (L) 09/04/2019 0223   CHOLHDL 3.6 09/04/2019 0223   VLDL 8 09/04/2019 0223  LDLCALC 52 09/04/2019 0223    Physical Exam:    VS:  BP 120/68   Pulse 68   Ht 6\' 3"  (1.905 m)   Wt 160 lb (72.6 kg)   SpO2 99%   BMI 20.00 kg/m     Wt Readings from Last 3 Encounters:  06/08/20 160 lb (72.6 kg)  01/14/20 166 lb 12.8 oz (75.7 kg)  12/30/19 161 lb 3.2 oz (73.1 kg)     GEN: Very lean, well nourished, well developed in no acute distress HEENT: Normal NECK: No JVD; No carotid bruits LYMPHATICS: No lymphadenopathy CARDIAC: RRR, no murmurs, rubs, gallops RESPIRATORY:  Clear to auscultation without rales, wheezing or rhonchi  ABDOMEN: Soft, non-tender, non-distended MUSCULOSKELETAL:  No edema; No deformity  SKIN: Warm and dry NEUROLOGIC:  Alert and oriented x 3 PSYCHIATRIC:  Normal affect   ASSESSMENT:    1. Chronic systolic heart failure (HCC)   2. Persistent atrial fibrillation (HCC)   3. Long term current use of anticoagulant   4. Encounter for monitoring amiodarone therapy   5. Stage 3a chronic kidney disease    PLAN:    In order of problems listed above:  1. CHF: There has been a significant but incomplete recovery of left ventricular  systolic function with rhythm management and beta-blocker therapy.  However, LVEF has not returned to normal suggesting that he does not have exclusively tachycardia related illness, but probably has an underlying cardiomyopathy.  Continue carvedilol.  We will try 1 more time to initiate a very low-dose of angiotensin receptor blocker.  He is euvolemic and has NYHA functional class I without the use of loop diuretics. 2. AFib: Excellent response antiarrhythmic therapy with maintenance of normal rhythm for several months.  Compliant with Eliquis.  CHA2DS2-VASc 2 (age, HF). 3. Anticoagulation: Well-tolerated without bleeding problems. 4. Amiodarone: Not had any toxicity so far but concerned long-term risks.  We will try to wean the dose off this medication.  Consider referral for ablation or switch to dofetilide if arrhythmia recurs after stopping the amiodarone. 5. CKD 3a: Etiology uncertain.  Monitor electrolytes and renal function carefully after restarting ARB.  Unlikely to tolerate spironolactone, but may decide to try Entresto if renal function does not deteriorate on losartan.   Medication Adjustments/Labs and Tests Ordered: Current medicines are reviewed at length with the patient today.  Concerns regarding medicines are outlined above.  Orders Placed This Encounter  Procedures  . EKG 12-Lead   Meds ordered this encounter  Medications  . losartan (COZAAR) 25 MG tablet    Sig: Take 0.5 tablets (12.5 mg total) by mouth daily.    Dispense:  15 tablet    Refill:  4    Patient Instructions  Medication Instructions:  START Losartan 12.5 mg once daily (half a tablet) STOP the Amiodarone  *If you need a refill on your cardiac medications before your next appointment, please call your pharmacy*   Lab Work: None ordered If you have labs (blood work) drawn today and your tests are completely normal, you will receive your results only by: 02/27/20 MyChart Message (if you have MyChart) OR . A  paper copy in the mail If you have any lab test that is abnormal or we need to change your treatment, we will call you to review the results.   Testing/Procedures: None ordered   Follow-Up: At [facility], you and your health needs are our priority.  As part of our continuing mission to provide you with exceptional heart care,  we have created designated Provider Care Teams.  These Care Teams include your primary Cardiologist (physician) and Advanced Practice Providers (APPs -  Physician Assistants and Nurse Practitioners) who all work together to provide you with the care you need, when you need it.  We recommend signing up for the patient portal called "MyChart".  Sign up information is provided on this After Visit Summary.  MyChart is used to connect with patients for Virtual Visits (Telemedicine).  Patients are able to view lab/test results, encounter notes, upcoming appointments, etc.  Non-urgent messages can be sent to your provider as well.   To learn more about what you can do with MyChart, go to [email or web address].    Your next appointment:   3 month(s)  The format for your next appointment:   In Person  Provider:   Thurmon Fair, MD   Dr. Royann Shivers would like you to check your blood pressure daily for the next 2 weeks.  Keep a journal of these daily blood pressure and heart rate readings and call our office or send a message through MyChart with the results. Thank you!     Signed, Thurmon Fair, MD  06/10/2020 8:12 AM    [facility]

## 2020-07-20 ENCOUNTER — Other Ambulatory Visit: Payer: Self-pay

## 2020-08-31 NOTE — Progress Notes (Signed)
Cardiology Office Note:    Date:  09/01/2020   ID:  Michael Noble, DOB November 21, 1950, MRN [id number]  PCP:  Patient, No Pcp Per  [facility] Cardiologist:  Thurmon Fair, MD  [facility] Electrophysiologist:  None   Referring MD: No ref. provider found   Chief Complaint  Patient presents with  . Congestive Heart Failure  . Atrial Fibrillation    History of Present Illness:    Michael Noble is a 69 y.o. adult with a hx of HFrEF and persistent AFIb, presenting with RVR and LVEF 21% in October 2020.  Symptom onset was insidious.  He has never had angina.  His nuclear perfusion study showed normal myocardial perfusion.  Following initiation of heart failure therapy he has had a progressive improvement in left ventricular systolic function with most recent LVEF 35-40% by echocardiogram 04/30/2020.  He has not had any further episodes of heart failure exacerbation.  He underwent cardioversion after initiation of anticoagulant and antiarrhythmic therapy (Amiodarone) and has not had any recurrence of atrial fibrillation.  He checks his vital signs daily and his blood pressure is consistently low 110s/60s, heart rate in the 60s and he checks his rhythm with electronic device that has shown normal sinus rhythm weekly for the last many months.  His weight is very steady at around 160 pounds (matched office and home scales).  He feels well. The patient specifically denies any chest pain at rest exertion, dyspnea at rest or with exertion, orthopnea, paroxysmal nocturnal dyspnea, syncope, palpitations, focal neurological deficits, intermittent claudication, lower extremity edema, unexplained weight gain, cough, hemoptysis or wheezing.  We have stopped amiodarone due to concerns regarding potential side effects and he has not had recurrent atrial fibrillation.  He faithfully checks his rhythm with a Kardia device and sends me an update weekly.  He is on high-dose carvedilol.  Initial attempts to  start treatment with angiotensin receptor blocker were unsuccessful due to worsening renal parameters (creatinine increased from 1.3-1.94 and he had borderline hyperkalemia).  Most recent creatinine was 1.3 on 04/30/2020 (improved).  He has an excellent LDL cholesterol at 52 but has a low HDL 23 (last checked October 2020)  Past Medical History:  Diagnosis Date  . Arthritis   . Hepatitis    hx of 35 years ago     Past Surgical History:  Procedure Laterality Date  . TOTAL HIP ARTHROPLASTY Left 06/25/2015   Procedure: LEFT TOTAL HIP ARTHROPLASTY ANTERIOR APPROACH;  Surgeon: Kathryne Hitch, MD;  Location: [facility];  Service: Orthopedics;  Laterality: Left;    Current Medications: Current Meds  Medication Sig  . apixaban (ELIQUIS) 5 MG TABS tablet Take 1 tablet (5 mg total) by mouth 2 (two) times daily.  . carvedilol (COREG) 25 MG tablet Take 1 tablet (25 mg total) by mouth 2 (two) times daily with a meal.  . losartan (COZAAR) 25 MG tablet Take 1 tablet (25 mg total) by mouth daily.  Marland Kitchen Lysine 500 MG TABS Take 500 mg by mouth daily.  . Misc Natural Products (URINOZINC PO) Take 1 tablet by mouth daily.  . Multiple Vitamin (MULTIVITAMIN WITH MINERALS) TABS tablet Take 1 tablet by mouth daily. 50 +  . Multiple Vitamins-Minerals (EQ VISION FORMULA 50+ PO) Take 1 tablet by mouth daily.  . Turmeric 400 MG CAPS Take 400 mg by mouth daily.   . [DISCONTINUED] losartan (COZAAR) 25 MG tablet Take 0.5 tablets (12.5 mg total) by mouth daily.     Allergies:  Patient has no known allergies.   Social History   Socioeconomic History  . Marital status: Married    Spouse name: Not on file  . Number of children: Not on file  . Years of education: Not on file  . Highest education level: Not on file  Occupational History  . Not on file  Tobacco Use  . Smoking status: Former Games developer  . Smokeless tobacco: Former Neurosurgeon    Types: Chew  Substance and Sexual Activity  . Alcohol use: No  . Drug use:  No  . Sexual activity: Not on file  Other Topics Concern  . Not on file  Social History Narrative  . Not on file   Social Determinants of Health   Financial Resource Strain:   . Difficulty of Paying Living Expenses: Not on file  Food Insecurity:   . Worried About Programme researcher, broadcasting/film/video in the Last Year: Not on file  . Ran Out of Food in the Last Year: Not on file  Transportation Needs:   . Lack of Transportation (Medical): Not on file  . Lack of Transportation (Non-Medical): Not on file  Physical Activity:   . Days of Exercise per Week: Not on file  . Minutes of Exercise per Session: Not on file  Stress:   . Feeling of Stress : Not on file  Social Connections:   . Frequency of Communication with Friends and Family: Not on file  . Frequency of Social Gatherings with Friends and Family: Not on file  . Attends Religious Services: Not on file  . Active Member of Clubs or Organizations: Not on file  . Attends Banker Meetings: Not on file  . Marital Status: Not on file     Family History: The patient's family history includes Aneurysm in his father; Atrial fibrillation in his father; Heart disease in his mother.  ROS:   Please see the history of present illness.     All other systems reviewed and are negative.  EKGs/Labs/Other Studies Reviewed:    The following studies were reviewed today: Echocardiogram 04/30/2020 1. Left ventricular ejection fraction, by estimation, is 35 to 40%. The  left ventricle has moderately decreased function. The left ventricle  demonstrates global hypokinesis. Left ventricular diastolic parameters are  indeterminate.  2. Right ventricular systolic function is normal. The right ventricular  size is normal. There is normal pulmonary artery systolic pressure.  3. Left atrial size was mildly dilated.  4. Right atrial size was mildly dilated.  5. The mitral valve is degenerative. Mild mitral valve regurgitation. No  evidence of mitral  stenosis.  6. The aortic valve is grossly normal. Aortic valve regurgitation is  mild. Mild aortic valve sclerosis is present, with no evidence of aortic  valve stenosis.  7. The inferior vena cava is dilated in size with <50% respiratory  variability, suggesting right atrial pressure of 15 mmHg.   Conclusion(s)/Recommendation(s): EF appears slightly improved compared to  prior.  EKG:  EKG is  ordered today.  The ekg ordered today demonstrates sinus rhythm with first-degree AV block and rightward axis, mildly prolonged QTC 474 ms.  No ischemic ST-T changes.  Recent Labs: 10/06/2019: B Natriuretic Peptide 146.1; Magnesium 2.2; Platelets 187 10/14/2019: Hemoglobin 14.6 04/30/2020: ALT 18; BUN 31; Creatinine, Ser 1.30; Potassium 4.7; Sodium 138; TSH 1.430  Recent Lipid Panel    Component Value Date/Time   CHOL 83 09/04/2019 0223   TRIG 41 09/04/2019 0223   HDL 23 (L) 09/04/2019 7425  CHOLHDL 3.6 09/04/2019 0223   VLDL 8 09/04/2019 0223   LDLCALC 52 09/04/2019 0223    Physical Exam:    VS:  BP 126/69   Pulse 68   Ht 6\' 2"  (1.88 m)   Wt 159 lb 9.6 oz (72.4 kg)   SpO2 100%   BMI 20.49 kg/m     Wt Readings from Last 3 Encounters:  09/01/20 159 lb 9.6 oz (72.4 kg)  06/08/20 160 lb (72.6 kg)  01/14/20 166 lb 12.8 oz (75.7 kg)     General: Alert, oriented x3, no distress, very lean Head: no evidence of trauma, PERRL, EOMI, no exophtalmos or lid lag, no myxedema, no xanthelasma; normal ears, nose and oropharynx Neck: normal jugular venous pulsations and no hepatojugular reflux; brisk carotid pulses without delay and no carotid bruits Chest: clear to auscultation, no signs of consolidation by percussion or palpation, normal fremitus, symmetrical and full respiratory excursions Cardiovascular: normal position and quality of the apical impulse, regular rhythm, normal first and second heart sounds, no murmurs, rubs or gallops Abdomen: no tenderness or distention, no masses by  palpation, no abnormal pulsatility or arterial bruits, normal bowel sounds, no hepatosplenomegaly Extremities: He has atrophy of the thenar muscles bilaterally; no clubbing, cyanosis or edema; 2+ radial, ulnar and brachial pulses bilaterally; 2+ right femoral, posterior tibial and dorsalis pedis pulses; 2+ left femoral, posterior tibial and dorsalis pedis pulses; no subclavian or femoral bruits Neurological: grossly nonfocal Psych: Normal mood and affect   ASSESSMENT:    1. Chronic systolic heart failure (HCC)   2. PAF (paroxysmal atrial fibrillation) (HCC)   3. Long term (current) use of anticoagulants   4. Encounter for monitoring amiodarone therapy   5. Stage 3a chronic kidney disease (HCC)    PLAN:    In order of problems listed above:  1. CHF: NYHA class 1 and euvolemic without diuretics. There has been a significant, but incomplete recovery of left ventricular systolic function with rhythm management and beta-blocker therapy.  This suggests that his LV dysfunction is not exclusively a tachycardia related illness, but he probably has an underlying cardiomyopathy.  Continue carvedilol. Increase ARB slowly and recheck renal function/K level.  He has striking atrophy of the thenar muscles in both hands, but this could be related to repeat stress injury due to his profession 01/16/20), but makes me wonder about a more systemic process such as a skeletal and cardiac myopathy.  There is no family history of genetic myopathy or heart failure.  His treatment would not be directly impacted up with make this diagnosis, but it could have implications for his personal and family prognosis.  Consider referral for genetic testing. 2. AFib: Maintaining NSR 3 months after stopping amiodarone. Consider referral for ablation or switch to dofetilide if arrhythmia recurs after stopping the amiodarone. Compliant with Eliquis.  CHA2DS2-VASc 2 (age, HF). 3. Anticoagulation: Well-tolerated without bleeding  problems. 4. CKD 3a: Etiology uncertain.  Monitor electrolytes and renal function carefully after increasing ARB.  Unlikely to tolerate spironolactone, but may decide to try Entresto if renal function does not deteriorate on higher dose losartan.  Check BMET in early December.  Medication Adjustments/Labs and Tests Ordered: Current medicines are reviewed at length with the patient today.  Concerns regarding medicines are outlined above.  No orders of the defined types were placed in this encounter.  Meds ordered this encounter  Medications  . losartan (COZAAR) 25 MG tablet    Sig: Take 1 tablet (25 mg total) by mouth  daily.    Dispense:  30 tablet    Refill:  2    Patient Instructions  Medication Instructions:  INCREASE Losartan to 25 mg (1 tablet) daily   *If you need a refill on your cardiac medications before your next appointment, please call your pharmacy*  Follow-Up: At [facility], you and your health needs are our priority.  As part of our continuing mission to provide you with exceptional heart care, we have created designated Provider Care Teams.  These Care Teams include your primary Cardiologist (physician) and Advanced Practice Providers (APPs -  Physician Assistants and Nurse Practitioners) who all work together to provide you with the care you need, when you need it.  We recommend signing up for the patient portal called "MyChart".  Sign up information is provided on this After Visit Summary.  MyChart is used to connect with patients for Virtual Visits (Telemedicine).  Patients are able to view lab/test results, encounter notes, upcoming appointments, etc.  Non-urgent messages can be sent to your provider as well.   To learn more about what you can do with MyChart, go to [email or web address].    Your next appointment:   3 month(s)  The format for your next appointment:   In Person  Provider:   Thurmon Fair, MD         Signed, Thurmon Fair, MD   09/01/2020 11:11 AM    [facility]

## 2020-09-01 ENCOUNTER — Other Ambulatory Visit: Payer: Self-pay

## 2020-09-01 ENCOUNTER — Encounter: Payer: Self-pay | Admitting: Cardiovascular Disease

## 2020-09-01 ENCOUNTER — Ambulatory Visit: Payer: Medicare HMO | Admitting: Cardiovascular Disease

## 2020-09-01 VITALS — BP 126/69 | HR 68 | Ht 74.0 in | Wt 159.6 lb

## 2020-09-01 DIAGNOSIS — I480 Paroxysmal atrial fibrillation: Secondary | ICD-10-CM

## 2020-09-01 DIAGNOSIS — Z7901 Long term (current) use of anticoagulants: Secondary | ICD-10-CM

## 2020-09-01 DIAGNOSIS — Z5181 Encounter for therapeutic drug level monitoring: Secondary | ICD-10-CM | POA: Diagnosis not present

## 2020-09-01 DIAGNOSIS — Z79899 Other long term (current) drug therapy: Secondary | ICD-10-CM

## 2020-09-01 DIAGNOSIS — I5022 Chronic systolic (congestive) heart failure: Secondary | ICD-10-CM | POA: Diagnosis not present

## 2020-09-01 DIAGNOSIS — N1831 Chronic kidney disease, stage 3a: Secondary | ICD-10-CM | POA: Diagnosis not present

## 2020-09-01 MED ORDER — LOSARTAN POTASSIUM 25 MG PO TABS: 25.0000 mg | ORAL_TABLET | Freq: Every day | ORAL | 2 refills | Status: DC

## 2020-09-01 NOTE — Patient Instructions (Addendum)
Medication Instructions:  INCREASE Losartan to 25 mg (1 tablet) daily   *If you need a refill on your cardiac medications before your next appointment, please call your pharmacy*  Labs: BMET 1st week of December-lab slips to be mailed to you  Follow-Up: At [facility], you and your health needs are our priority.  As part of our continuing mission to provide you with exceptional heart care, we have created designated Provider Care Teams.  These Care Teams include your primary Cardiologist (physician) and Advanced Practice Providers (APPs -  Physician Assistants and Nurse Practitioners) who all work together to provide you with the care you need, when you need it.  We recommend signing up for the patient portal called "MyChart".  Sign up information is provided on this After Visit Summary.  MyChart is used to connect with patients for Virtual Visits (Telemedicine).  Patients are able to view lab/test results, encounter notes, upcoming appointments, etc.  Non-urgent messages can be sent to your provider as well.   To learn more about what you can do with MyChart, go to [email or web address].    Your next appointment:   3 month(s)  The format for your next appointment:   In Person  Provider:   Thurmon Fair, MD

## 2020-09-14 NOTE — Addendum Note (Signed)
Addended by: Dorris Fetch on: 09/14/2020 04:45 PM   Modules accepted: Orders

## 2020-11-01 DIAGNOSIS — R69 Illness, unspecified: Secondary | ICD-10-CM | POA: Diagnosis not present

## 2020-12-02 ENCOUNTER — Other Ambulatory Visit: Payer: Self-pay

## 2020-12-02 ENCOUNTER — Ambulatory Visit: Payer: Medicare HMO | Admitting: Cardiovascular Disease

## 2020-12-02 ENCOUNTER — Encounter: Payer: Self-pay | Admitting: Cardiovascular Disease

## 2020-12-02 ENCOUNTER — Other Ambulatory Visit: Payer: Self-pay | Admitting: *Deleted

## 2020-12-02 VITALS — BP 130/80 | HR 62 | Ht 75.0 in | Wt 158.6 lb

## 2020-12-02 DIAGNOSIS — I480 Paroxysmal atrial fibrillation: Secondary | ICD-10-CM

## 2020-12-02 DIAGNOSIS — N1831 Chronic kidney disease, stage 3a: Secondary | ICD-10-CM

## 2020-12-02 DIAGNOSIS — I5022 Chronic systolic (congestive) heart failure: Secondary | ICD-10-CM

## 2020-12-02 DIAGNOSIS — Z7901 Long term (current) use of anticoagulants: Secondary | ICD-10-CM | POA: Diagnosis not present

## 2020-12-02 NOTE — Progress Notes (Signed)
Cardiology Office Note:    Date:  12/02/2020   ID:  Michael Noble, DOB [birth year], MRN [id number]  PCP:  Patient, No Pcp Per  [facility] Cardiologist:  Thurmon Fair, MD  [facility] Electrophysiologist:  None   Referring MD: No ref. provider found   No chief complaint on file.   History of Present Illness:    Michael Noble is a 70 y.o. adult with a hx of HFrEF and persistent AFIb, presenting with RVR and LVEF 21% in October 2020.  Symptom onset was insidious.  He has never had angina.  His nuclear perfusion study showed normal myocardial perfusion.  Following initiation of heart failure therapy he has had a progressive improvement in left ventricular systolic function with most recent LVEF 35-40% by echocardiogram 04/30/2020.  He has not had any further episodes of heart failure exacerbation.  He underwent cardioversion after initiation of anticoagulant and antiarrhythmic therapy (Amiodarone) and has not had any recurrence of atrial fibrillation.    We have steadily tried to increase his heart failure medications, but when placed on losartan 50 mg daily he developed severe symptomatic hypotension (80/50).  He feels well on the 25 mg dose.  Unable to tolerate Entresto.  Amiodarone was stopped almost a year ago.  He monitors his blood pressure and heart rate daily and checks his rhythm periodically with a Kardia device.  He has consistently been in sinus rhythm around 60 bpm and his blood pressure is typically in the 110-120/60-70 range.  His weight has also been very stable in the 16+/-2 pound range.  The patient specifically denies any chest pain at rest exertion, dyspnea at rest or with exertion, orthopnea, paroxysmal nocturnal dyspnea, syncope, palpitations, focal neurological deficits, intermittent claudication, lower extremity edema, unexplained weight gain, cough, hemoptysis or wheezing. He has not had any serious injuries or bleeding problems or falls.  He does not  complain of any weakness of his lower limb or core muscles.  He is still hoping to one day go on the cruise that was scheduled earlier during the COVID pandemic and keeps getting canceled.  He is on high-dose carvedilol.  Initial attempts to start treatment with angiotensin receptor blocker were unsuccessful due to worsening renal parameters (creatinine increased from 1.3-1.94 and he had borderline hyperkalemia) and symptomatic hypotension.  Most recent creatinine was 1.3 on 04/30/2020 (improved).  He has an excellent LDL cholesterol at 52 but has a low HDL 23 (last checked October 2020)  Past Medical History:  Diagnosis Date   Arthritis    Hepatitis    hx of 35 years ago     Past Surgical History:  Procedure Laterality Date   TOTAL HIP ARTHROPLASTY Left 06/25/2015   Procedure: LEFT TOTAL HIP ARTHROPLASTY ANTERIOR APPROACH;  Surgeon: Kathryne Hitch, MD;  Location: [facility];  Service: Orthopedics;  Laterality: Left;    Current Medications: Current Meds  Medication Sig   apixaban (ELIQUIS) 5 MG TABS tablet Take 1 tablet (5 mg total) by mouth 2 (two) times daily.   carvedilol (COREG) 25 MG tablet Take 1 tablet (25 mg total) by mouth 2 (two) times daily with a meal.   losartan (COZAAR) 25 MG tablet Take 1 tablet (25 mg total) by mouth daily.   Lysine 500 MG TABS Take 500 mg by mouth daily.   Misc Natural Products (URINOZINC PO) Take 1 tablet by mouth daily.   Multiple Vitamin (MULTIVITAMIN WITH MINERALS) TABS tablet Take 1 tablet by mouth daily. 50 +  Multiple Vitamins-Minerals (EQ VISION FORMULA 50+ PO) Take 1 tablet by mouth daily.   Turmeric 400 MG CAPS Take 400 mg by mouth daily.      Allergies:   Patient has no known allergies.   Social History   Socioeconomic History   Marital status: Married    Spouse name: Not on file   Number of children: Not on file   Years of education: Not on file   Highest education level: Not on file  Occupational History   Not  on file  Tobacco Use   Smoking status: Former Smoker   Smokeless tobacco: Former Neurosurgeon    Types: Chew  Substance and Sexual Activity   Alcohol use: No   Drug use: No   Sexual activity: Not on file  Other Topics Concern   Not on file  Social History Narrative   Not on file   Social Determinants of Health   Financial Resource Strain: Not on file  Food Insecurity: Not on file  Transportation Needs: Not on file  Physical Activity: Not on file  Stress: Not on file  Social Connections: Not on file     Family History: The patient's family history includes Aneurysm in his father; Atrial fibrillation in his father; Heart disease in his mother.  ROS:   Please see the history of present illness.     All other systems reviewed and are negative.  EKGs/Labs/Other Studies Reviewed:    The following studies were reviewed today: Echocardiogram 04/30/2020 1. Left ventricular ejection fraction, by estimation, is 35 to 40%. The  left ventricle has moderately decreased function. The left ventricle  demonstrates global hypokinesis. Left ventricular diastolic parameters are  indeterminate.  2. Right ventricular systolic function is normal. The right ventricular  size is normal. There is normal pulmonary artery systolic pressure.  3. Left atrial size was mildly dilated.  4. Right atrial size was mildly dilated.  5. The mitral valve is degenerative. Mild mitral valve regurgitation. No  evidence of mitral stenosis.  6. The aortic valve is grossly normal. Aortic valve regurgitation is  mild. Mild aortic valve sclerosis is present, with no evidence of aortic  valve stenosis.  7. The inferior vena cava is dilated in size with <50% respiratory  variability, suggesting right atrial pressure of 15 mmHg.   Conclusion(s)/Recommendation(s): EF appears slightly improved compared to  prior.  EKG:  EKG is  ordered today.  She was normal sinus rhythm, subtle ST segment depression in leads  III, aVF, V6, normal QTC 416 ms.  Recent Labs: 04/30/2020: ALT 18; BUN 31; Creatinine, Ser 1.30; Potassium 4.7; Sodium 138; TSH 1.430  Recent Lipid Panel    Component Value Date/Time   CHOL 83 09/04/2019 0223   TRIG 41 09/04/2019 0223   HDL 23 (L) 09/04/2019 0223   CHOLHDL 3.6 09/04/2019 0223   VLDL 8 09/04/2019 0223   LDLCALC 52 09/04/2019 0223    Physical Exam:    VS:  BP 130/80    Pulse 62    Ht 6\' 3"  (1.905 m)    Wt 158 lb 9.6 oz (71.9 kg)    SpO2 98%    BMI 19.82 kg/m     Wt Readings from Last 3 Encounters:  12/02/20 158 lb 9.6 oz (71.9 kg)  09/01/20 159 lb 9.6 oz (72.4 kg)  06/08/20 160 lb (72.6 kg)      General: Alert, oriented x3, no distress, appears lean, fit. Head: no evidence of trauma, PERRL, EOMI, no exophtalmos or  lid lag, no myxedema, no xanthelasma; normal ears, nose and oropharynx Neck: normal jugular venous pulsations and no hepatojugular reflux; brisk carotid pulses without delay and no carotid bruits Chest: clear to auscultation, no signs of consolidation by percussion or palpation, normal fremitus, symmetrical and full respiratory excursions Cardiovascular: normal position and quality of the apical impulse, regular rhythm, normal first and second heart sounds, no murmurs, rubs or gallops Abdomen: no tenderness or distention, no masses by palpation, no abnormal pulsatility or arterial bruits, normal bowel sounds, no hepatosplenomegaly Extremities: He has prominent wasting of his thenar and interosseous muscles in both hands as well as some temporal wasting; no clubbing, cyanosis or edema; 2+ radial, ulnar and brachial pulses bilaterally; 2+ right femoral, posterior tibial and dorsalis pedis pulses; 2+ left femoral, posterior tibial and dorsalis pedis pulses; no subclavian or femoral bruits Neurological: grossly nonfocal Psych: Normal mood and affect    ASSESSMENT:    1. Chronic systolic heart failure (HCC)   2. PAF (paroxysmal atrial fibrillation) (HCC)    3. Long term (current) use of anticoagulants   4. Stage 3a chronic kidney disease (HCC)    PLAN:    In order of problems listed above:  1. CHF: Asymptomatic and euvolemic without loop diuretic therapy.  He is very disciplined today.. There has been a significant, but incomplete recovery of left ventricular systolic function with rhythm management and beta-blocker therapy.  This suggests that his LV dysfunction is not exclusively a tachycardia related illness, but he probably has an underlying cardiomyopathy.  Continue carvedilol and was able to tolerate higher doses for Entresto and symptoms.  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  There is no family history of genetic myopathy or heart failure.  His treatment would not be directly impacted up with make this diagnosis, but it could have implications for his personal and family prognosis.  Consider referral for genetic testing, but at this point he expresses no interest in this.. 2. AFib: Maintaining NSR 9 months after stopping amiodarone. Consider referral for ablation or switch to dofetilide if arrhythmia recurs after stopping the amiodarone. Compliant with Eliquis.  CHA2DS2-VASc 2 (age, HF). 3. Anticoagulation: No serious bleeding complications. 4. CKD 3a: Renal function had improved at most recent assay.  We will recheck at his next appointment.  Developed hyperkalemia and hypotension with higher doses of losartan so I do not think he will be able to tolerate spironolactone or Entresto.   Medication Adjustments/Labs and Tests Ordered: Current medicines are reviewed at length with the patient today.  Concerns regarding medicines are outlined above.  Orders Placed This Encounter  Procedures   EKG 12-Lead   ECHOCARDIOGRAM COMPLETE   No orders of the  defined types were placed in this encounter.   Patient Instructions  Medication Instructions:  No changes *If you need a refill on your cardiac medications before your next appointment, please call your pharmacy*   Lab Work: None ordered If you have labs (blood work) drawn today and your tests are completely normal, you will receive your results only by:  MyChart Message (if you have MyChart) OR  A paper copy in the mail If you have any lab test that is abnormal or we need to change your treatment, we will call you to review the results.   Testing/Procedures:  Your physician has requested that you have an echocardiogram in June 2022. Echocardiography is a painless test that uses sound waves to create images of your heart. It provides your doctor with information about the size and shape of your heart and how well your hearts chambers and valves are working. You may receive an ultrasound enhancing agent through an IV if needed to better visualize your heart during the echo.This procedure takes approximately one hour. There are no restrictions for this procedure. This will take place at the [address].     Follow-Up: At [facility], you and your health needs are our priority.  As part of our continuing mission to provide you with exceptional heart care, we have created designated Provider Care Teams.  These Care Teams include your primary Cardiologist (physician) and Advanced Practice Providers (APPs -  Physician Assistants and Nurse Practitioners) who all work together to provide you with the care you need, when you need it.  We recommend signing up for the patient portal called "MyChart".  Sign up information is provided on this After Visit Summary.  MyChart is used to connect with patients for Virtual Visits (Telemedicine).  Patients are able to view lab/test results, encounter notes, upcoming appointments, etc.  Non-urgent messages can be sent to your provider as  well.   To learn more about what you can do with MyChart, go to [email or web address].    Your next appointment:   6 month(s)  The format for your next appointment:   In Person  Provider:   You may see Thurmon Fair, MD or one of the following Advanced Practice Providers on your designated Care Team:    Azalee Course, PA-C  Micah Flesher, [place] or   Judy Pimple, PA-C       Signed, Thurmon Fair, MD  12/02/2020 8:44 AM    [facility]

## 2020-12-02 NOTE — Patient Instructions (Signed)
Medication Instructions:  No changes *If you need a refill on your cardiac medications before your next appointment, please call your pharmacy*   Lab Work: None ordered If you have labs (blood work) drawn today and your tests are completely normal, you will receive your results only by: Marland Kitchen MyChart Message (if you have MyChart) OR . A paper copy in the mail If you have any lab test that is abnormal or we need to change your treatment, we will call you to review the results.   Testing/Procedures: Your physician has requested that you have an echocardiogram in June 2022. Echocardiography is a painless test that uses sound waves to create images of your heart. It provides your doctor with information about the size and shape of your heart and how well your heart's chambers and valves are working. You may receive an ultrasound enhancing agent through an IV if needed to better visualize your heart during the echo.This procedure takes approximately one hour. There are no restrictions for this procedure. This will take place at the [address], [address].     Follow-Up: At [facility], you and your health needs are our priority.  As part of our continuing mission to provide you with exceptional heart care, we have created designated Provider Care Teams.  These Care Teams include your primary Cardiologist (physician) and Advanced Practice Providers (APPs -  Physician Assistants and Nurse Practitioners) who all work together to provide you with the care you need, when you need it.  We recommend signing up for the patient portal called "MyChart".  Sign up information is provided on this After Visit Summary.  MyChart is used to connect with patients for Virtual Visits (Telemedicine).  Patients are able to view lab/test results, encounter notes, upcoming appointments, etc.  Non-urgent messages can be sent to your provider as well.   To learn more about what you can do with MyChart, go to  [email or web address].    Your next appointment:   6 month(s)  The format for your next appointment:   In Person  Provider:   You may see Thurmon Fair, MD or one of the following Advanced Practice Providers on your designated Care Team:    Azalee Course, PA-C  Micah Flesher, PA-C or   Judy Pimple, [place]

## 2021-01-03 ENCOUNTER — Other Ambulatory Visit: Payer: Self-pay | Admitting: Cardiovascular Disease

## 2021-01-06 ENCOUNTER — Other Ambulatory Visit: Payer: Self-pay | Admitting: Medical

## 2021-02-10 ENCOUNTER — Other Ambulatory Visit: Payer: Self-pay | Admitting: Cardiovascular Disease

## 2021-02-13 IMAGING — DX DG CHEST 2V
2 series · 2 of 2 positions shown · non-contrast
Comparison: 09/03/2019

CLINICAL DATA: Cough

EXAM:
CHEST - 2 VIEW

[chest pa]
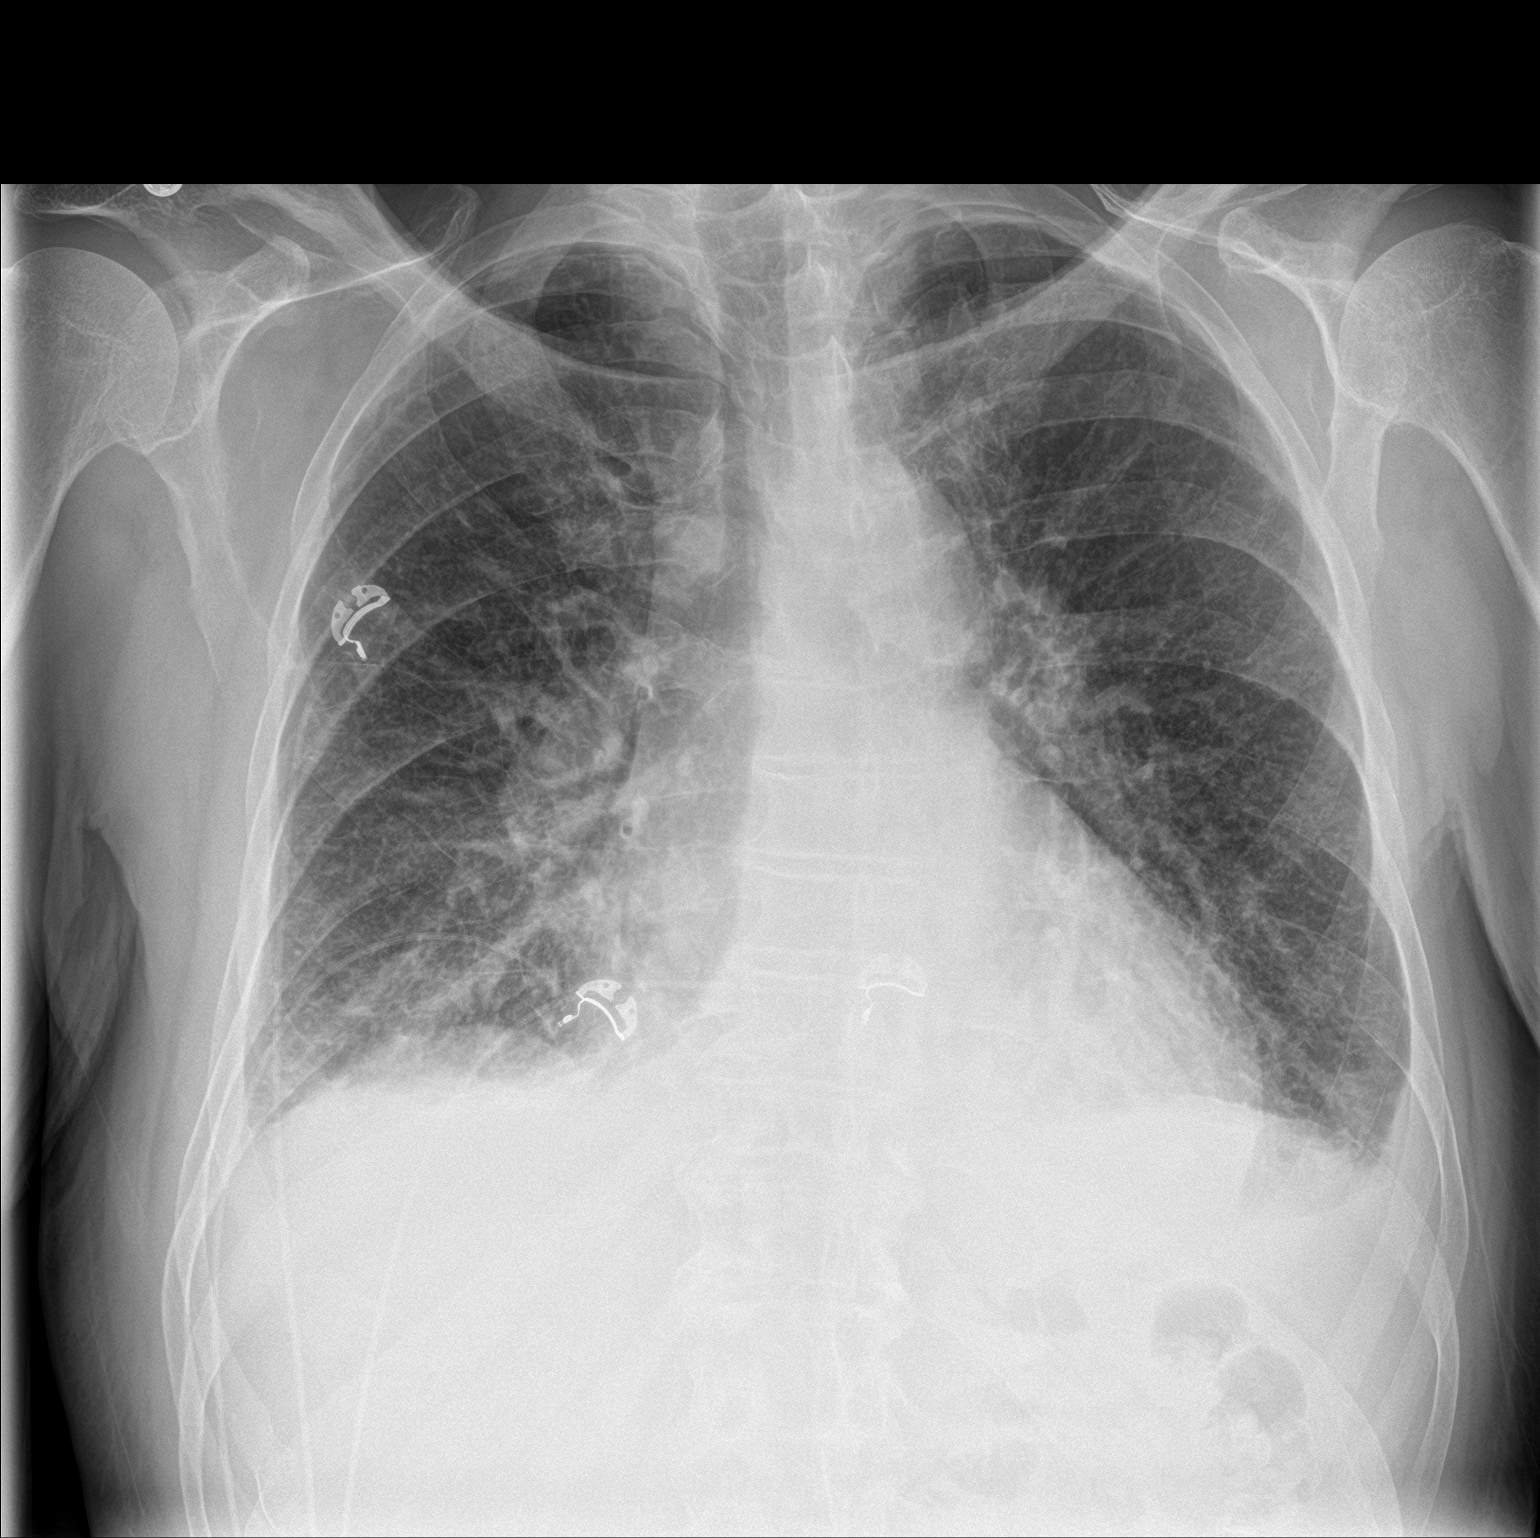

[chest lat]
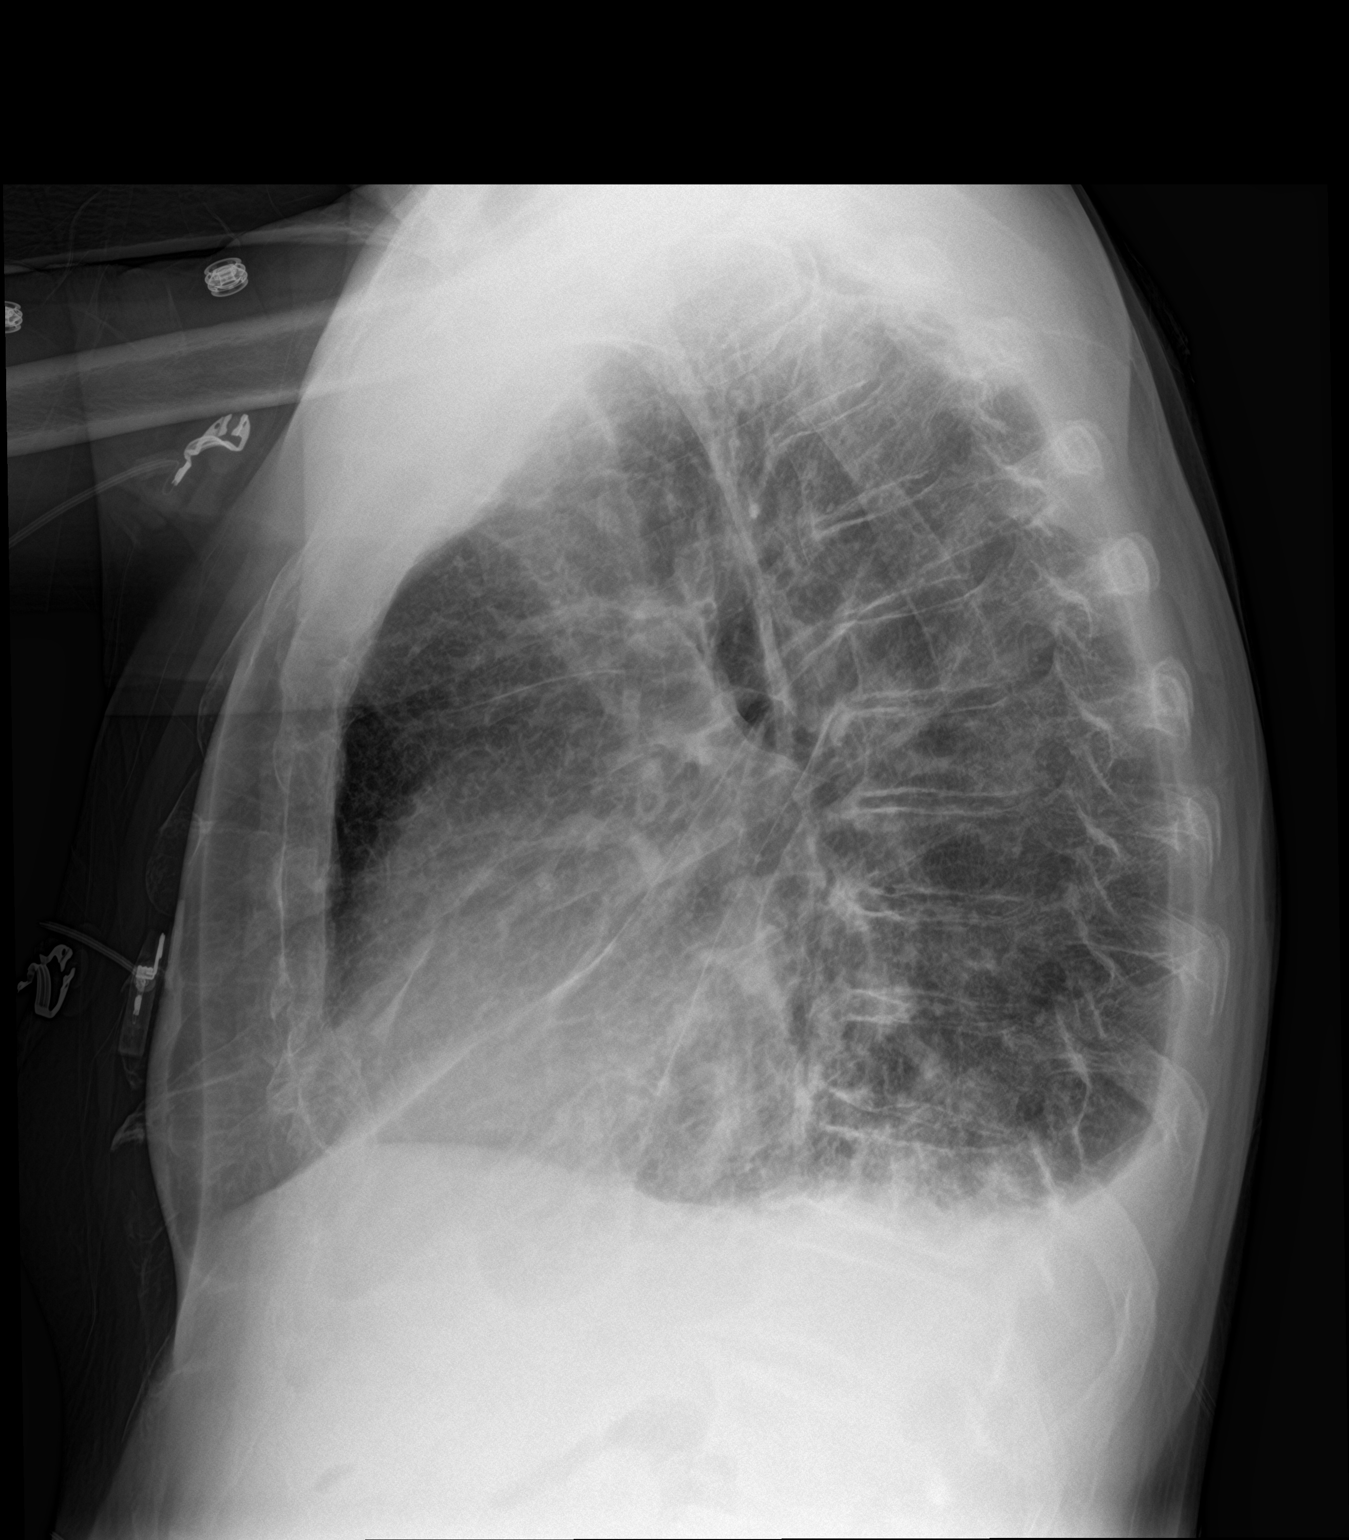

[2 of 2 positions shown; findings below may reference images not displayed]

FINDINGS: Small bilateral pleural effusions. Cardiomegaly with vascular
congestion and interstitial pulmonary edema, increased compared to
prior. Patchy atelectasis at the bases
IMPRESSION: Cardiomegaly with vascular congestion and increased interstitial
pulmonary edema. Small pleural effusions

## 2021-02-14 IMAGING — US US ABDOMEN LIMITED
1 series · 14 of 25 positions shown · non-contrast
Comparison: None.

CLINICAL DATA: Abnormal liver function tests.  Hepatitis.

EXAM:
ULTRASOUND ABDOMEN LIMITED RIGHT UPPER QUADRANT

[Series 1: us abdomen limited · 14 of 53 slices shown]
[im 1/53]
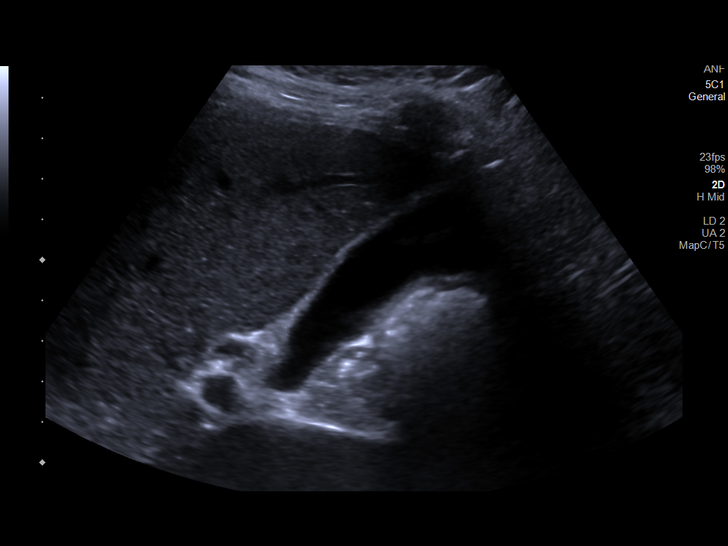
[im 5/53]
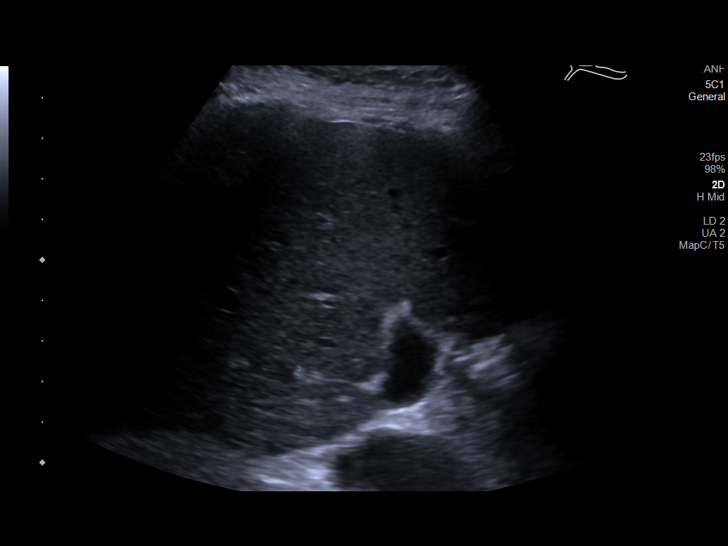
[im 9/53]
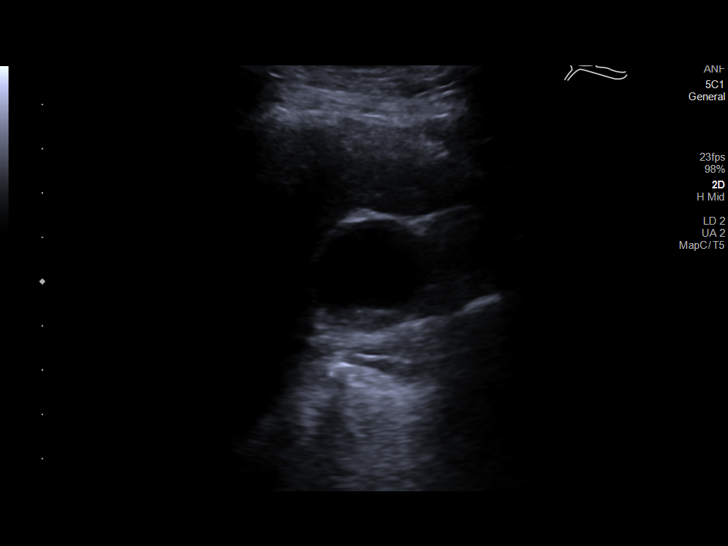
[im 14/53]
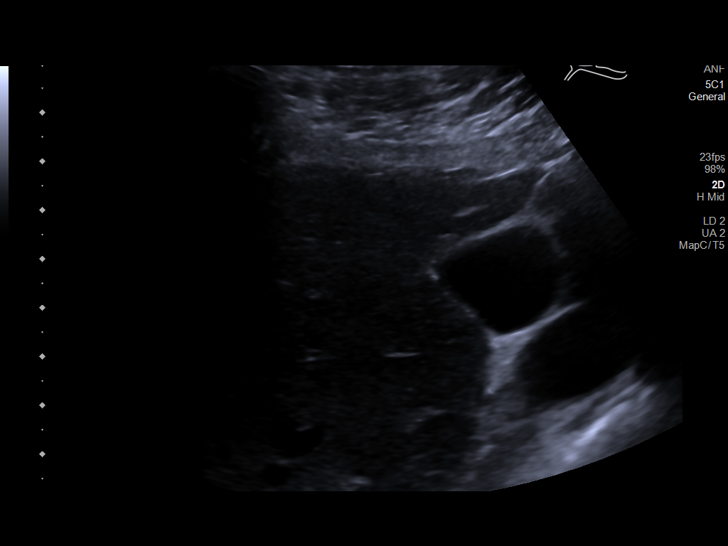
[im 18/53]
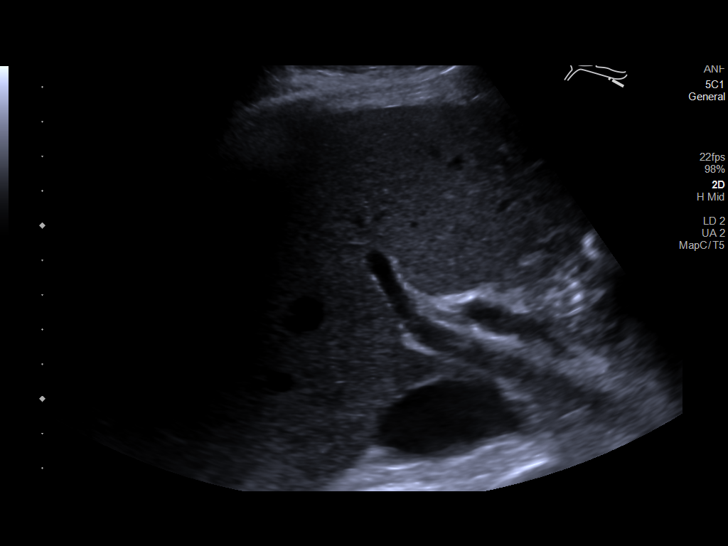
[im 20/53]
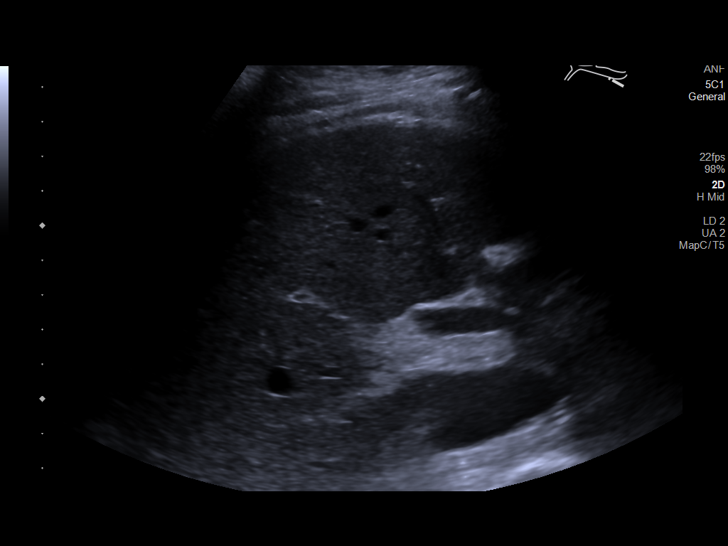
[im 24/53]
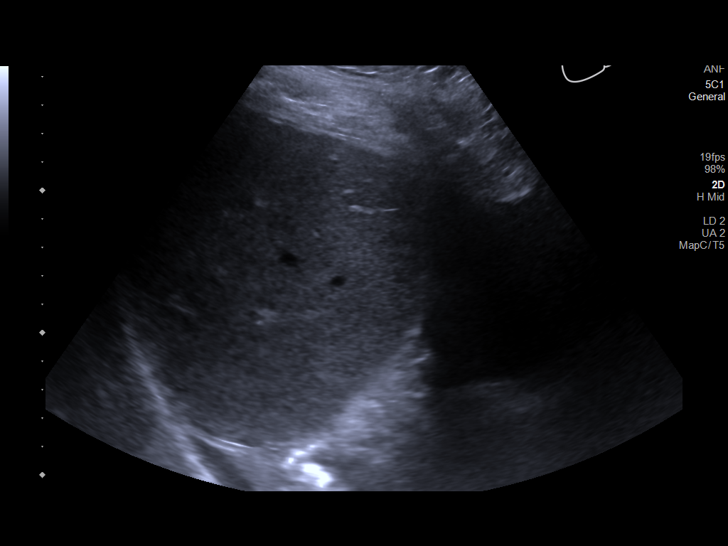
[im 29/53]
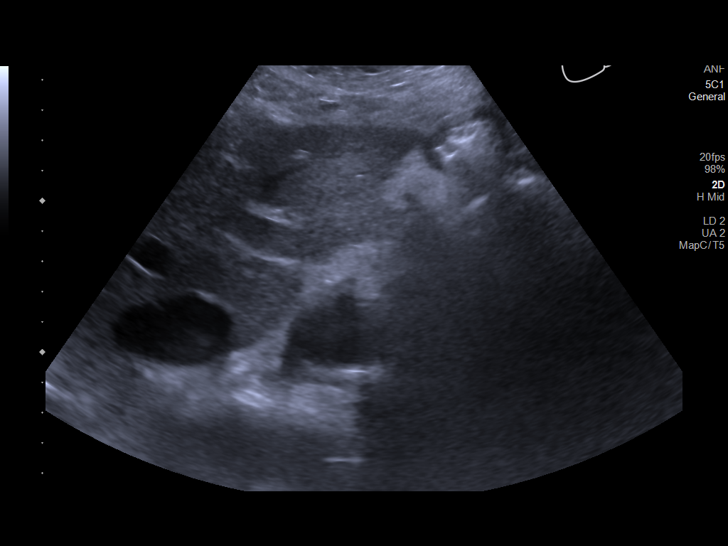
[im 33/53]
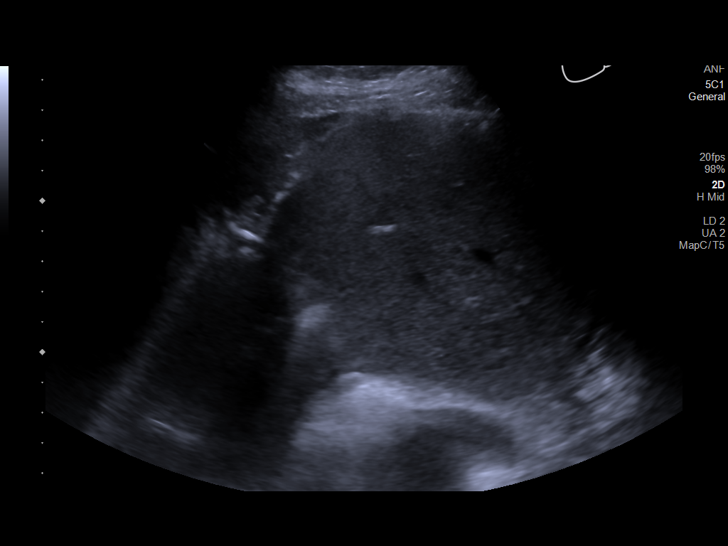
[im 35/53]
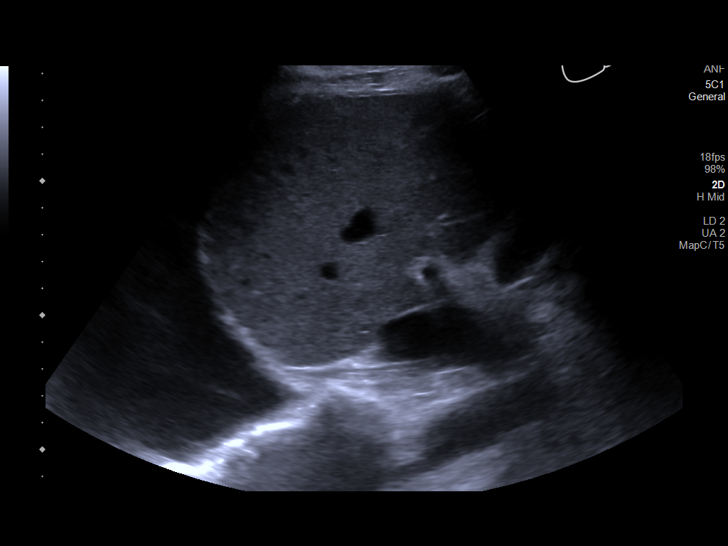
[im 40/53]
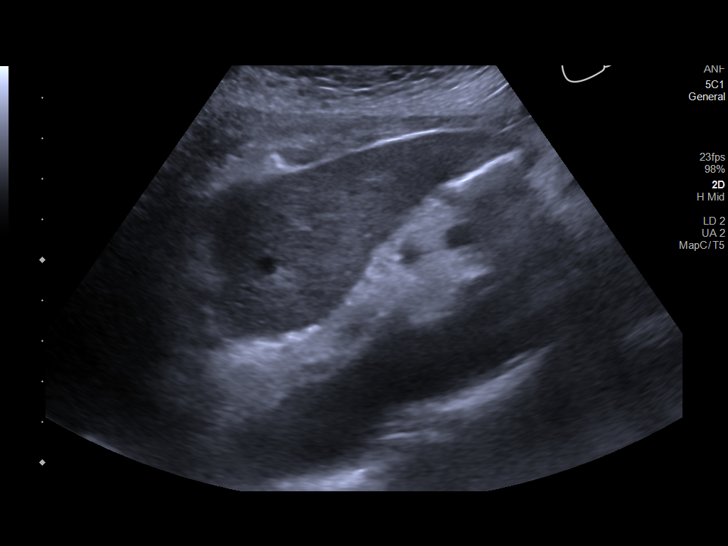
[im 44/53]
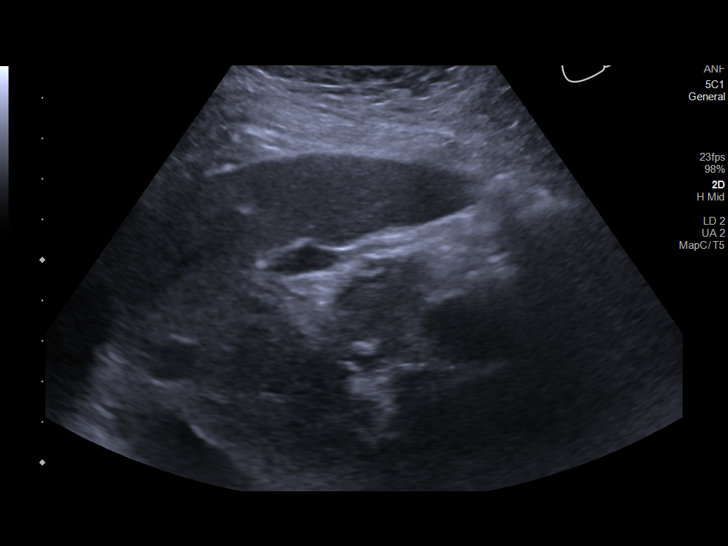
[im 48/53]
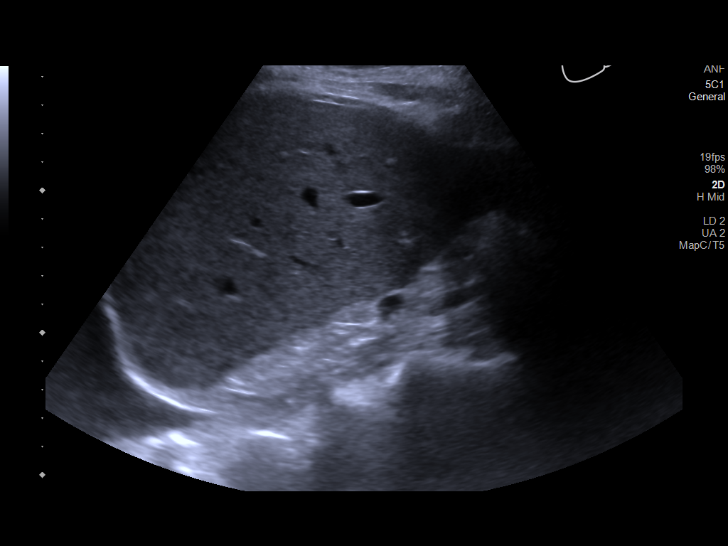
[im 53/53]
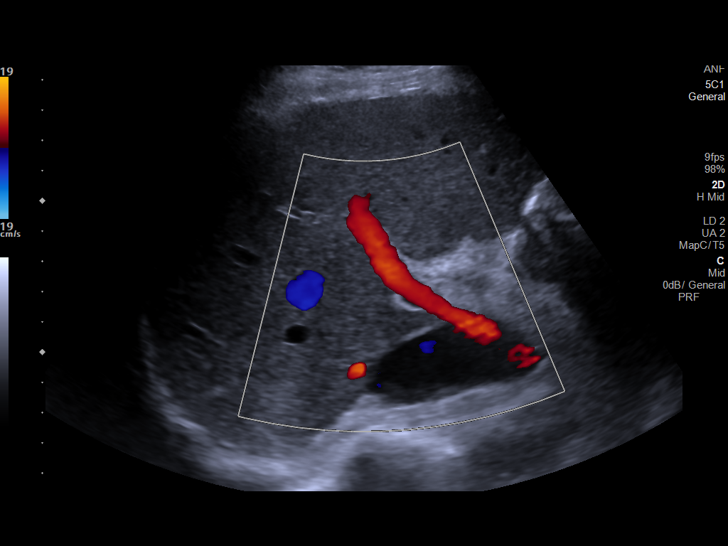

[14 of 25 positions shown; findings below may reference images not displayed]

FINDINGS: Gallbladder:

The patient does appear to have a small amount of gallbladder
sludge. No evidence of shadowing stones. No wall thickening or
surrounding fluid. Negative Murphy sign.

Common bile duct:

Diameter: Dilated at 9.5 mm.  No ductal stone identified.

Liver:

No focal lesion identified. Within normal limits in parenchymal
echogenicity. Portal vein is patent on color Doppler imaging with
normal direction of blood flow towards the liver.

Other: None.
IMPRESSION: Liver parenchyma itself appears normal. There is sludge in the
gallbladder. The common duct is prominent measuring up to 9.5 mm. No
ductal stone is visualized. The patient does not appear to have
intrahepatic ductal dilatation. Therefore, the significance of this
finding is uncertain.

## 2021-03-16 IMAGING — DX DG CHEST 1V PORT
1 series · 2 of 2 positions shown · non-contrast
Comparison: 09/05/2019.

CLINICAL DATA: Abnormal heart rate after change in medication.

EXAM:
PORTABLE CHEST 1 VIEW

[Series 1: chest ap · 0.14mm/px · 2 of 2 slices shown]
[im 1/2]
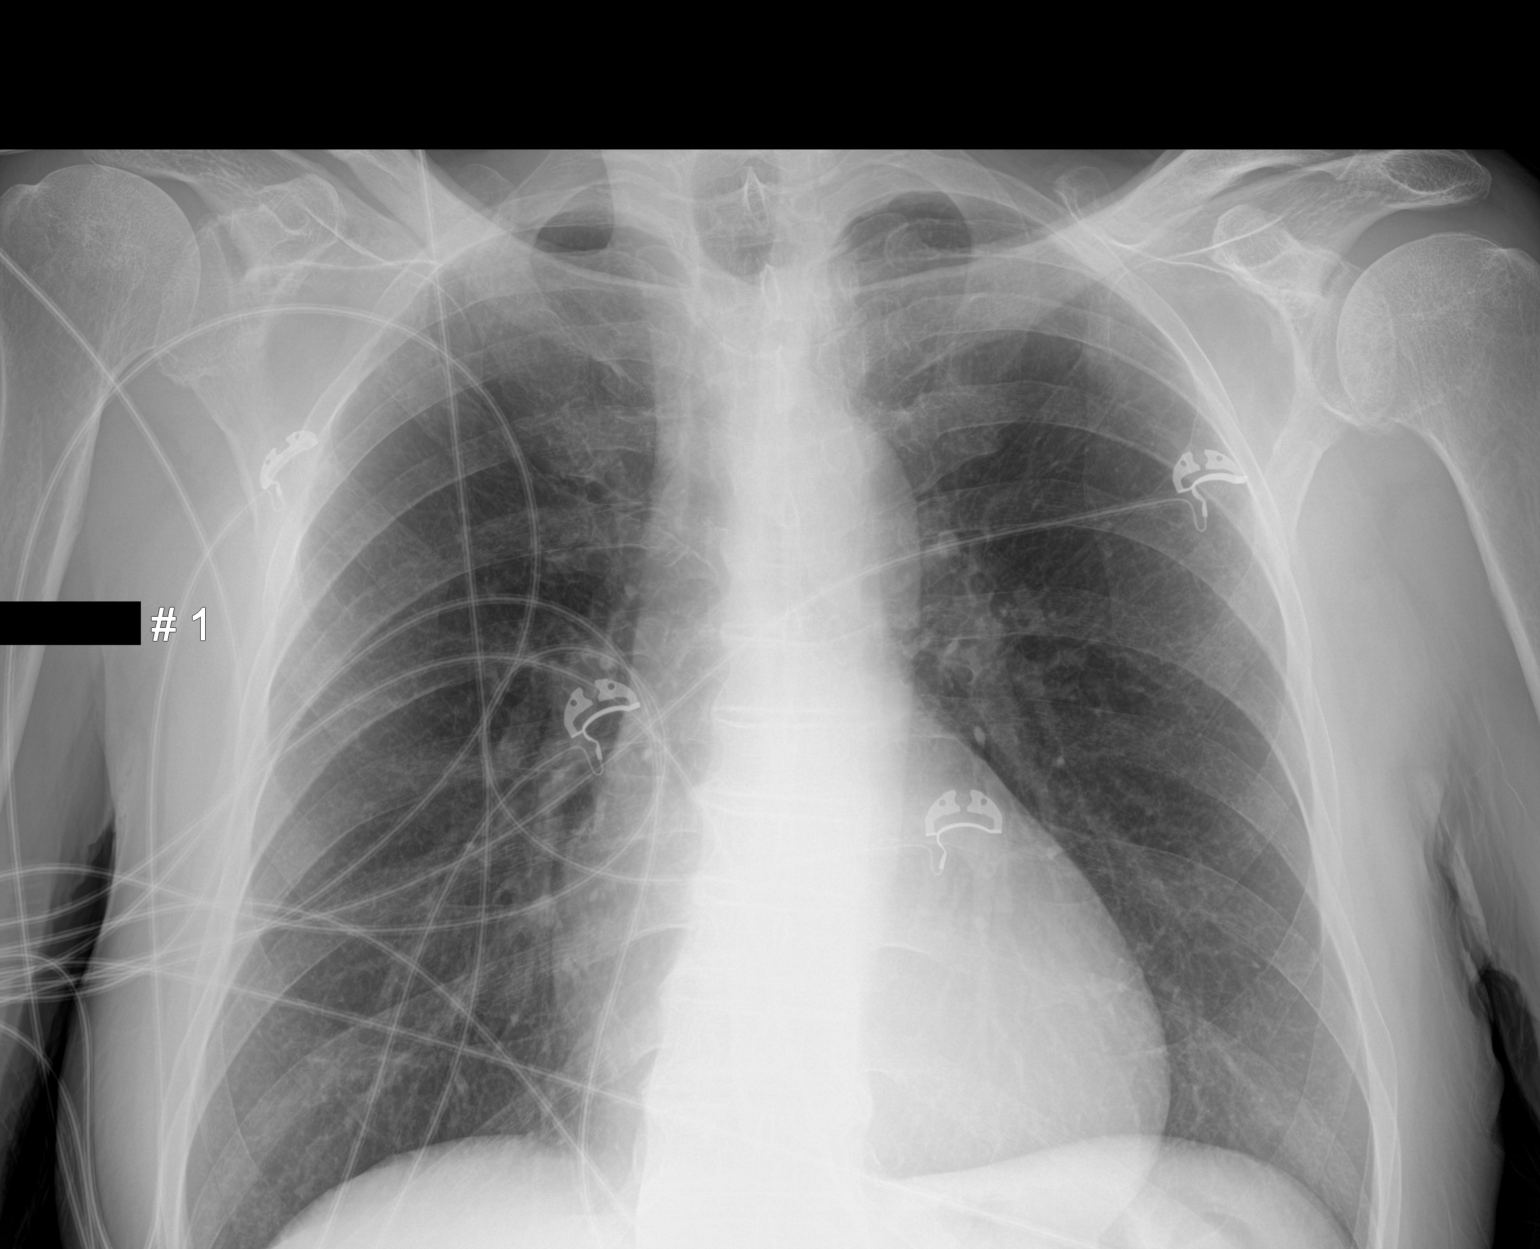
[im 2/2]
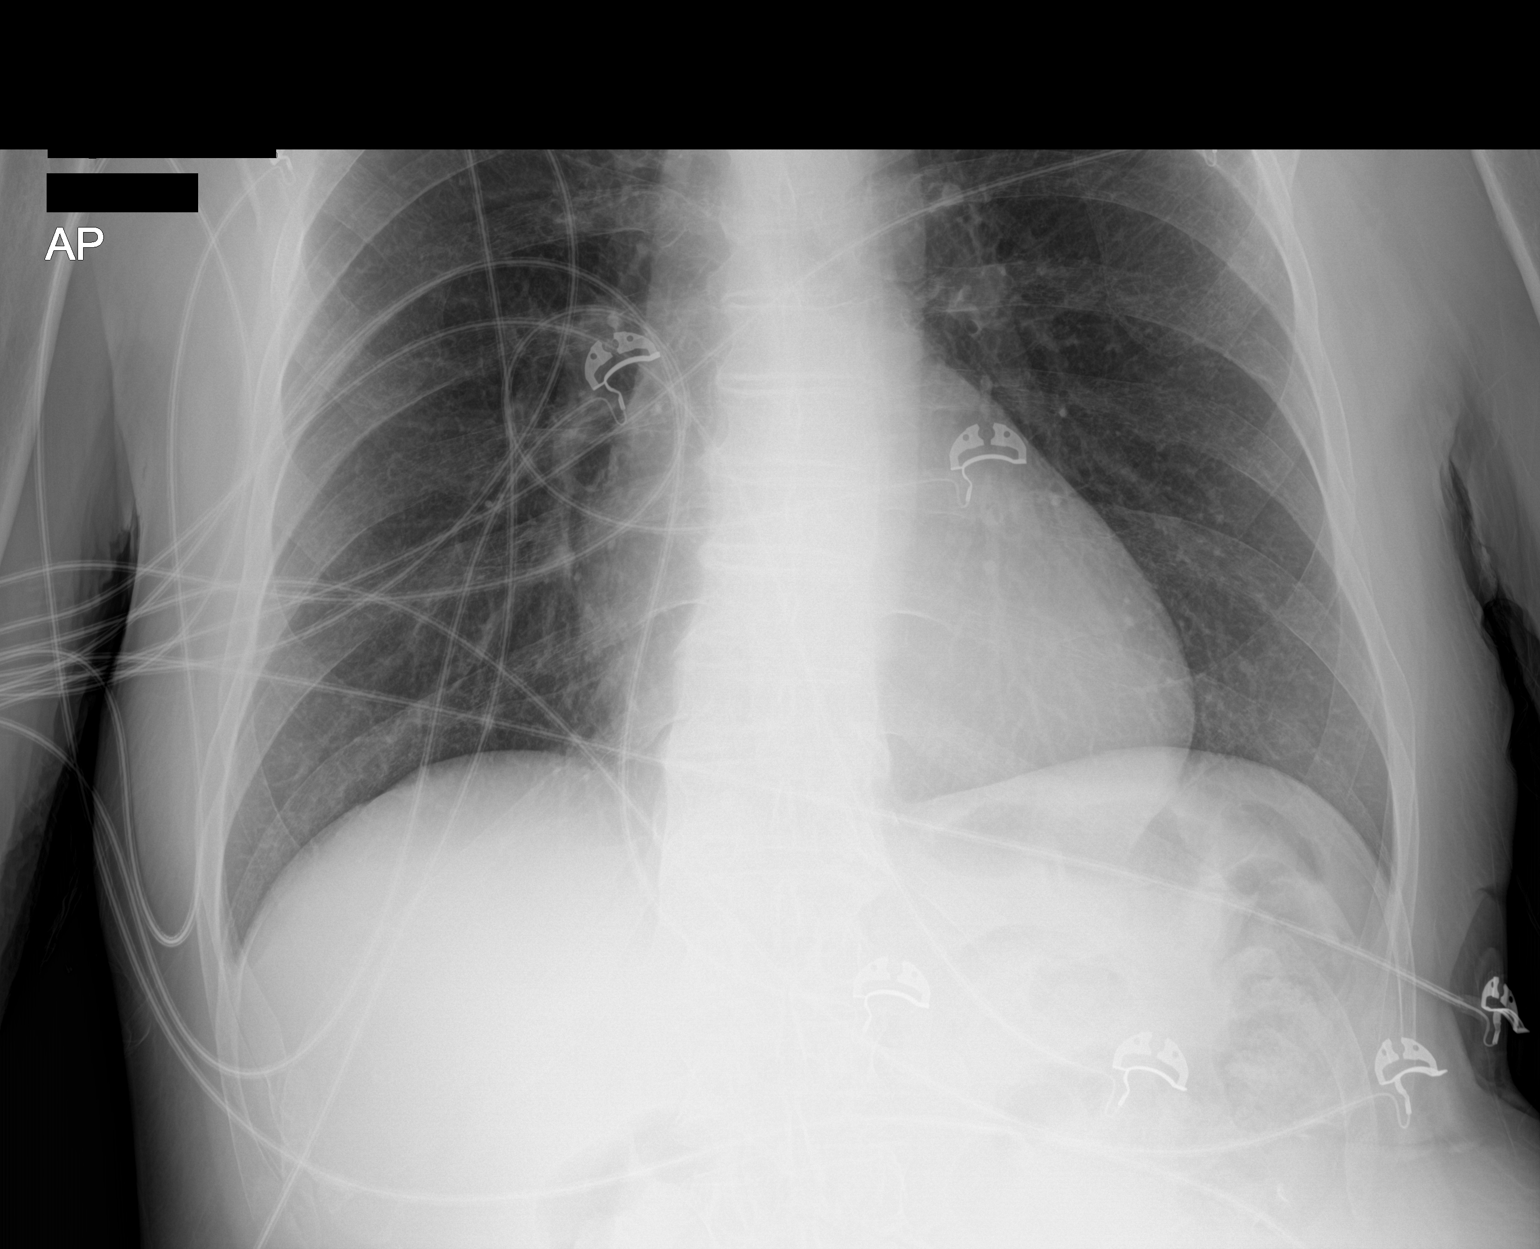

[2 of 2 positions shown; findings below may reference images not displayed]

FINDINGS: Trachea is midline. Heart size stable. Lungs are clear. No pleural
fluid.
IMPRESSION: No acute findings.

## 2021-03-28 ENCOUNTER — Other Ambulatory Visit: Payer: Self-pay | Admitting: Cardiovascular Disease

## 2021-03-29 NOTE — Telephone Encounter (Signed)
49m, 71.9kg, scr 1.3 04/30/20, lovw/croitoru 12/02/20

## 2021-05-02 ENCOUNTER — Ambulatory Visit (HOSPITAL_COMMUNITY): Payer: Medicare HMO | Attending: Cardiovascular Disease

## 2021-05-02 ENCOUNTER — Other Ambulatory Visit: Payer: Self-pay

## 2021-05-02 DIAGNOSIS — I5022 Chronic systolic (congestive) heart failure: Secondary | ICD-10-CM | POA: Diagnosis not present

## 2021-05-02 LAB — ECHOCARDIOGRAM COMPLETE
MV M vel: 4.8 m/s
MV Peak grad: 92.2 mmHg
Radius: 0.4 cm
S' Lateral: 5 cm

## 2021-05-04 ENCOUNTER — Telehealth: Payer: Self-pay | Admitting: Cardiovascular Disease

## 2021-05-04 NOTE — Telephone Encounter (Signed)
Pt updated with ECHO results along with MD's recommendations. Please see results note.

## 2021-05-04 NOTE — Telephone Encounter (Signed)
Pt called in and stated he was returning Parke Poisson call about his echo.  He stated he did see his results on mychart.  Just fyi   Best number [phone]

## 2021-06-03 ENCOUNTER — Other Ambulatory Visit: Payer: Self-pay

## 2021-06-03 ENCOUNTER — Encounter: Payer: Self-pay | Admitting: Cardiovascular Disease

## 2021-06-03 ENCOUNTER — Ambulatory Visit: Payer: Medicare HMO | Admitting: Cardiovascular Disease

## 2021-06-03 VITALS — BP 120/80 | HR 100 | Ht 74.5 in | Wt 154.2 lb

## 2021-06-03 DIAGNOSIS — N1831 Chronic kidney disease, stage 3a: Secondary | ICD-10-CM

## 2021-06-03 DIAGNOSIS — I5022 Chronic systolic (congestive) heart failure: Secondary | ICD-10-CM | POA: Diagnosis not present

## 2021-06-03 DIAGNOSIS — Z7901 Long term (current) use of anticoagulants: Secondary | ICD-10-CM | POA: Diagnosis not present

## 2021-06-03 DIAGNOSIS — I4819 Other persistent atrial fibrillation: Secondary | ICD-10-CM | POA: Diagnosis not present

## 2021-06-03 MED ORDER — FUROSEMIDE 20 MG PO TABS: 20.0000 mg | ORAL_TABLET | Freq: Every day | ORAL | 1 refills | Status: DC | PRN

## 2021-06-03 MED ORDER — CARVEDILOL 25 MG PO TABS: 37.5000 mg | ORAL_TABLET | Freq: Two times a day (BID) | ORAL | 3 refills | Status: DC

## 2021-06-03 NOTE — Progress Notes (Signed)
Cardiology Office Note:    Date:  06/03/2021   ID:  Michael Noble, DOB [birth year], MRN [id number]  PCP:  Patient, No Pcp Per (Inactive)  [facility] Cardiologist:  Thurmon Fair, MD  [facility] Electrophysiologist:  None   Referring MD: No ref. provider found   Chief Complaint  Patient presents with   Congestive Heart Failure   Atrial Fibrillation     History of Present Illness:    Michael Noble is a 70 y.o. adult with a hx of HFrEF and persistent AFIb, presenting with RVR and LVEF 21% in October 2020.  Symptom onset was insidious.  He has never had angina.  His nuclear perfusion study showed normal myocardial perfusion.  Following initiation of heart failure therapy he has had a progressive improvement in left ventricular systolic function with  LVEF 35-40% by echocardiogram 04/30/2020, but with a repeat drop in EF to 25-30% by echo in 05/02/2021.  I went back and reviewed the images from June 13.  This study was interpreted showing "restrictive filling", but the patient was actually in atrial fibrillation with a ventricular rate of 100 bpm at the time, therefore diastolic function cannot be interpreted.  This raises the likelihood that he has been in persistent atrial fibrillation with poor ventricular rate control and that is why his EF has dropped again  Despite the drop in EF he has not had overt heart failure.  He denies orthopnea, PND or lower extremity edema.  He has difficulty working for prolonged periods of time in the outside heat, but otherwise really no change in his symptoms.  He has been under more stress than usual since he is helping both his grandchildren relocate to a new job in [place] and to college respectively.    He is in atrial fibrillation today and rate control is mediocre with a resting heart rate of 100.  This is his first episode of recurrent atrial fibrillation that we have documented since he stopped amiodarone therapy last year.  He is  compliant with anticoagulation and has not had any bleeding problems or neurological events.  We have tried to increase his heart failure medications, but when placed on losartan 50 mg daily he developed severe symptomatic hypotension (80/50).  He feels well on the 25 mg dose.  Unable to tolerate Entresto.  He is on high-dose carvedilol.  Initial attempts to start treatment with angiotensin receptor blocker were unsuccessful due to worsening renal parameters (creatinine increased from 1.3-1.94 and he had borderline hyperkalemia) and symptomatic hypotension.  Most recent creatinine was 1.3 on 04/30/2020 (improved).  Maximum tolerated dose of losartan was 25 mg daily.  He has an excellent LDL cholesterol at 52 but has a low HDL 23 (last checked October 2020)  Past Medical History:  Diagnosis Date   Arthritis    Hepatitis    hx of 35 years ago     Past Surgical History:  Procedure Laterality Date   TOTAL HIP ARTHROPLASTY Left 06/25/2015   Procedure: LEFT TOTAL HIP ARTHROPLASTY ANTERIOR APPROACH;  Surgeon: Kathryne Hitch, MD;  Location: [facility];  Service: Orthopedics;  Laterality: Left;    Current Medications: Current Meds  Medication Sig   ELIQUIS 5 MG TABS tablet Take 1 tablet by mouth twice daily   furosemide (LASIX) 20 MG tablet Take 1 tablet (20 mg total) by mouth daily as needed for edema.   losartan (COZAAR) 25 MG tablet Take 1 tablet by mouth once daily   Lysine 500  MG TABS Take 500 mg by mouth daily.   Misc Natural Products (URINOZINC PO) Take 1 tablet by mouth daily.   Multiple Vitamin (MULTIVITAMIN WITH MINERALS) TABS tablet Take 1 tablet by mouth daily. 50 +   Multiple Vitamins-Minerals (EQ VISION FORMULA 50+ PO) Take 1 tablet by mouth daily.   Turmeric 400 MG CAPS Take 400 mg by mouth daily.    [DISCONTINUED] carvedilol (COREG) 25 MG tablet TAKE 1 TABLET BY MOUTH TWICE DAILY WITH MEALS     Allergies:   Patient has no known allergies.   Social History    Socioeconomic History   Marital status: Married    Spouse name: Not on file   Number of children: Not on file   Years of education: Not on file   Highest education level: Not on file  Occupational History   Not on file  Tobacco Use   Smoking status: Former   Smokeless tobacco: Former    Types: Chew  Substance and Sexual Activity   Alcohol use: No   Drug use: No   Sexual activity: Not on file  Other Topics Concern   Not on file  Social History Narrative   Not on file   Social Determinants of Health   Financial Resource Strain: Not on file  Food Insecurity: Not on file  Transportation Needs: Not on file  Physical Activity: Not on file  Stress: Not on file  Social Connections: Not on file     Family History: The patient's family history includes Aneurysm in his father; Atrial fibrillation in his father; Heart disease in his mother.  ROS:   Please see the history of present illness.     All other systems reviewed and are negative.  EKGs/Labs/Other Studies Reviewed:    The following studies were reviewed today: Echocardiogram 05/02/2021  1. Left ventricular ejection fraction, by estimation, is 25 to 30%. The  left ventricle has severely decreased function. The left ventricle  demonstrates global hypokinesis. The left ventricular internal cavity size  was mildly dilated. Left ventricular  diastolic parameters are consistent with Grade III diastolic dysfunction  (restrictive).   2. Right ventricular systolic function is normal. The right ventricular  size is normal.   3. The mitral valve is normal in structure. Moderate mitral valve  regurgitation.   4. The aortic valve is normal in structure. Aortic valve regurgitation is  trivial. No aortic stenosis is present.   Comparison(s): 01/29/20 EF 35-40%.   EKG: EKG is ordered today and shows atrial fibrillation with borderline ventricular rate control at 100 bpm and occasional aberrantly conducted beats.  QTc 451  ms.  Recent Labs: No results found for requested labs within last 8760 hours.  Recent Lipid Panel    Component Value Date/Time   CHOL 83 09/04/2019 0223   TRIG 41 09/04/2019 0223   HDL 23 (L) 09/04/2019 0223   CHOLHDL 3.6 09/04/2019 0223   VLDL 8 09/04/2019 0223   LDLCALC 52 09/04/2019 0223    Physical Exam:    VS:  BP 120/80 (BP Location: Left Arm)   Pulse 100   Ht 6' 2.5" (1.892 m)   Wt 154 lb 3.2 oz (69.9 kg)   BMI 19.53 kg/m     Wt Readings from Last 3 Encounters:  06/03/21 154 lb 3.2 oz (69.9 kg)  12/02/20 158 lb 9.6 oz (71.9 kg)  09/01/20 159 lb 9.6 oz (72.4 kg)       General: Alert, oriented x3, no distress, lean, but fit  Head: no evidence of trauma, PERRL, EOMI, no exophtalmos or lid lag, no myxedema, no xanthelasma; normal ears, nose and oropharynx Neck: normal jugular venous pulsations and no hepatojugular reflux; brisk carotid pulses without delay and no carotid bruits Chest: clear to auscultation, no signs of consolidation by percussion or palpation, normal fremitus, symmetrical and full respiratory excursions Cardiovascular: normal position and quality of the apical impulse, regular rhythm, normal first and second heart sounds, no murmurs, rubs or gallops Abdomen: no tenderness or distention, no masses by palpation, no abnormal pulsatility or arterial bruits, normal bowel sounds, no hepatosplenomegaly Extremities: no clubbing, cyanosis or edema; 2+ radial, ulnar and brachial pulses bilaterally; 2+ right femoral, posterior tibial and dorsalis pedis pulses; 2+ left femoral, posterior tibial and dorsalis pedis pulses; no subclavian or femoral bruits Neurological: grossly nonfocal Psych: Normal mood and affect     ASSESSMENT:    1. Chronic systolic heart failure (HCC)   2. Persistent atrial fibrillation (HCC)   3. Long term (current) use of anticoagulants   4. Stage 3a chronic kidney disease (HCC)     PLAN:    In order of problems listed above:  CHF:  He remains NYHA functional class I and appears clinically euvolemic despite the fact that he is not taking any diuretics.  Most recent echocardiogram shows a drop in ejection fraction, but this appears to be due to recurrent atrial fibrillation with poor ventricular rate control.  Suspect this is been going on for more than a month now.  It is apparent that he has underlying cardiomyopathy not caused by the atrial fibrillation, with periods of exacerbation/worsening LV dysfunction due to the atrial fibrillation.  Rate control is therefore very important.  He was unable to tolerate Entresto or higher doses of ARB.  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  There is no family history of genetic myopathy or heart failure.  His treatment would not be directly impacted up with make this diagnosis, but it could have implications for his personal and family prognosis.  I have previously offered referral for genetic testing, but he did not express any interest in this.  We will also give him a supply of low-dose furosemide tablets to use as needed if he develops weight gain/edema/shortness of breath. AFib: Maintain normal rhythm for about a year after we stopped the amiodarone.  Now back in atrial fibrillation and again oblivious to the arrhythmia.  Rather than restarting amiodarone, I think we should consider dofetilide or ablation.  I will increase the dose of carvedilol to 37.5 mg twice a day.  Compliant with Eliquis.  CHA2DS2-VASc 2 (age, HF). Anticoagulation: Denies bleeding complications and has not missed any doses of medication. CKD 3a: Need to recheck his creatinine.  Most recent creatinine was 1.3 corresponding to moderate CKD, GFR around 55.  This should not preclude the use of dofetilide.  Will attempt to get him an  appointment in the A. fib clinic in the next week or 2 to discuss cardioversion, dofetilide versus ablation, etc.   Medication Adjustments/Labs and Tests Ordered: Current medicines are reviewed at length with the patient today.  Concerns regarding medicines are outlined above.  Orders Placed This Encounter  Procedures   EKG 12-Lead    Meds ordered this encounter  Medications   carvedilol (COREG) 25  MG tablet    Sig: Take 1.5 tablets (37.5 mg total) by mouth 2 (two) times daily with a meal.    Dispense:  270 tablet    Refill:  3   furosemide (LASIX) 20 MG tablet    Sig: Take 1 tablet (20 mg total) by mouth daily as needed for edema.    Dispense:  30 tablet    Refill:  1     Patient Instructions  Medication Instructions:  CARVEDILOL: Take 25 mg twice daily. When your heart rate is over 90 beats per minute, take 37.5 mg (tablet and a half)  FUROSEMIDE: take one 20 mg tablet as needed for swelling  *If you need a refill on your cardiac medications before your next appointment, please call your pharmacy*   Lab Work: None ordered If you have labs (blood work) drawn today and your tests are completely normal, you will receive your results only by: MyChart Message (if you have MyChart) OR A paper copy in the mail If you have any lab test that is abnormal or we need to change your treatment, we will call you to review the results.   Testing/Procedures: None ordered   Follow-Up: At [facility], you and your health needs are our priority.  As part of our continuing mission to provide you with exceptional heart care, we have created designated Provider Care Teams.  These Care Teams include your primary Cardiologist (physician) and Advanced Practice Providers (APPs -  Physician Assistants and Nurse Practitioners) who all work together to provide you with the care you need, when you need it.  We recommend signing up for the patient portal called "MyChart".  Sign up information is  provided on this After Visit Summary.  MyChart is used to connect with patients for Virtual Visits (Telemedicine).  Patients are able to view lab/test results, encounter notes, upcoming appointments, etc.  Non-urgent messages can be sent to your provider as well.   To learn more about what you can do with MyChart, go to [email or web address].    Your next appointment:   6 month(s)  The format for your next appointment:   In Person  Provider:   You may see Thurmon Fair, MD or one of the following Advanced Practice Providers on your designated Care Team:   Azalee Course, PA-C Micah Flesher, [place] or  Judy Pimple, PA-C    Signed, Thurmon Fair, MD  06/03/2021 11:48 AM    [facility]

## 2021-06-03 NOTE — Patient Instructions (Signed)
Medication Instructions:  CARVEDILOL: Take 25 mg twice daily. When your heart rate is over 90 beats per minute, take 37.5 mg (tablet and a half)  FUROSEMIDE: take one 20 mg tablet as needed for swelling  *If you need a refill on your cardiac medications before your next appointment, please call your pharmacy*   Lab Work: None ordered If you have labs (blood work) drawn today and your tests are completely normal, you will receive your results only by: MyChart Message (if you have MyChart) OR A paper copy in the mail If you have any lab test that is abnormal or we need to change your treatment, we will call you to review the results.   Testing/Procedures: None ordered   Follow-Up: At [facility], you and your health needs are our priority.  As part of our continuing mission to provide you with exceptional heart care, we have created designated Provider Care Teams.  These Care Teams include your primary Cardiologist (physician) and Advanced Practice Providers (APPs -  Physician Assistants and Nurse Practitioners) who all work together to provide you with the care you need, when you need it.  We recommend signing up for the patient portal called "MyChart".  Sign up information is provided on this After Visit Summary.  MyChart is used to connect with patients for Virtual Visits (Telemedicine).  Patients are able to view lab/test results, encounter notes, upcoming appointments, etc.  Non-urgent messages can be sent to your provider as well.   To learn more about what you can do with MyChart, go to [email or web address].    Your next appointment:   6 month(s)  The format for your next appointment:   In Person  Provider:   You may see Thurmon Fair, MD or one of the following Advanced Practice Providers on your designated Care Team:   Azalee Course, PA-C Micah Flesher, PA-C or  Judy Pimple, [place]

## 2021-06-03 NOTE — H&P (View-Only) (Signed)
Cardiology Office Note:    Date:  06/03/2021   ID:  Michael Noble, DOB 09/08/1951, MRN [id number]  PCP:  Patient, No Pcp Per (Inactive)  [facility] Cardiologist:  Brinae Woods, MD  [facility] Electrophysiologist:  None   Referring MD: No ref. provider found   Chief Complaint  Patient presents with   Congestive Heart Failure   Atrial Fibrillation     History of Present Illness:    Michael Noble is a 70 y.o. adult with a hx of HFrEF and persistent AFIb, presenting with RVR and LVEF 21% in October 2020.  Symptom onset was insidious.  He has never had angina.  His nuclear perfusion study showed normal myocardial perfusion.  Following initiation of heart failure therapy he has had a progressive improvement in left ventricular systolic function with  LVEF 35-40% by echocardiogram 04/30/2020, but with a repeat drop in EF to 25-30% by echo in 05/02/2021.  I went back and reviewed the images from June 13.  This study was interpreted showing "restrictive filling", but the patient was actually in atrial fibrillation with a ventricular rate of 100 bpm at the time, therefore diastolic function cannot be interpreted.  This raises the likelihood that he has been in persistent atrial fibrillation with poor ventricular rate control and that is why his EF has dropped again  Despite the drop in EF he has not had overt heart failure.  He denies orthopnea, PND or lower extremity edema.  He has difficulty working for prolonged periods of time in the outside heat, but otherwise really no change in his symptoms.  He has been under more stress than usual since he is helping both his grandchildren relocate to a new job in [place] and to college respectively.    He is in atrial fibrillation today and rate control is mediocre with a resting heart rate of 100.  This is his first episode of recurrent atrial fibrillation that we have documented since he stopped amiodarone therapy last year.  He is  compliant with anticoagulation and has not had any bleeding problems or neurological events.  We have tried to increase his heart failure medications, but when placed on losartan 50 mg daily he developed severe symptomatic hypotension (80/50).  He feels well on the 25 mg dose.  Unable to tolerate Entresto.  He is on high-dose carvedilol.  Initial attempts to start treatment with angiotensin receptor blocker were unsuccessful due to worsening renal parameters (creatinine increased from 1.3-1.94 and he had borderline hyperkalemia) and symptomatic hypotension.  Most recent creatinine was 1.3 on 04/30/2020 (improved).  Maximum tolerated dose of losartan was 25 mg daily.  He has an excellent LDL cholesterol at 52 but has a low HDL 23 (last checked October 2020)  Past Medical History:  Diagnosis Date   Arthritis    Hepatitis    hx of 35 years ago     Past Surgical History:  Procedure Laterality Date   TOTAL HIP ARTHROPLASTY Left 06/25/2015   Procedure: LEFT TOTAL HIP ARTHROPLASTY ANTERIOR APPROACH;  Surgeon: Christopher Y Blackman, MD;  Location: [facility];  Service: Orthopedics;  Laterality: Left;    Current Medications: Current Meds  Medication Sig   ELIQUIS 5 MG TABS tablet Take 1 tablet by mouth twice daily   furosemide (LASIX) 20 MG tablet Take 1 tablet (20 mg total) by mouth daily as needed for edema.   losartan (COZAAR) 25 MG tablet Take 1 tablet by mouth once daily   Lysine 500   MG TABS Take 500 mg by mouth daily.   Misc Natural Products (URINOZINC PO) Take 1 tablet by mouth daily.   Multiple Vitamin (MULTIVITAMIN WITH MINERALS) TABS tablet Take 1 tablet by mouth daily. 50 +   Multiple Vitamins-Minerals (EQ VISION FORMULA 50+ PO) Take 1 tablet by mouth daily.   Turmeric 400 MG CAPS Take 400 mg by mouth daily.    [DISCONTINUED] carvedilol (COREG) 25 MG tablet TAKE 1 TABLET BY MOUTH TWICE DAILY WITH MEALS     Allergies:   Patient has no known allergies.   Social History    Socioeconomic History   Marital status: Married    Spouse name: Not on file   Number of children: Not on file   Years of education: Not on file   Highest education level: Not on file  Occupational History   Not on file  Tobacco Use   Smoking status: Former   Smokeless tobacco: Former    Types: Chew  Substance and Sexual Activity   Alcohol use: No   Drug use: No   Sexual activity: Not on file  Other Topics Concern   Not on file  Social History Narrative   Not on file   Social Determinants of Health   Financial Resource Strain: Not on file  Food Insecurity: Not on file  Transportation Needs: Not on file  Physical Activity: Not on file  Stress: Not on file  Social Connections: Not on file     Family History: The patient's family history includes Aneurysm in his father; Atrial fibrillation in his father; Heart disease in his mother.  ROS:   Please see the history of present illness.     All other systems reviewed and are negative.  EKGs/Labs/Other Studies Reviewed:    The following studies were reviewed today: Echocardiogram 05/02/2021  1. Left ventricular ejection fraction, by estimation, is 25 to 30%. The  left ventricle has severely decreased function. The left ventricle  demonstrates global hypokinesis. The left ventricular internal cavity size  was mildly dilated. Left ventricular  diastolic parameters are consistent with Grade III diastolic dysfunction  (restrictive).   2. Right ventricular systolic function is normal. The right ventricular  size is normal.   3. The mitral valve is normal in structure. Moderate mitral valve  regurgitation.   4. The aortic valve is normal in structure. Aortic valve regurgitation is  trivial. No aortic stenosis is present.   Comparison(s): 01/29/20 EF 35-40%.   EKG: EKG is ordered today and shows atrial fibrillation with borderline ventricular rate control at 100 bpm and occasional aberrantly conducted beats.  QTc 451  ms.  Recent Labs: No results found for requested labs within last 8760 hours.  Recent Lipid Panel    Component Value Date/Time   CHOL 83 09/04/2019 0223   TRIG 41 09/04/2019 0223   HDL 23 (L) 09/04/2019 0223   CHOLHDL 3.6 09/04/2019 0223   VLDL 8 09/04/2019 0223   LDLCALC 52 09/04/2019 0223    Physical Exam:    VS:  BP 120/80 (BP Location: Left Arm)   Pulse 100   Ht 6' 2.5" (1.892 m)   Wt 154 lb 3.2 oz (69.9 kg)   BMI 19.53 kg/m     Wt Readings from Last 3 Encounters:  06/03/21 154 lb 3.2 oz (69.9 kg)  12/02/20 158 lb 9.6 oz (71.9 kg)  09/01/20 159 lb 9.6 oz (72.4 kg)       General: Alert, oriented x3, no distress, lean, but fit  Head: no evidence of trauma, PERRL, EOMI, no exophtalmos or lid lag, no myxedema, no xanthelasma; normal ears, nose and oropharynx Neck: normal jugular venous pulsations and no hepatojugular reflux; brisk carotid pulses without delay and no carotid bruits Chest: clear to auscultation, no signs of consolidation by percussion or palpation, normal fremitus, symmetrical and full respiratory excursions Cardiovascular: normal position and quality of the apical impulse, regular rhythm, normal first and second heart sounds, no murmurs, rubs or gallops Abdomen: no tenderness or distention, no masses by palpation, no abnormal pulsatility or arterial bruits, normal bowel sounds, no hepatosplenomegaly Extremities: no clubbing, cyanosis or edema; 2+ radial, ulnar and brachial pulses bilaterally; 2+ right femoral, posterior tibial and dorsalis pedis pulses; 2+ left femoral, posterior tibial and dorsalis pedis pulses; no subclavian or femoral bruits Neurological: grossly nonfocal Psych: Normal mood and affect     ASSESSMENT:    1. Chronic systolic heart failure (HCC)   2. Persistent atrial fibrillation (HCC)   3. Long term (current) use of anticoagulants   4. Stage 3a chronic kidney disease (HCC)     PLAN:    In order of problems listed above:  CHF:  He remains NYHA functional class I and appears clinically euvolemic despite the fact that he is not taking any diuretics.  Most recent echocardiogram shows a drop in ejection fraction, but this appears to be due to recurrent atrial fibrillation with poor ventricular rate control.  Suspect this is been going on for more than a month now.  It is apparent that he has underlying cardiomyopathy not caused by the atrial fibrillation, with periods of exacerbation/worsening LV dysfunction due to the atrial fibrillation.  Rate control is therefore very important.  He was unable to tolerate Entresto or higher doses of ARB.  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  There is no family history of genetic myopathy or heart failure.  His treatment would not be directly impacted up with make this diagnosis, but it could have implications for his personal and family prognosis.  I have previously offered referral for genetic testing, but he did not express any interest in this.  We will also give him a supply of low-dose furosemide tablets to use as needed if he develops weight gain/edema/shortness of breath. AFib: Maintain normal rhythm for about a year after we stopped the amiodarone.  Now back in atrial fibrillation and again oblivious to the arrhythmia.  Rather than restarting amiodarone, I think we should consider dofetilide or ablation.  I will increase the dose of carvedilol to 37.5 mg twice a day.  Compliant with Eliquis.  CHA2DS2-VASc 2 (age, HF). Anticoagulation: Denies bleeding complications and has not missed any doses of medication. CKD 3a: Need to recheck his creatinine.  Most recent creatinine was 1.3 corresponding to moderate CKD, GFR around 55.  This should not preclude the use of dofetilide.  Will attempt to get him an  appointment in the A. fib clinic in the next week or 2 to discuss cardioversion, dofetilide versus ablation, etc.   Medication Adjustments/Labs and Tests Ordered: Current medicines are reviewed at length with the patient today.  Concerns regarding medicines are outlined above.  Orders Placed This Encounter  Procedures   EKG 12-Lead    Meds ordered this encounter  Medications   carvedilol (COREG) 25  MG tablet    Sig: Take 1.5 tablets (37.5 mg total) by mouth 2 (two) times daily with a meal.    Dispense:  270 tablet    Refill:  3   furosemide (LASIX) 20 MG tablet    Sig: Take 1 tablet (20 mg total) by mouth daily as needed for edema.    Dispense:  30 tablet    Refill:  1     Patient Instructions  Medication Instructions:  CARVEDILOL: Take 25 mg twice daily. When your heart rate is over 90 beats per minute, take 37.5 mg (tablet and a half)  FUROSEMIDE: take one 20 mg tablet as needed for swelling  *If you need a refill on your cardiac medications before your next appointment, please call your pharmacy*   Lab Work: None ordered If you have labs (blood work) drawn today and your tests are completely normal, you will receive your results only by: MyChart Message (if you have MyChart) OR A paper copy in the mail If you have any lab test that is abnormal or we need to change your treatment, we will call you to review the results.   Testing/Procedures: None ordered   Follow-Up: At [facility], you and your health needs are our priority.  As part of our continuing mission to provide you with exceptional heart care, we have created designated Provider Care Teams.  These Care Teams include your primary Cardiologist (physician) and Advanced Practice Providers (APPs -  Physician Assistants and Nurse Practitioners) who all work together to provide you with the care you need, when you need it.  We recommend signing up for the patient portal called "MyChart".  Sign up information is  provided on this After Visit Summary.  MyChart is used to connect with patients for Virtual Visits (Telemedicine).  Patients are able to view lab/test results, encounter notes, upcoming appointments, etc.  Non-urgent messages can be sent to your provider as well.   To learn more about what you can do with MyChart, go to [email or web address].    Your next appointment:   6 month(s)  The format for your next appointment:   In Person  Provider:   You may see Thurmon Fair, MD or one of the following Advanced Practice Providers on your designated Care Team:   Azalee Course, PA-C Micah Flesher, [place] or  Judy Pimple, PA-C    Signed, Thurmon Fair, MD  06/03/2021 11:48 AM    [facility]

## 2021-06-06 DIAGNOSIS — Z7901 Long term (current) use of anticoagulants: Secondary | ICD-10-CM

## 2021-06-06 DIAGNOSIS — I4819 Other persistent atrial fibrillation: Secondary | ICD-10-CM

## 2021-06-07 NOTE — Addendum Note (Signed)
Addended by: Sandi Mariscal on: 06/07/2021 09:14 AM   Modules accepted: Orders

## 2021-06-13 DIAGNOSIS — I4819 Other persistent atrial fibrillation: Secondary | ICD-10-CM | POA: Diagnosis not present

## 2021-06-13 DIAGNOSIS — Z7901 Long term (current) use of anticoagulants: Secondary | ICD-10-CM | POA: Diagnosis not present

## 2021-06-14 LAB — CBC
Hematocrit: 37.6 % (ref 37.5–51.0)
Hemoglobin: 14 g/dL (ref 13.0–17.7)
MCH: 35 pg — ABNORMAL HIGH (ref 26.6–33.0)
MCHC: 37.2 g/dL — ABNORMAL HIGH (ref 31.5–35.7)
MCV: 94 fL (ref 79–97)
Platelets: 145 10*3/uL — ABNORMAL LOW (ref 150–450)
RBC: 4 x10E6/uL — ABNORMAL LOW (ref 4.14–5.80)
RDW: 13 % (ref 11.6–15.4)
WBC: 4.4 10*3/uL (ref 3.4–10.8)

## 2021-06-14 LAB — BASIC METABOLIC PANEL
BUN/Creatinine Ratio: 27 — ABNORMAL HIGH (ref 10–24)
BUN: 34 mg/dL — ABNORMAL HIGH (ref 8–27)
CO2: 24 mmol/L (ref 20–29)
Calcium: 9.7 mg/dL (ref 8.6–10.2)
Chloride: 104 mmol/L (ref 96–106)
Creatinine, Ser: 1.26 mg/dL (ref 0.76–1.27)
Glucose: 93 mg/dL (ref 65–99)
Potassium: 5.1 mmol/L (ref 3.5–5.2)
Sodium: 140 mmol/L (ref 134–144)
eGFR: 62 mL/min/{1.73_m2} (ref >59)

## 2021-06-16 ENCOUNTER — Encounter (HOSPITAL_COMMUNITY): Payer: Self-pay | Admitting: Cardiovascular Disease

## 2021-06-16 ENCOUNTER — Ambulatory Visit (HOSPITAL_COMMUNITY)
Admission: RE | Admit: 2021-06-16 | Discharge: 2021-06-16 | Disposition: A | Discharge status: Home / Self Care | Payer: Medicare HMO | Attending: Cardiovascular Disease | Admitting: Cardiovascular Disease

## 2021-06-16 ENCOUNTER — Other Ambulatory Visit: Payer: Self-pay

## 2021-06-16 ENCOUNTER — Ambulatory Visit (HOSPITAL_COMMUNITY): Payer: Medicare HMO | Admitting: Certified Registered Nurse Anesthetist

## 2021-06-16 ENCOUNTER — Encounter (HOSPITAL_COMMUNITY)
Admission: RE | Disposition: A | Discharge status: Home / Self Care | Payer: Self-pay | Source: Home / Self Care | Attending: Cardiovascular Disease

## 2021-06-16 DIAGNOSIS — I509 Heart failure, unspecified: Secondary | ICD-10-CM | POA: Diagnosis not present

## 2021-06-16 DIAGNOSIS — Z7901 Long term (current) use of anticoagulants: Secondary | ICD-10-CM | POA: Insufficient documentation

## 2021-06-16 DIAGNOSIS — Z79899 Other long term (current) drug therapy: Secondary | ICD-10-CM | POA: Insufficient documentation

## 2021-06-16 DIAGNOSIS — Z87891 Personal history of nicotine dependence: Secondary | ICD-10-CM | POA: Diagnosis not present

## 2021-06-16 DIAGNOSIS — I5022 Chronic systolic (congestive) heart failure: Secondary | ICD-10-CM | POA: Insufficient documentation

## 2021-06-16 DIAGNOSIS — N179 Acute kidney failure, unspecified: Secondary | ICD-10-CM | POA: Diagnosis not present

## 2021-06-16 DIAGNOSIS — N1831 Chronic kidney disease, stage 3a: Secondary | ICD-10-CM | POA: Diagnosis not present

## 2021-06-16 DIAGNOSIS — I4819 Other persistent atrial fibrillation: Secondary | ICD-10-CM | POA: Diagnosis not present

## 2021-06-16 HISTORY — PX: CARDIOVERSION: SHX1299

## 2021-06-16 SURGERY — CARDIOVERSION
Anesthesia: General

## 2021-06-16 MED ORDER — LIDOCAINE 2% (20 MG/ML) 5 ML SYRINGE
INTRAMUSCULAR | Status: DC | PRN
  Administered 2021-06-16: 60 mg via INTRAVENOUS

## 2021-06-16 MED ORDER — PROPOFOL 10 MG/ML IV BOLUS
INTRAVENOUS | Status: DC | PRN
  Administered 2021-06-16: 20 mg via INTRAVENOUS
  Administered 2021-06-16: 60 mg via INTRAVENOUS

## 2021-06-16 MED ORDER — SODIUM CHLORIDE 0.9 % IV SOLN: INTRAVENOUS | Status: DC | PRN

## 2021-06-16 MED ORDER — SODIUM CHLORIDE 0.9 % IV SOLN: INTRAVENOUS | Status: DC

## 2021-06-16 NOTE — Anesthesia Preprocedure Evaluation (Signed)
Anesthesia Evaluation  Patient identified by MRN, date of birth, ID band Patient awake    Reviewed: Allergy & Precautions, H&P , NPO status , Patient's Chart, lab work & pertinent test results  Airway Mallampati: II   Neck ROM: full    Dental   Pulmonary former smoker,    breath sounds clear to auscultation       Cardiovascular +CHF  + dysrhythmias Atrial Fibrillation  Rhythm:irregular Rate:Normal     Neuro/Psych    GI/Hepatic   Endo/Other    Renal/GU      Musculoskeletal  (+) Arthritis ,   Abdominal   Peds  Hematology   Anesthesia Other Findings   Reproductive/Obstetrics                             Anesthesia Physical Anesthesia Plan  ASA: 3  Anesthesia Plan: General   Post-op Pain Management:    Induction: Intravenous  PONV Risk Score and Plan: 2 and Propofol infusion and Treatment may vary due to age or medical condition  Airway Management Planned: Mask  Additional Equipment:   Intra-op Plan:   Post-operative Plan:   Informed Consent: I have reviewed the patients History and Physical, chart, labs and discussed the procedure including the risks, benefits and alternatives for the proposed anesthesia with the patient or authorized representative who has indicated his/her understanding and acceptance.     Dental advisory given  Plan Discussed with: CRNA, Anesthesiologist and Surgeon  Anesthesia Plan Comments:         Anesthesia Quick Evaluation

## 2021-06-16 NOTE — Transfer of Care (Signed)
Immediate Anesthesia Transfer of Care Note  Patient: Michael Noble  Procedure(s) Performed: CARDIOVERSION  Patient Location: Endoscopy Unit  Anesthesia Type:General  Level of Consciousness: drowsy and patient cooperative  Airway & Oxygen Therapy: Patient Spontanous Breathing and Patient connected to face mask oxygen  Post-op Assessment: Report given to RN and Post -op Vital signs reviewed and stable  Post vital signs: Reviewed and stable  Last Vitals:  Vitals Value Taken Time  BP 99/69   Temp    Pulse 99   Resp 13   SpO2 100     Last Pain:  Vitals:   06/16/21 0820  TempSrc: Oral  PainSc: 0-No pain         Complications: No notable events documented.

## 2021-06-16 NOTE — Interval H&P Note (Signed)
History and Physical Interval Note:  06/16/2021 8:17 AM  Michael Noble  has presented today for surgery, with the diagnosis of AFIB.  The various methods of treatment have been discussed with the patient and family. After consideration of risks, benefits and other options for treatment, the patient has consented to  Procedure(s): CARDIOVERSION (N/A) as a surgical intervention.  The patient's history has been reviewed, patient examined, no change in status, stable for surgery.  I have reviewed the patient's chart and labs.  Questions were answered to the patient's satisfaction.     Chilton Si, MD

## 2021-06-16 NOTE — Anesthesia Procedure Notes (Signed)
Procedure Name: General with mask airway Date/Time: 06/16/2021 8:42 AM Performed by: Modena Morrow, CRNA Pre-anesthesia Checklist: Patient identified, Emergency Drugs available, Suction available and Patient being monitored Patient Re-evaluated:Patient Re-evaluated prior to induction Oxygen Delivery Method: Simple face mask Preoxygenation: Pre-oxygenation with 100% oxygen Induction Type: IV induction Dental Injury: Teeth and Oropharynx as per pre-operative assessment

## 2021-06-16 NOTE — CV Procedure (Signed)
Electrical Cardioversion Procedure Note Michael Noble [id number] [birth year]  Procedure: Electrical Cardioversion Indications:  Atrial Fibrillation  Procedure Details Consent: Risks of procedure as well as the alternatives and risks of each were explained to the (patient/caregiver).  Consent for procedure obtained. Time Out: Verified patient identification, verified procedure, site/side was marked, verified correct patient position, special equipment/implants available, medications/allergies/relevent history reviewed, required imaging and test results available.  Performed  Patient placed on cardiac monitor, pulse oximetry, supplemental oxygen as necessary.  Sedation given:  propofol Pacer pads placed anterior and posterior chest.  Cardioverted 2 time(s).  Cardioverted at 150J unsuccessful.  200J successful.  Evaluation Findings: Post procedure EKG shows:  Sinus rhythm with PACs and PVCs Complications: None Patient did tolerate procedure well.   Chilton Si, MD 06/16/2021, 8:50 AM

## 2021-06-17 NOTE — Anesthesia Postprocedure Evaluation (Signed)
Anesthesia Post Note  Patient: Michael Noble  Procedure(s) Performed: CARDIOVERSION     Patient location during evaluation: Endoscopy Anesthesia Type: General Level of consciousness: awake and alert Pain management: pain level controlled Vital Signs Assessment: post-procedure vital signs reviewed and stable Respiratory status: spontaneous breathing, nonlabored ventilation, respiratory function stable and patient connected to nasal cannula oxygen Cardiovascular status: blood pressure returned to baseline and stable Postop Assessment: no apparent nausea or vomiting Anesthetic complications: no   No notable events documented.  Last Vitals:  Vitals:   06/16/21 0910 06/16/21 0920  BP: 99/67 107/61  Pulse:    Resp:    Temp:    SpO2:      Last Pain:  Vitals:   06/17/21 0927  TempSrc:   PainSc: 0-No pain                 Sakai Wolford S

## 2021-06-21 ENCOUNTER — Other Ambulatory Visit: Payer: Self-pay | Admitting: *Deleted

## 2021-06-21 DIAGNOSIS — I4819 Other persistent atrial fibrillation: Secondary | ICD-10-CM

## 2021-06-27 ENCOUNTER — Other Ambulatory Visit: Payer: Self-pay

## 2021-06-27 ENCOUNTER — Ambulatory Visit (HOSPITAL_COMMUNITY)
Admission: RE | Admit: 2021-06-27 | Discharge: 2021-06-27 | Disposition: A | Discharge status: Home / Self Care | Payer: Medicare HMO | Source: Ambulatory Visit | Attending: Physician Assistant | Admitting: Physician Assistant

## 2021-06-27 VITALS — BP 118/84 | HR 104 | Ht 74.5 in | Wt 154.8 lb

## 2021-06-27 DIAGNOSIS — I4819 Other persistent atrial fibrillation: Secondary | ICD-10-CM | POA: Diagnosis not present

## 2021-06-27 DIAGNOSIS — D6869 Other thrombophilia: Secondary | ICD-10-CM | POA: Insufficient documentation

## 2021-06-27 DIAGNOSIS — Z7901 Long term (current) use of anticoagulants: Secondary | ICD-10-CM | POA: Insufficient documentation

## 2021-06-27 DIAGNOSIS — Z79899 Other long term (current) drug therapy: Secondary | ICD-10-CM | POA: Diagnosis not present

## 2021-06-27 DIAGNOSIS — I5022 Chronic systolic (congestive) heart failure: Secondary | ICD-10-CM | POA: Diagnosis not present

## 2021-06-27 DIAGNOSIS — N189 Chronic kidney disease, unspecified: Secondary | ICD-10-CM | POA: Diagnosis not present

## 2021-06-27 DIAGNOSIS — Z87891 Personal history of nicotine dependence: Secondary | ICD-10-CM | POA: Diagnosis not present

## 2021-06-27 DIAGNOSIS — Z8249 Family history of ischemic heart disease and other diseases of the circulatory system: Secondary | ICD-10-CM | POA: Diagnosis not present

## 2021-06-27 MED ORDER — LOSARTAN POTASSIUM 25 MG PO TABS: 12.5000 mg | ORAL_TABLET | Freq: Every day | ORAL | Status: DC

## 2021-06-27 NOTE — Progress Notes (Signed)
Primary Care Physician: Patient, No Pcp Per (Inactive) Primary Cardiologist: Dr Royann Shivers Primary Electrophysiologist: Dr Elberta Fortis (new) Referring Physician: Dr Comer Locket is a 70 y.o. adult with a history of HFrEF, atrial fibrillation, CKD who presents for consultation in the [facility].  The patient was initially diagnosed with atrial fibrillation 08/2019. He was hospitalized with acute CHF and afib with RVR. He was started on amiodarone which converted him to SR. He had done well as an outpatient and his amiodarone was eventually discontinued. Patient is on Eliquis for a CHADS2VASC score of 2. He was found to be back in afib at follow up with Dr Royann Shivers on 06/03/21. He underwent DCCV on 06/16/21 but was back in afib about 2 days later, per his smart watch. He is in afib today but asymptomatic.   Today, he denies symptoms of palpitations, chest pain, shortness of breath, orthopnea, PND, lower extremity edema, dizziness, presyncope, syncope, snoring, daytime somnolence, bleeding, or neurologic sequela. The patient is tolerating medications without difficulties and is otherwise without complaint today.    Atrial Fibrillation Risk Factors:  he does not have symptoms or diagnosis of sleep apnea. he does not have a history of rheumatic fever. he does not have a history of alcohol use.   he has a BMI of Body mass index is 19.61 kg/m.Marland Kitchen Filed Weights   06/27/21 1019  Weight: 70.2 kg    Family History  Problem Relation Age of Onset   Heart disease Mother    Atrial fibrillation Father    Aneurysm Father      Atrial Fibrillation Management history:  Previous antiarrhythmic drugs: amiodarone  Previous cardioversions: 06/16/21 Previous ablations: none CHADS2VASC score: 2 Anticoagulation history: Eliquis   Past Medical History:  Diagnosis Date   Arthritis    Hepatitis    hx of 35 years ago    Past Surgical History:  Procedure  Laterality Date   CARDIOVERSION N/A 06/16/2021   Procedure: CARDIOVERSION;  Surgeon: Chilton Si, MD;  Location: [facility] ENDOSCOPY;  Service: Cardiovascular;  Laterality: N/A;   TOTAL HIP ARTHROPLASTY Left 06/25/2015   Procedure: LEFT TOTAL HIP ARTHROPLASTY ANTERIOR APPROACH;  Surgeon: Kathryne Hitch, MD;  Location: [facility] ORS;  Service: Orthopedics;  Laterality: Left;    Current Outpatient Medications  Medication Sig Dispense Refill   carvedilol (COREG) 25 MG tablet Take 1.5 tablets (37.5 mg total) by mouth 2 (two) times daily with a meal. 270 tablet 3   ELIQUIS 5 MG TABS tablet Take 1 tablet by mouth twice daily 180 tablet 1   furosemide (LASIX) 20 MG tablet Take 1 tablet (20 mg total) by mouth daily as needed for edema. 30 tablet 1   Lysine 500 MG TABS Take 500 mg by mouth daily.     Misc Natural Products (URINOZINC PO) Take 1 tablet by mouth daily.     Multiple Vitamin (MULTIVITAMIN WITH MINERALS) TABS tablet Take 1 tablet by mouth daily. 50 +     Multiple Vitamins-Minerals (EQ VISION FORMULA 50+ PO) Take 1 tablet by mouth daily.     Turmeric 400 MG CAPS Take 400 mg by mouth daily. With flax seed oil and fish oil combination     losartan (COZAAR) 25 MG tablet Take 0.5 tablets (12.5 mg total) by mouth daily.     No current facility-administered medications for this encounter.    No Known Allergies  Social History   Socioeconomic History   Marital status: Married  Spouse name: Not on file   Number of children: Not on file   Years of education: Not on file   Highest education level: Not on file  Occupational History   Not on file  Tobacco Use   Smoking status: Former   Smokeless tobacco: Former    Types: Chew  Substance and Sexual Activity   Alcohol use: No   Drug use: No   Sexual activity: Not on file  Other Topics Concern   Not on file  Social History Narrative   Not on file   Social Determinants of Health   Financial Resource Strain: Not on file  Food  Insecurity: Not on file  Transportation Needs: Not on file  Physical Activity: Not on file  Stress: Not on file  Social Connections: Not on file  Intimate Partner Violence: Not on file     ROS- All systems are reviewed and negative except as per the HPI above.  Physical Exam: Vitals:   06/27/21 1019  BP: 118/84  Pulse: (!) 104  Weight: 70.2 kg  Height: 6' 2.5" (1.892 m)    GEN- The patient is a well appearing male, alert and oriented x 3 today.   Head- normocephalic, atraumatic Eyes-  Sclera clear, conjunctiva pink Ears- hearing intact Oropharynx- clear Neck- supple  Lungs- Clear to ausculation bilaterally, normal work of breathing Heart- irregular rate and rhythm, no murmurs, rubs or gallops  GI- soft, NT, ND, + BS Extremities- no clubbing, cyanosis, or edema MS- no significant deformity or atrophy Skin- no rash or lesion Psych- euthymic mood, full affect Neuro- strength and sensation are intact  Wt Readings from Last 3 Encounters:  06/27/21 70.2 kg  06/16/21 69.9 kg  06/03/21 69.9 kg    EKG today demonstrates  Afib, PVC Vent. rate 104 BPM PR interval * ms QRS duration 100 ms QT/QTcB 346/454 ms  Echo 05/02/21 demonstrated  1. Left ventricular ejection fraction, by estimation, is 25 to 30%. The  left ventricle has severely decreased function. The left ventricle  demonstrates global hypokinesis. The left ventricular internal cavity size was mildly dilated. Left ventricular diastolic parameters are consistent with Grade III diastolic dysfunction (restrictive).   2. Right ventricular systolic function is normal. The right ventricular  size is normal.   3. The mitral valve is normal in structure. Moderate mitral valve  regurgitation.   4. The aortic valve is normal in structure. Aortic valve regurgitation is  trivial. No aortic stenosis is present.   Comparison(s): 01/29/20 EF 35-40%.   Epic records are reviewed at length today  CHA2DS2-VASc Score = 2  The  patient's score is based upon: CHF History: Yes HTN History: No Diabetes History: No Stroke History: No Vascular Disease History: No Age Score: 1 Gender Score: 0     ASSESSMENT AND PLAN: 1. Persistent Atrial Fibrillation (ICD10:  I48.19) The patient's CHA2DS2-VASc score is 2, indicating a 2.2% annual risk of stroke.   S/p DCCV 06/16/21 with early return of afib. We discussed therapeutic options today including AAD (dofetilide) vs ablation. Patient would like to avoid additional medication if possible. With decreased EF, he is likely a good ablation candidate. He has an appointment with Dr Elberta Fortis to discuss.  Continue Eliquis 5 mg BID Continue carvedilol 37.5 mg BID  2. Secondary Hypercoagulable State (ICD10:  D68.69) The patient is at significant risk for stroke/thromboembolism based upon his CHA2DS2-VASc Score of 2.  Continue Apixaban (Eliquis).   3. Chronic systolic CHF No signs or symptoms of fluid  overload today.  Hopefully EF will improve with SR.   Follow up with Dr Elberta Fortis as scheduled.    Jorja Loa PA-C [facility] [address] [place], [place] [address] [phone] 06/27/2021 12:13 PM

## 2021-07-28 ENCOUNTER — Other Ambulatory Visit: Payer: Self-pay

## 2021-07-28 ENCOUNTER — Encounter: Payer: Self-pay | Admitting: Cardiology

## 2021-07-28 ENCOUNTER — Ambulatory Visit: Payer: Medicare HMO | Admitting: Cardiology

## 2021-07-28 VITALS — BP 100/62 | HR 97 | Ht 74.5 in | Wt 155.8 lb

## 2021-07-28 DIAGNOSIS — Z01818 Encounter for other preprocedural examination: Secondary | ICD-10-CM | POA: Diagnosis not present

## 2021-07-28 DIAGNOSIS — Z01812 Encounter for preprocedural laboratory examination: Secondary | ICD-10-CM

## 2021-07-28 DIAGNOSIS — I4819 Other persistent atrial fibrillation: Secondary | ICD-10-CM

## 2021-07-28 NOTE — Progress Notes (Signed)
Electrophysiology Office Note   Date:  07/28/2021   ID:  Michael Noble, DOB [birth year], MRN [id number]  PCP:  Patient, No Pcp Per (Inactive)  Cardiologist:  [place] Primary Electrophysiologist:  Fallon Haecker Jorja Loa, MD    Chief Complaint: AF   History of Present Illness: Michael Noble is a 70 y.o. adult who is being seen today for the evaluation of AF at the request of Croitoru, Mihai, MD. Presenting today for electrophysiology evaluation.  He has a history of chronic systolic heart failure, atrial fibrillation, CKD.  He was diagnosed with atrial fibrillation in 2020.  He was hospitalized due to acute heart failure and atrial fibrillation with rapid rates and was started on amiodarone which converted him to sinus rhythm.  He had done well and amiodarone was discontinued.  He was noted to be back in atrial fibrillation and is now status post cardioversion 06/16/2021.  Unfortunately he went back into atrial fibrillation approximately 2 days later.  Today, he denies symptoms of palpitations, chest pain, shortness of breath, orthopnea, PND, lower extremity edema, claudication, dizziness, presyncope, syncope, bleeding, or neurologic sequela. The patient is tolerating medications without difficulties.  He has intermittent episodes of shortness of breath when he is exerting himself.  This also occurs on hot days.  After his cardioversion, he does say that he felt improved with less fatigue and shortness of breath.  He Hildreth Orsak get back into normal rhythm.   Past Medical History:  Diagnosis Date   Arthritis    Hepatitis    hx of 35 years ago    Past Surgical History:  Procedure Laterality Date   CARDIOVERSION N/A 06/16/2021   Procedure: CARDIOVERSION;  Surgeon: Chilton Si, MD;  Location: [facility] ENDOSCOPY;  Service: Cardiovascular;  Laterality: N/A;   TOTAL HIP ARTHROPLASTY Left 06/25/2015   Procedure: LEFT TOTAL HIP ARTHROPLASTY ANTERIOR APPROACH;  Surgeon: Kathryne Hitch, MD;   Location: [facility] ORS;  Service: Orthopedics;  Laterality: Left;     Current Outpatient Medications  Medication Sig Dispense Refill   carvedilol (COREG) 25 MG tablet Take 1.5 tablets (37.5 mg total) by mouth 2 (two) times daily with a meal. 270 tablet 3   ELIQUIS 5 MG TABS tablet Take 1 tablet by mouth twice daily 180 tablet 1   furosemide (LASIX) 20 MG tablet Take 1 tablet (20 mg total) by mouth daily as needed for edema. 30 tablet 1   losartan (COZAAR) 25 MG tablet Take 0.5 tablets (12.5 mg total) by mouth daily.     Lysine 500 MG TABS Take 500 mg by mouth daily.     Misc Natural Products (URINOZINC PO) Take 1 tablet by mouth daily.     Multiple Vitamin (MULTIVITAMIN WITH MINERALS) TABS tablet Take 1 tablet by mouth daily. 50 +     Multiple Vitamins-Minerals (EQ VISION FORMULA 50+ PO) Take 1 tablet by mouth daily.     Turmeric 400 MG CAPS Take 400 mg by mouth daily. With flax seed oil and fish oil combination     No current facility-administered medications for this visit.    Allergies:   Patient has no known allergies.   Social History:  The patient  reports that he has quit smoking. He has quit using smokeless tobacco.  His smokeless tobacco use included chew. He reports that he does not drink alcohol and does not use drugs.   Family History:  The patient's family history includes Aneurysm in his father; Atrial fibrillation in his father; Heart disease in  his mother.    ROS:  Please see the history of present illness.   Otherwise, review of systems is positive for none.   All other systems are reviewed and negative.    PHYSICAL EXAM: VS:  BP 100/62   Pulse 97   Ht 6' 2.5" (1.892 m)   Wt 155 lb 12.8 oz (70.7 kg)   SpO2 98%   BMI 19.74 kg/m  , BMI Body mass index is 19.74 kg/m. GEN: Well nourished, well developed, in no acute distress  HEENT: normal  Neck: no JVD, carotid bruits, or masses Cardiac: irregualr; no murmurs, rubs, or gallops,no edema  Respiratory:  clear to  auscultation bilaterally, normal work of breathing GI: soft, nontender, nondistended, + BS MS: no deformity or atrophy  Skin: warm and dry Neuro:  Strength and sensation are intact Psych: euthymic mood, full affect  EKG:  EKG is not ordered today. Personal review of the ekg ordered 06/27/21 shows atrial fibrillation, rate 104  Recent Labs: 06/13/2021: BUN 34; Creatinine, Ser 1.26; Hemoglobin 14.0; Platelets 145; Potassium 5.1; Sodium 140    Lipid Panel     Component Value Date/Time   CHOL 83 09/04/2019 0223   TRIG 41 09/04/2019 0223   HDL 23 (L) 09/04/2019 0223   CHOLHDL 3.6 09/04/2019 0223   VLDL 8 09/04/2019 0223   LDLCALC 52 09/04/2019 0223     Wt Readings from Last 3 Encounters:  07/28/21 155 lb 12.8 oz (70.7 kg)  06/27/21 154 lb 12.8 oz (70.2 kg)  06/16/21 154 lb 3.2 oz (69.9 kg)      Other studies Reviewed: Additional studies/ records that were reviewed today include: TTE 05/02/21  Review of the above records today demonstrates:   1. Left ventricular ejection fraction, by estimation, is 25 to 30%. The  left ventricle has severely decreased function. The left ventricle  demonstrates global hypokinesis. The left ventricular internal cavity size  was mildly dilated. Left ventricular  diastolic parameters are consistent with Grade III diastolic dysfunction  (restrictive).   2. Right ventricular systolic function is normal. The right ventricular  size is normal.   3. The mitral valve is normal in structure. Moderate mitral valve  regurgitation.   4. The aortic valve is normal in structure. Aortic valve regurgitation is  trivial. No aortic stenosis is present.   ASSESSMENT AND PLAN:  1.  Persistent atrial fibrillation: CHA2DS2-VASc of 2.  Currently on Eliquis 5 mg twice daily, carvedilol 37.5 mg twice daily.  He would be a candidate either for dofetilide or ablation.  At this point, he would prefer to avoid further medication management.  Due to that, we Torry Adamczak plan for  ablation.  Risk, benefits, and alternatives to EP study and radiofrequency ablation for afib were also discussed in detail today. These risks include but are not limited to stroke, bleeding, vascular damage, tamponade, perforation, damage to the esophagus, lungs, and other structures, pulmonary vein stenosis, worsening renal function, and death. The patient understands these risk and wishes to proceed.  We Karaline Buresh therefore proceed with catheter ablation at the next available time.  Carto, ICE, anesthesia are requested for the procedure.  Leah Thornberry also obtain CT PV protocol prior to the procedure to exclude LAA thrombus and further evaluate atrial anatomy.   2.  Chronic systolic heart failure: Potentially due to a tachycardia mediated cardiomyopathy.  His ejection fraction has gone down since he has been diagnosed with atrial fibrillation.  Case discussed with primary cardiology  Current medicines are reviewed at  length with the patient today.   The patient does not have concerns regarding his medicines.  The following changes were made today:  none  Labs/ tests ordered today include:  Orders Placed This Encounter  Procedures   CT CARDIAC MORPH/PULM VEIN W/CM&W/O CA SCORE   Basic metabolic panel   CBC     Disposition:   FU with Kaushal Vannice 3 months  Signed, Aariyah Sampey Jorja Loa, MD  07/28/2021 11:02 AM     [facility] [address] [place] [address] [phone] (office) [phone] (fax)

## 2021-07-28 NOTE — Patient Instructions (Signed)
Medication Instructions:  Your physician recommends that you continue on your current medications as directed. Please refer to the Current Medication list given to you today.  *If you need a refill on your cardiac medications before your next appointment, please call your pharmacy*   Lab Work: Pre procedure labs 08/29/2021:  BMP & CBC  If you have labs (blood work) drawn today and your tests are completely normal, you will receive your results only by: MyChart Message (if you have MyChart) OR A paper copy in the mail If you have any lab test that is abnormal or we need to change your treatment, we will call you to review the results.   Testing/Procedures: Your physician has requested that you have cardiac CT within 7 days PRIOR to your ablation. Cardiac computed tomography (CT) is a painless test that uses an x-ray machine to take clear, detailed pictures of your heart.  Please follow instruction below located under "other instructions". You will get a call from our office to schedule the date for this test.  Your physician has recommended that you have an ablation. Catheter ablation is a medical procedure used to treat some cardiac arrhythmias (irregular heartbeats). During catheter ablation, a long, thin, flexible tube is put into a blood vessel in your groin (upper thigh), or neck. This tube is called an ablation catheter. It is then guided to your heart through the blood vessel. Radio frequency waves destroy small areas of heart tissue where abnormal heartbeats may cause an arrhythmia to start. Please follow instruction below located under "other instructions".   Follow-Up: At [facility], you and your health needs are our priority.  As part of our continuing mission to provide you with exceptional heart care, we have created designated Provider Care Teams.  These Care Teams include your primary Cardiologist (physician) and Advanced Practice Providers (APPs -  Physician Assistants and  Nurse Practitioners) who all work together to provide you with the care you need, when you need it.  Your next appointment:   1 month(s) after your ablation  The format for your next appointment:   In Person  Provider:   AFib clinic   Thank you for choosing [facility]!!   Dory Horn, RN [phone]    Other Instructions   CT INSTRUCTIONS Your cardiac CT will be scheduled at:  [facility] [address] [place], [place] [address] [phone]  Please arrive at the [facility] main entrance (entrance A) of [facility] 30 minutes prior to test start time. Proceed to the [facility] Radiology Department (first floor) to check-in and test prep.   Please follow these instructions carefully (unless otherwise directed):  Hold all erectile dysfunction medications at least 3 days (72 hrs) prior to test.  On the Night Before the Test: Be sure to Drink plenty of water. Do not consume any caffeinated/decaffeinated beverages or chocolate 12 hours prior to your test. Do not take any antihistamines 12 hours prior to your test.  On the Day of the Test: Drink plenty of water until 1 hour prior to the test. Do not eat any food 4 hours prior to the test. You may take your regular medications prior to the test.  Take metoprolol (Lopressor) 100 mg two hours prior to test -- you will hold your morning dose of Carvedilol this day. HOLD Furosemide/Hydrochlorothiazide morning of the test       After the Test: Drink plenty of water. After receiving IV contrast, you may experience  a mild flushed feeling. This is normal. On occasion, you may experience a mild rash up to 24 hours after the test. This is not dangerous. If this occurs, you can take Benadryl 25 mg and increase your fluid intake. If you experience trouble breathing, this can be serious. If it is severe call 911 IMMEDIATELY. If it is mild, please call our office. If you take any of these  medications: Glipizide/Metformin, Avandament, Glucavance, please do not take 48 hours after completing test unless otherwise instructed.   Once we have confirmed authorization from your insurance company, we will call you to set up a date and time for your test. Based on how quickly your insurance processes prior authorizations requests, please allow up to 4 weeks to be contacted for scheduling your Cardiac CT appointment. Be advised that routine Cardiac CT appointments could be scheduled as many as 8 weeks after your provider has ordered it.  For non-scheduling related questions, please contact the cardiac imaging nurse navigator should you have any questions/concerns: Rockwell Alexandria, Cardiac Imaging Nurse Navigator Larey Brick, Cardiac Imaging Nurse Navigator [facility] Direct Office Dial: [phone]   For scheduling needs, including cancellations and rescheduling, please call [place], [phone].     Electrophysiology/Ablation Procedure Instructions   You are scheduled for a(n)  ablation on 09/16/2021 with Dr. Loman Brooklyn.   1.   Pre procedure testing-             A.  LAB WORK --- On 08/29/2021 or your pre procedure blood work.   You do NOT need to be fasting.  You can stop by the office anytime between 7:30 am - 4:30 pm   On the day of your procedure 09/16/21 you will go to [facility] ([address]) at 8:30 am.  Bonita Quin will go to the main entrance A [employer]) and enter where the [employer] are.  Your driver will drop you off and you will head down the hallway to ADMITTING.  You may have one support person come in to the hospital with you.  They will be asked to wait in the waiting room. It is OK to have someone drop you off and come back when you are ready to be discharged.   3.   Do not eat or drink after midnight prior to your procedure.   4.   On the morning of your procedure do NOT take any medication. Do not miss any doses  of your blood thinner prior to the morning of your procedure or your procedure will need to be rescheduled.   5.  Plan for an overnight stay but you may be discharged after your procedure, if you use your phone frequently bring your phone charger. If you are discharged after your procedure you will need someone to drive you home and be with you for 24 hours after your procedure.   6. You will follow up with the AFIB clinic 4 weeks after your procedure.  You will follow up with Dr. Elberta Fortis  3 months after your procedure.  These appointments will be made for you.   7. FYI: For your safety, and to allow [place] to monitor your vital signs accurately during the surgery/procedure we request that if you have artificial nails, gel coating, SNS etc. Please have those removed prior to your surgery/procedure. Not having the nail coverings /polish removed may result in cancellation or delay of your surgery/procedure.  * If you have ANY questions  please call the office [phone] and ask for Oris Staffieri RN or send me a MyChart message   * Occasionally, EP Studies and ablations can become lengthy.  Please make your family aware of this before your procedure starts.  Average time ranges from 2-8 hours for EP studies/ablations.  Your physician will call your family after the procedure with the results.                                    Cardiac Ablation Cardiac ablation is a procedure to destroy (ablate) some heart tissue that is sending bad signals. These bad signals cause problems in heart rhythm. The heart has many areas that make these signals. If there are problems in these areas, they can make the heart beat in a way that is not normal. Destroying some tissues can help make the heart rhythm normal. Tell your doctor about: Any allergies you have. All medicines you are taking. These include vitamins, herbs, eye drops, creams, and over-the-counter medicines. Any problems you or family members have had with  medicines that make you fall asleep (anesthetics). Any blood disorders you have. Any surgeries you have had. Any medical conditions you have, such as kidney failure. Whether you are pregnant or may be pregnant. What are the risks? This is a safe procedure. But problems may occur, including: Infection. Bruising and bleeding. Bleeding into the chest. Stroke or blood clots. Damage to nearby areas of your body. Allergies to medicines or dyes. The need for a pacemaker if the normal system is damaged. Failure of the procedure to treat the problem. What happens before the procedure? Medicines Ask your doctor about: Changing or stopping your normal medicines. This is important. Taking aspirin and ibuprofen. Do not take these medicines unless your doctor tells you to take them. Taking other medicines, vitamins, herbs, and supplements. General instructions Follow instructions from your doctor about what you cannot eat or drink. Plan to have someone take you home from the hospital or clinic. If you will be going home right after the procedure, plan to have someone with you for 24 hours. Ask your doctor what steps will be taken to prevent infection. What happens during the procedure?  An IV tube will be put into one of your veins. You will be given a medicine to help you relax. The skin on your neck or groin will be numbed. A cut (incision) will be made in your neck or groin. A needle will be put through your cut and into a large vein. A tube (catheter) will be put into the needle. The tube will be moved to your heart. Dye may be put through the tube. This helps your doctor see your heart. Small devices (electrodes) on the tube will send out signals. A type of energy will be used to destroy some heart tissue. The tube will be taken out. Pressure will be held on your cut. This helps stop bleeding. A bandage will be put over your cut. The exact procedure may vary among doctors and  hospitals. What happens after the procedure? You will be watched until you leave the hospital or clinic. This includes checking your heart rate, breathing rate, oxygen, and blood pressure. Your cut will be watched for bleeding. You will need to lie still for a few hours. Do not drive for 24 hours or as long as your doctor tells you. Summary Cardiac ablation is a procedure  to destroy some heart tissue. This is done to treat heart rhythm problems. Tell your doctor about any medical conditions you may have. Tell him or her about all medicines you are taking to treat them. This is a safe procedure. But problems may occur. These include infection, bruising, bleeding, and damage to nearby areas of your body. Follow what your doctor tells you about food and drink. You may also be told to change or stop some of your medicines. After the procedure, do not drive for 24 hours or as long as your doctor tells you. This information is not intended to replace advice given to you by your health care provider. Make sure you discuss any questions you have with your health care provider. Document Revised: 10/09/2019 Document Reviewed: 10/09/2019 Elsevier Patient Education  2022 [employer].

## 2021-08-22 ENCOUNTER — Telehealth: Payer: Self-pay

## 2021-08-22 MED ORDER — METOPROLOL TARTRATE 100 MG PO TABS: ORAL_TABLET | ORAL | 0 refills | Status: DC

## 2021-08-22 NOTE — Telephone Encounter (Signed)
Outreach made to Pt.  Pt would like to move ablation up to August 31, 2021  Cardiac CT approved and scheduled for August 29, 2021  Pt coming for lab work tomorrow 10/4-will meet with Pt to go over instructions.  Work up complete.

## 2021-08-23 ENCOUNTER — Other Ambulatory Visit: Payer: Self-pay

## 2021-08-23 ENCOUNTER — Other Ambulatory Visit: Payer: Medicare HMO | Admitting: *Deleted

## 2021-08-23 DIAGNOSIS — Z01812 Encounter for preprocedural laboratory examination: Secondary | ICD-10-CM | POA: Diagnosis not present

## 2021-08-23 DIAGNOSIS — I4819 Other persistent atrial fibrillation: Secondary | ICD-10-CM

## 2021-08-23 LAB — BASIC METABOLIC PANEL
BUN/Creatinine Ratio: 27 — ABNORMAL HIGH (ref 10–24)
BUN: 38 mg/dL — ABNORMAL HIGH (ref 8–27)
CO2: 22 mmol/L (ref 20–29)
Calcium: 10 mg/dL (ref 8.6–10.2)
Chloride: 101 mmol/L (ref 96–106)
Creatinine, Ser: 1.39 mg/dL — ABNORMAL HIGH (ref 0.76–1.27)
Glucose: 86 mg/dL (ref 70–99)
Potassium: 4.9 mmol/L (ref 3.5–5.2)
Sodium: 138 mmol/L (ref 134–144)
eGFR: 55 mL/min/{1.73_m2} — ABNORMAL LOW (ref >59)

## 2021-08-23 LAB — CBC
Hematocrit: 38.6 % (ref 37.5–51.0)
Hemoglobin: 13 g/dL (ref 13.0–17.7)
MCH: 31.9 pg (ref 26.6–33.0)
MCHC: 33.7 g/dL (ref 31.5–35.7)
MCV: 95 fL (ref 79–97)
Platelets: 148 10*3/uL — ABNORMAL LOW (ref 150–450)
RBC: 4.08 x10E6/uL — ABNORMAL LOW (ref 4.14–5.80)
RDW: 12.3 % (ref 11.6–15.4)
WBC: 7.7 10*3/uL (ref 3.4–10.8)

## 2021-08-25 ENCOUNTER — Telehealth (HOSPITAL_COMMUNITY): Payer: Self-pay | Admitting: Emergency Medicine

## 2021-08-25 NOTE — Telephone Encounter (Signed)
Attempted to call patient regarding upcoming cardiac CT appointment. °Left message on voicemail with name and callback number °Roel Douthat RN Navigator Cardiac Imaging °[facility] °[phone] Office °[phone] Cell ° °

## 2021-08-25 NOTE — Telephone Encounter (Signed)
Reaching out to patient to offer assistance regarding upcoming cardiac imaging study; pt verbalizes understanding of appt date/time, parking situation and where to check in, pre-test NPO status and medications ordered, and verified current allergies; name and call back number provided for further questions should they arise Rockwell Alexandria RN Navigator Cardiac Imaging [facility] [phone] office [phone] cell  100mg  metoprolol tart (holding carvedilol)

## 2021-08-29 ENCOUNTER — Other Ambulatory Visit: Payer: Medicare HMO

## 2021-08-29 ENCOUNTER — Other Ambulatory Visit: Payer: Self-pay

## 2021-08-29 ENCOUNTER — Ambulatory Visit (HOSPITAL_COMMUNITY)
Admission: RE | Admit: 2021-08-29 | Discharge: 2021-08-29 | Disposition: A | Discharge status: Home / Self Care | Payer: Medicare HMO | Source: Ambulatory Visit | Attending: Cardiology | Admitting: Cardiology

## 2021-08-29 DIAGNOSIS — I4819 Other persistent atrial fibrillation: Secondary | ICD-10-CM | POA: Insufficient documentation

## 2021-08-29 MED ORDER — METOPROLOL TARTRATE 5 MG/5ML IV SOLN: 5.0000 mg | INTRAVENOUS | Status: DC | PRN

## 2021-08-29 MED ORDER — METOPROLOL TARTRATE 5 MG/5ML IV SOLN
INTRAVENOUS | Status: AC
  Administered 2021-08-29: 5 mg via INTRAVENOUS
  Filled 2021-08-29: qty 10

## 2021-08-29 MED ORDER — IOHEXOL 350 MG/ML SOLN
80.0000 mL | Freq: Once | INTRAVENOUS | Status: AC | PRN
  Administered 2021-08-29: 80 mL via INTRAVENOUS

## 2021-08-30 NOTE — Pre-Procedure Instructions (Signed)
Attempted to call patient regarding procedure instructions.  Left voice mail on the following items: Arrival time 0630 Nothing to eat or drink after midnight No meds AM of procedure Responsible person to drive you home and stay with you for 24 hrs  Have you missed any doses of anti-coagulant Eliquis- take both doses today, none in the morning

## 2021-08-31 ENCOUNTER — Ambulatory Visit (HOSPITAL_COMMUNITY): Payer: Medicare HMO | Admitting: Certified Registered"

## 2021-08-31 ENCOUNTER — Ambulatory Visit (HOSPITAL_COMMUNITY)
Admission: RE | Admit: 2021-08-31 | Discharge: 2021-08-31 | Disposition: A | Discharge status: Home / Self Care | Payer: Medicare HMO | Attending: Cardiology | Admitting: Cardiology

## 2021-08-31 ENCOUNTER — Other Ambulatory Visit: Payer: Self-pay

## 2021-08-31 ENCOUNTER — Encounter (HOSPITAL_COMMUNITY)
Admission: RE | Disposition: A | Discharge status: Home / Self Care | Payer: Self-pay | Source: Home / Self Care | Attending: Cardiology

## 2021-08-31 DIAGNOSIS — N189 Chronic kidney disease, unspecified: Secondary | ICD-10-CM | POA: Diagnosis not present

## 2021-08-31 DIAGNOSIS — Z87891 Personal history of nicotine dependence: Secondary | ICD-10-CM | POA: Diagnosis not present

## 2021-08-31 DIAGNOSIS — Z8249 Family history of ischemic heart disease and other diseases of the circulatory system: Secondary | ICD-10-CM | POA: Diagnosis not present

## 2021-08-31 DIAGNOSIS — I110 Hypertensive heart disease with heart failure: Secondary | ICD-10-CM | POA: Diagnosis not present

## 2021-08-31 DIAGNOSIS — I5022 Chronic systolic (congestive) heart failure: Secondary | ICD-10-CM | POA: Insufficient documentation

## 2021-08-31 DIAGNOSIS — I4819 Other persistent atrial fibrillation: Secondary | ICD-10-CM | POA: Insufficient documentation

## 2021-08-31 DIAGNOSIS — I509 Heart failure, unspecified: Secondary | ICD-10-CM | POA: Diagnosis not present

## 2021-08-31 DIAGNOSIS — Z8619 Personal history of other infectious and parasitic diseases: Secondary | ICD-10-CM | POA: Diagnosis not present

## 2021-08-31 DIAGNOSIS — N179 Acute kidney failure, unspecified: Secondary | ICD-10-CM | POA: Diagnosis not present

## 2021-08-31 HISTORY — PX: ATRIAL FIBRILLATION ABLATION: EP1191

## 2021-08-31 LAB — POCT ACTIVATED CLOTTING TIME
Activated Clotting Time: 277 seconds
Activated Clotting Time: 306 seconds
Activated Clotting Time: 323 seconds

## 2021-08-31 SURGERY — ATRIAL FIBRILLATION ABLATION
Anesthesia: General

## 2021-08-31 MED ORDER — ROCURONIUM BROMIDE 10 MG/ML (PF) SYRINGE
PREFILLED_SYRINGE | INTRAVENOUS | Status: DC | PRN
  Administered 2021-08-31: 60 mg via INTRAVENOUS

## 2021-08-31 MED ORDER — PROPOFOL 10 MG/ML IV BOLUS
INTRAVENOUS | Status: DC | PRN
  Administered 2021-08-31: 100 mg via INTRAVENOUS
  Administered 2021-08-31: 30 mg via INTRAVENOUS

## 2021-08-31 MED ORDER — HEPARIN (PORCINE) IN NACL 1000-0.9 UT/500ML-% IV SOLN
INTRAVENOUS | Status: AC
  Filled 2021-08-31: qty 500

## 2021-08-31 MED ORDER — LIDOCAINE 2% (20 MG/ML) 5 ML SYRINGE
INTRAMUSCULAR | Status: DC | PRN
  Administered 2021-08-31: 50 mg via INTRAVENOUS

## 2021-08-31 MED ORDER — ONDANSETRON HCL 4 MG/2ML IJ SOLN
INTRAMUSCULAR | Status: DC | PRN
  Administered 2021-08-31: 4 mg via INTRAVENOUS

## 2021-08-31 MED ORDER — HEPARIN SODIUM (PORCINE) 1000 UNIT/ML IJ SOLN
INTRAMUSCULAR | Status: AC
  Filled 2021-08-31: qty 1

## 2021-08-31 MED ORDER — HEPARIN SODIUM (PORCINE) 1000 UNIT/ML IJ SOLN
INTRAMUSCULAR | Status: DC | PRN
  Administered 2021-08-31: 1000 [IU] via INTRAVENOUS

## 2021-08-31 MED ORDER — DOBUTAMINE INFUSION FOR EP/ECHO/NUC (1000 MCG/ML)
INTRAVENOUS | Status: AC
  Filled 2021-08-31: qty 250

## 2021-08-31 MED ORDER — HEPARIN (PORCINE) IN NACL 1000-0.9 UT/500ML-% IV SOLN
INTRAVENOUS | Status: DC | PRN
  Administered 2021-08-31 (×5): 500 mL

## 2021-08-31 MED ORDER — FENTANYL CITRATE (PF) 100 MCG/2ML IJ SOLN
INTRAMUSCULAR | Status: DC | PRN
  Administered 2021-08-31: 50 ug via INTRAVENOUS

## 2021-08-31 MED ORDER — SODIUM CHLORIDE 0.9% FLUSH: 3.0000 mL | INTRAVENOUS | Status: DC | PRN

## 2021-08-31 MED ORDER — ONDANSETRON HCL 4 MG/2ML IJ SOLN: 4.0000 mg | Freq: Four times a day (QID) | INTRAMUSCULAR | Status: DC | PRN

## 2021-08-31 MED ORDER — PROTAMINE SULFATE 10 MG/ML IV SOLN
INTRAVENOUS | Status: DC | PRN
  Administered 2021-08-31: 40 mg via INTRAVENOUS

## 2021-08-31 MED ORDER — PHENYLEPHRINE HCL-NACL 20-0.9 MG/250ML-% IV SOLN
INTRAVENOUS | Status: DC | PRN
  Administered 2021-08-31: 50 ug/min via INTRAVENOUS

## 2021-08-31 MED ORDER — DOBUTAMINE INFUSION FOR EP/ECHO/NUC (1000 MCG/ML)
INTRAVENOUS | Status: DC | PRN
  Administered 2021-08-31: 20 ug/kg/min via INTRAVENOUS

## 2021-08-31 MED ORDER — PHENYLEPHRINE HCL (PRESSORS) 10 MG/ML IV SOLN
INTRAVENOUS | Status: DC | PRN
  Administered 2021-08-31 (×3): 80 ug via INTRAVENOUS

## 2021-08-31 MED ORDER — HEPARIN SODIUM (PORCINE) 1000 UNIT/ML IJ SOLN
INTRAMUSCULAR | Status: DC | PRN
  Administered 2021-08-31: 3000 [IU] via INTRAVENOUS
  Administered 2021-08-31: 2000 [IU] via INTRAVENOUS
  Administered 2021-08-31: 14000 [IU] via INTRAVENOUS
  Administered 2021-08-31: 4000 [IU] via INTRAVENOUS

## 2021-08-31 MED ORDER — DEXAMETHASONE SODIUM PHOSPHATE 10 MG/ML IJ SOLN
INTRAMUSCULAR | Status: DC | PRN
  Administered 2021-08-31: 10 mg via INTRAVENOUS

## 2021-08-31 MED ORDER — SODIUM CHLORIDE 0.9 % IV SOLN: 250.0000 mL | INTRAVENOUS | Status: DC | PRN

## 2021-08-31 MED ORDER — SUGAMMADEX SODIUM 200 MG/2ML IV SOLN
INTRAVENOUS | Status: DC | PRN
  Administered 2021-08-31: 200 mg via INTRAVENOUS

## 2021-08-31 MED ORDER — ACETAMINOPHEN 325 MG PO TABS
650.0000 mg | ORAL_TABLET | ORAL | Status: DC | PRN
  Filled 2021-08-31: qty 2

## 2021-08-31 MED ORDER — SODIUM CHLORIDE 0.9 % IV SOLN: INTRAVENOUS | Status: DC

## 2021-08-31 SURGICAL SUPPLY — 20 items
BAG SNAP BAND KOVER 36X36 (MISCELLANEOUS) ×2 IMPLANT
CATH OCTARAY 2.0 F 3-3-3-3-3 (CATHETERS) ×2 IMPLANT
CATH S CIRCA THERM PROBE 10F (CATHETERS) ×2 IMPLANT
CATH SMTCH THERMOCOOL SF DF (CATHETERS) ×2 IMPLANT
CATH SOUNDSTAR ECO 8FR (CATHETERS) ×2 IMPLANT
CATH WEBSTER BI DIR CS D-F CRV (CATHETERS) ×2 IMPLANT
CLOSURE PERCLOSE PROSTYLE (VASCULAR PRODUCTS) ×8 IMPLANT
COVER SWIFTLINK CONNECTOR (BAG) ×2 IMPLANT
KIT VERSACROSS STEERABLE D1 (CATHETERS) ×2 IMPLANT
MAT PREVALON FULL STRYKER (MISCELLANEOUS) ×2 IMPLANT
PACK EP LATEX FREE (CUSTOM PROCEDURE TRAY) ×2
PACK EP LF (CUSTOM PROCEDURE TRAY) ×1 IMPLANT
PAD PRO RADIOLUCENT 2001M-C (PAD) ×2 IMPLANT
PATCH CARTO3 (PAD) ×2 IMPLANT
SHEATH CARTO VIZIGO SM CVD (SHEATH) ×2 IMPLANT
SHEATH PINNACLE 7F 10CM (SHEATH) ×2 IMPLANT
SHEATH PINNACLE 8F 10CM (SHEATH) ×4 IMPLANT
SHEATH PINNACLE 9F 10CM (SHEATH) ×2 IMPLANT
SHEATH PROBE COVER 6X72 (BAG) ×2 IMPLANT
TUBING SMART ABLATE COOLFLOW (TUBING) ×2 IMPLANT

## 2021-08-31 NOTE — Anesthesia Preprocedure Evaluation (Addendum)
Anesthesia Evaluation  Patient identified by MRN, date of birth, ID band Patient awake    Reviewed: Allergy & Precautions, NPO status , Patient's Chart, lab work & pertinent test results, reviewed documented beta blocker date and time   Airway Mallampati: II  TM Distance: >3 FB Neck ROM: Full   Comment: High narrow palate Dental  (+) Teeth Intact, Dental Advisory Given   Pulmonary former smoker,    Pulmonary exam normal breath sounds clear to auscultation       Cardiovascular hypertension, Pt. on medications and Pt. on home beta blockers +CHF (LVEF 25-30%)  Normal cardiovascular exam+ dysrhythmias (eliquis) Atrial Fibrillation + Valvular Problems/Murmurs (mod MR) MR  Rhythm:Regular Rate:Normal  Echo June 2022: 1. Left ventricular ejection fraction, by estimation, is 25 to 30%. The  left ventricle has severely decreased function. The left ventricle  demonstrates global hypokinesis. The left ventricular internal cavity size  was mildly dilated. Left ventricular  diastolic parameters are consistent with Grade III diastolic dysfunction  (restrictive).  2. Right ventricular systolic function is normal. The right ventricular  size is normal.  3. The mitral valve is normal in structure. Moderate mitral valve  regurgitation.  4. The aortic valve is normal in structure. Aortic valve regurgitation is  trivial. No aortic stenosis is present.    Neuro/Psych negative neurological ROS  negative psych ROS   GI/Hepatic negative GI ROS, Neg liver ROS,   Endo/Other  negative endocrine ROS  Renal/GU Renal InsufficiencyRenal diseaseCr 1.39  negative genitourinary   Musculoskeletal  (+) Arthritis , Osteoarthritis,    Abdominal   Peds negative pediatric ROS (+)  Hematology negative hematology ROS (+) hct 38.6, plt 148   Anesthesia Other Findings   Reproductive/Obstetrics negative OB ROS                             Anesthesia Physical Anesthesia Plan  ASA: 3  Anesthesia Plan: General   Post-op Pain Management:    Induction: Intravenous  PONV Risk Score and Plan: 2 and Ondansetron, Dexamethasone and Treatment may vary due to age or medical condition  Airway Management Planned: Oral ETT  Additional Equipment: None  Intra-op Plan:   Post-operative Plan: Extubation in OR  Informed Consent: I have reviewed the patients History and Physical, chart, labs and discussed the procedure including the risks, benefits and alternatives for the proposed anesthesia with the patient or authorized representative who has indicated his/her understanding and acceptance.     Dental advisory given  Plan Discussed with: CRNA  Anesthesia Plan Comments:         Anesthesia Quick Evaluation

## 2021-08-31 NOTE — H&P (Signed)
Electrophysiology Office Note   Date:  08/31/2021   ID:  [name], DOB Aug 10, 1951, MRN [id number]  PCP:  Patient, No Pcp Per (Inactive)  Cardiologist:  [place] Primary Electrophysiologist:  Xandria Gallaga Jorja Loa, MD    Chief Complaint: AF   History of Present Illness: Michael Noble is a 70 y.o. adult who is being seen today for the evaluation of AF at the request of No ref. provider found. Presenting today for electrophysiology evaluation.  He has a history of chronic systolic heart failure, atrial fibrillation, CKD.  He was diagnosed with atrial fibrillation in 2020.  He was hospitalized due to acute heart failure and atrial fibrillation with rapid rates and was started on amiodarone which converted him to sinus rhythm.  He had done well and amiodarone was discontinued.  He was noted to be back in atrial fibrillation and is now status post cardioversion 06/16/2021.  Unfortunately he went back into atrial fibrillation approximately 2 days later.  Today, denies symptoms of palpitations, chest pain, shortness of breath, orthopnea, PND, lower extremity edema, claudication, dizziness, presyncope, syncope, bleeding, or neurologic sequela. The patient is tolerating medications without difficulties. Plan ablation today.    Past Medical History:  Diagnosis Date   Arthritis    Hepatitis    hx of 35 years ago    Past Surgical History:  Procedure Laterality Date   CARDIOVERSION N/A 06/16/2021   Procedure: CARDIOVERSION;  Surgeon: Chilton Si, MD;  Location[facility] ENDOSCOPY;  Service: Cardiovascular;  Laterality: N/A;   TOTAL HIP ARTHROPLASTY Left 06/25/2015   Procedure: LEFT TOTAL HIP ARTHROPLASTY ANTERIOR APPROACH;  Surgeon: Kathryne Hitch, MD;  Location: [facility] ORS;  Service: Orthopedics;  Laterality: Left;     Current Facility-Administered Medications  Medication Dose Route Frequency Provider Last Rate Last Admin   0.9 %  sodium chloride infusion   Intravenous Continuous  Regan Lemming, MD 50 mL/hr at 08/31/21 0641 New Bag at 08/31/21 0641    Allergies:   Patient has no known allergies.   Social History:  The patient  reports that he has quit smoking. He has quit using smokeless tobacco.  His smokeless tobacco use included chew. He reports that he does not drink alcohol and does not use drugs.   Family History:  The patient's family history includes Aneurysm in his father; Atrial fibrillation in his father; Heart disease in his mother.   ROS:  Please see the history of present illness.   Otherwise, review of systems is positive for none.   All other systems are reviewed and negative.   PHYSICAL EXAM: VS:  BP 114/83   Pulse (!) 101   Temp 97.8 F (36.6 C) (Oral)   Resp 18   Ht 6' 2.5" (1.892 m)   Wt 74.8 kg   SpO2 98%   BMI 20.90 kg/m  , BMI Body mass index is 20.9 kg/m. GEN: Well nourished, well developed, in no acute distress  HEENT: normal  Neck: no JVD, carotid bruits, or masses Cardiac: RRR; no murmurs, rubs, or gallops,no edema  Respiratory:  clear to auscultation bilaterally, normal work of breathing GI: soft, nontender, nondistended, + BS MS: no deformity or atrophy  Skin: warm and dry Neuro:  Strength and sensation are intact Psych: euthymic mood, full affect  Recent Labs: 08/23/2021: BUN 38; Creatinine, Ser 1.39; Hemoglobin 13.0; Platelets 148; Potassium 4.9; Sodium 138    Lipid Panel     Component Value Date/Time   CHOL 83 09/04/2019 0223  TRIG 41 09/04/2019 0223   HDL 23 (L) 09/04/2019 0223   CHOLHDL 3.6 09/04/2019 0223   VLDL 8 09/04/2019 0223   LDLCALC 52 09/04/2019 0223     Wt Readings from Last 3 Encounters:  08/31/21 74.8 kg  07/28/21 70.7 kg  06/27/21 70.2 kg      Other studies Reviewed: Additional studies/ records that were reviewed today include: TTE 05/02/21  Review of the above records today demonstrates:   1. Left ventricular ejection fraction, by estimation, is 25 to 30%. The  left ventricle has  severely decreased function. The left ventricle  demonstrates global hypokinesis. The left ventricular internal cavity size  was mildly dilated. Left ventricular  diastolic parameters are consistent with Grade III diastolic dysfunction  (restrictive).   2. Right ventricular systolic function is normal. The right ventricular  size is normal.   3. The mitral valve is normal in structure. Moderate mitral valve  regurgitation.   4. The aortic valve is normal in structure. Aortic valve regurgitation is  trivial. No aortic stenosis is present.   ASSESSMENT AND PLAN:  1.  Persistent atrial fibrillation: Michael Noble has presented today for surgery, with the diagnosis of atrial fibrillation.  The various methods of treatment have been discussed with the patient and family. After consideration of risks, benefits and other options for treatment, the patient has consented to  Procedure(s): Catheter ablation as a surgical intervention .  Risks include but not limited to complete heart block, stroke, esophageal damage, nerve damage, bleeding, vascular damage, tamponade, perforation, MI, and death. The patient's history has been reviewed, patient examined, no change in status, stable for surgery.  I have reviewed the patient's chart and labs.  Questions were answered to the patient's satisfaction.    Clydette Privitera Elberta Fortis, MD 08/31/2021 7:52 AM

## 2021-08-31 NOTE — Anesthesia Postprocedure Evaluation (Signed)
Anesthesia Post Note  Patient: Michael Noble  Procedure(s) Performed: ATRIAL FIBRILLATION ABLATION     Patient location during evaluation: PACU Anesthesia Type: General Level of consciousness: awake and alert, oriented and patient cooperative Pain management: pain level controlled Vital Signs Assessment: post-procedure vital signs reviewed and stable Respiratory status: spontaneous breathing, nonlabored ventilation and respiratory function stable Cardiovascular status: blood pressure returned to baseline and stable Postop Assessment: no apparent nausea or vomiting Anesthetic complications: no   There were no known notable events for this encounter.  Last Vitals:  Vitals:   08/31/21 1300 08/31/21 1330  BP: 102/65 105/64  Pulse: 83 85  Resp: 17 16  Temp:    SpO2: 100% 100%    Last Pain:  Vitals:   08/31/21 1215  TempSrc:   PainSc: 0-No pain                 Lannie Fields

## 2021-08-31 NOTE — Transfer of Care (Signed)
Immediate Anesthesia Transfer of Care Note  Patient: Michael Noble  Procedure(s) Performed: ATRIAL FIBRILLATION ABLATION  Patient Location: PACU  Anesthesia Type:General  Level of Consciousness: awake, alert , oriented and patient cooperative  Airway & Oxygen Therapy: Patient Spontanous Breathing and Patient connected to face mask oxygen  Post-op Assessment: Report given to RN, Post -op Vital signs reviewed and stable and Patient moving all extremities  Post vital signs: Reviewed and stable  Last Vitals:  Vitals Value Taken Time  BP 93/44 08/31/21 1129  Temp    Pulse 86 08/31/21 1131  Resp 16 08/31/21 1131  SpO2 100 % 08/31/21 1131  Vitals shown include unvalidated device data.  Last Pain:  Vitals:   08/31/21 0657  TempSrc:   PainSc: 0-No pain         Complications: There were no known notable events for this encounter.

## 2021-08-31 NOTE — Anesthesia Procedure Notes (Signed)
Procedure Name: Intubation Date/Time: 08/31/2021 8:43 AM Performed by: Myna Bright, CRNA Pre-anesthesia Checklist: Patient identified, Emergency Drugs available, Suction available and Patient being monitored Patient Re-evaluated:Patient Re-evaluated prior to induction Oxygen Delivery Method: Circle system utilized Preoxygenation: Pre-oxygenation with 100% oxygen Induction Type: IV induction Ventilation: Mask ventilation without difficulty Laryngoscope Size: Mac and 4 Grade View: Grade I Tube type: Oral Tube size: 7.5 mm Number of attempts: 1 Airway Equipment and Method: Stylet Placement Confirmation: ETT inserted through vocal cords under direct vision, positive ETCO2 and breath sounds checked- equal and bilateral Secured at: 22 cm Tube secured with: Tape Dental Injury: Teeth and Oropharynx as per pre-operative assessment

## 2021-08-31 NOTE — Discharge Instructions (Addendum)
Post procedure care instructions No driving for 4 days. No lifting over 5 lbs for 1 week. No vigorous or sexual activity for 1 week. You may return to work/your usual activities on 09/08/21. Keep procedure site clean & dry. If you notice increased pain, swelling, bleeding or pus, call/return!  You may shower after 24 hours, but no soaking in baths/hot tubs/pools for 1 week.    You have an appointment set up with the Atrial Fibrillation Clinic.  Multiple studies have shown that being followed by a dedicated atrial fibrillation clinic in addition to the standard care you receive from your other physicians improves health. We believe that enrollment in the atrial fibrillation clinic will allow [place] to better care for you.   The phone number to the Atrial Fibrillation Clinic is [phone]. The clinic is staffed Monday through Friday from 8:30am to 5pm.  Parking Directions: The clinic is located in the Heart and Vascular Building connected to [facility]. 1)From [address] turn on to [address] and go to the 3rd entrance  (Heart and Vascular entrance) on the right. 2)Look to the right for Heart &Vascular Parking Garage. 3)A code for the entrance is required, for November is 4444.   4)Take the elevators to the 1st floor. Registration is in the room with the glass walls at the end of the hallway.  If you have any trouble parking or locating the clinic, please don't hesitate to call [phone].   Cardiac Ablation, Care After  This sheet gives you information about how to care for yourself after your procedure. Your health care provider may also give you more specific instructions. If you have problems or questions, contact your health care provider. What can I expect after the procedure? After the procedure, it is common to have: Bruising around your puncture site. Tenderness around your puncture site. Skipped heartbeats. Tiredness (fatigue).  Follow these instructions at  home: Puncture site care  Follow instructions from your health care provider about how to take care of your puncture site. Make sure you: If present, leave stitches (sutures), skin glue, or adhesive strips in place. These skin closures may need to stay in place for up to 2 weeks. If adhesive strip edges start to loosen and curl up, you may trim the loose edges. Do not remove adhesive strips completely unless your health care provider tells you to do that. If a large square bandage is present, this may be removed 24 hours after surgery.  Check your puncture site every day for signs of infection. Check for: Redness, swelling, or pain. Fluid or blood. If your puncture site starts to bleed, lie down on your back, apply firm pressure to the area, and contact your health care provider. Warmth. Pus or a bad smell. Driving Do not drive for at least 4 days after your procedure or however long your health care provider recommends. (Do not resume driving if you have previously been instructed not to drive for other health reasons.) Do not drive or use heavy machinery while taking prescription pain medicine. Activity Avoid activities that take a lot of effort for at least 7 days after your procedure. Do not lift anything that is heavier than 5 lb (4.5 kg) for one week.  No sexual activity for 1 week.  Return to your normal activities as told by your health care provider. Ask your health care provider what activities are safe for you. General instructions Take over-the-counter and prescription medicines only as told by your health care  provider. Do not use any products that contain nicotine or tobacco, such as cigarettes and e-cigarettes. If you need help quitting, ask your health care provider. You may shower after 24 hours, but Do not take baths, swim, or use a hot tub for 1 week.  Do not drink alcohol for 24 hours after your procedure. Keep all follow-up visits as told by your health care provider. This  is important. Contact a health care provider if: You have redness, mild swelling, or pain around your puncture site. You have fluid or blood coming from your puncture site that stops after applying firm pressure to the area. Your puncture site feels warm to the touch. You have pus or a bad smell coming from your puncture site. You have a fever. You have chest pain or discomfort that spreads to your neck, jaw, or arm. You are sweating a lot. You feel nauseous. You have a fast or irregular heartbeat. You have shortness of breath. You are dizzy or light-headed and feel the need to lie down. You have pain or numbness in the arm or leg closest to your puncture site. Get help right away if: Your puncture site suddenly swells. Your puncture site is bleeding and the bleeding does not stop after applying firm pressure to the area. These symptoms may represent a serious problem that is an emergency. Do not wait to see if the symptoms will go away. Get medical help right away. Call your local emergency services (911 in the [place] Do not drive yourself to the hospital. Summary After the procedure, it is normal to have bruising and tenderness at the puncture site in your groin, neck, or forearm. Check your puncture site every day for signs of infection. Get help right away if your puncture site is bleeding and the bleeding does not stop after applying firm pressure to the area. This is a medical emergency. This information is not intended to replace advice given to you by your health care provider. Make sure you discuss any questions you have with your health care provider.

## 2021-08-31 NOTE — Progress Notes (Addendum)
1510 ambulated pt to the restroom, post left groin dressing partially saturated. Laid pt flat removed dressing, no further bleeding, site redresses, called Dr Elberta Fortis, pt to lay 30 more minutes and attempt discharge again.  1600 re ambulated, no further signs of bleeding, pt ready for discharge.

## 2021-09-01 ENCOUNTER — Encounter (HOSPITAL_COMMUNITY): Payer: Self-pay | Admitting: Cardiology

## 2021-09-09 ENCOUNTER — Ambulatory Visit (HOSPITAL_COMMUNITY): Payer: Medicare HMO

## 2021-09-28 ENCOUNTER — Encounter (HOSPITAL_COMMUNITY): Payer: Self-pay | Admitting: Physician Assistant

## 2021-09-28 ENCOUNTER — Ambulatory Visit (HOSPITAL_COMMUNITY)
Admission: RE | Admit: 2021-09-28 | Discharge: 2021-09-28 | Disposition: A | Discharge status: Home / Self Care | Payer: Medicare HMO | Source: Ambulatory Visit | Attending: Physician Assistant | Admitting: Physician Assistant

## 2021-09-28 ENCOUNTER — Other Ambulatory Visit: Payer: Self-pay | Admitting: Cardiovascular Disease

## 2021-09-28 ENCOUNTER — Other Ambulatory Visit: Payer: Self-pay

## 2021-09-28 VITALS — BP 118/82 | HR 80 | Ht 74.5 in | Wt 155.0 lb

## 2021-09-28 DIAGNOSIS — I5022 Chronic systolic (congestive) heart failure: Secondary | ICD-10-CM | POA: Diagnosis not present

## 2021-09-28 DIAGNOSIS — Z7901 Long term (current) use of anticoagulants: Secondary | ICD-10-CM | POA: Insufficient documentation

## 2021-09-28 DIAGNOSIS — Z87891 Personal history of nicotine dependence: Secondary | ICD-10-CM | POA: Insufficient documentation

## 2021-09-28 DIAGNOSIS — Z8249 Family history of ischemic heart disease and other diseases of the circulatory system: Secondary | ICD-10-CM | POA: Insufficient documentation

## 2021-09-28 DIAGNOSIS — D6869 Other thrombophilia: Secondary | ICD-10-CM

## 2021-09-28 DIAGNOSIS — I4819 Other persistent atrial fibrillation: Secondary | ICD-10-CM

## 2021-09-28 NOTE — Telephone Encounter (Signed)
Prescription refill request for Eliquis received. Indication:Afib Last office visit:11/22 Scr:1.3 Age: 70 Weight:70.3 kg  Prescription refilled

## 2021-09-28 NOTE — Progress Notes (Signed)
Primary Care Physician: Patient, No Pcp Per (Inactive) Primary Cardiologist: Dr Royann Shivers Primary Electrophysiologist: Dr Elberta Fortis  Referring Physician: Dr Comer Locket is a 70 y.o. adult with a history of HFrEF, atrial fibrillation, CKD who presents for consultation in the [facility].  The patient was initially diagnosed with atrial fibrillation 08/2019. He was hospitalized with acute CHF and afib with RVR. He was started on amiodarone which converted him to SR. He had done well as an outpatient and his amiodarone was eventually discontinued. Patient is on Eliquis for a CHADS2VASC score of 2. He was found to be back in afib at follow up with Dr Royann Shivers on 06/03/21. He underwent DCCV on 06/16/21 but was back in afib about 2 days later, per his smart watch.   On follow up today, patient is s/p afib ablation with Dr Elberta Fortis on 08/31/21. Patient reports that he has done well since the procedure with no afib noted on his smart watch. He denies CP, swallowing pain, or groin issues.   Today, he denies symptoms of palpitations, chest pain, shortness of breath, orthopnea, PND, lower extremity edema, dizziness, presyncope, syncope, snoring, daytime somnolence, bleeding, or neurologic sequela. The patient is tolerating medications without difficulties and is otherwise without complaint today.    Atrial Fibrillation Risk Factors:  he does not have symptoms or diagnosis of sleep apnea. he does not have a history of rheumatic fever. he does not have a history of alcohol use.   he has a BMI of Body mass index is 19.63 kg/m.Marland Kitchen Filed Weights   09/28/21 1124  Weight: 70.3 kg    Family History  Problem Relation Age of Onset   Heart disease Mother    Atrial fibrillation Father    Aneurysm Father      Atrial Fibrillation Management history:  Previous antiarrhythmic drugs: amiodarone  Previous cardioversions: 06/16/21 Previous ablations:  08/31/21 CHADS2VASC score: 2 Anticoagulation history: Eliquis   Past Medical History:  Diagnosis Date   Arthritis    Hepatitis    hx of 35 years ago    Past Surgical History:  Procedure Laterality Date   ATRIAL FIBRILLATION ABLATION N/A 08/31/2021   Procedure: ATRIAL FIBRILLATION ABLATION;  Surgeon: Regan Lemming, MD;  Location: [facility] INVASIVE CV LAB;  Service: Cardiovascular;  Laterality: N/A;   CARDIOVERSION N/A 06/16/2021   Procedure: CARDIOVERSION;  Surgeon: Chilton Si, MD;  Location: [facility] ENDOSCOPY;  Service: Cardiovascular;  Laterality: N/A;   TOTAL HIP ARTHROPLASTY Left 06/25/2015   Procedure: LEFT TOTAL HIP ARTHROPLASTY ANTERIOR APPROACH;  Surgeon: Kathryne Hitch, MD;  Location: [facility] ORS;  Service: Orthopedics;  Laterality: Left;    Current Outpatient Medications  Medication Sig Dispense Refill   carvedilol (COREG) 25 MG tablet Take 1.5 tablets (37.5 mg total) by mouth 2 (two) times daily with a meal. 270 tablet 3   ELIQUIS 5 MG TABS tablet Take 1 tablet by mouth twice daily 180 tablet 1   losartan (COZAAR) 25 MG tablet Take 0.5 tablets (12.5 mg total) by mouth daily.     Lysine 500 MG TABS Take 500 mg by mouth daily.     magnesium 30 MG tablet Take 400 mg by mouth daily.     Misc Natural Products (URINOZINC PO) Take 1 tablet by mouth daily.     Multiple Vitamin (MULTIVITAMIN WITH MINERALS) TABS tablet Take 1 tablet by mouth daily. 50 +     Multiple Vitamins-Minerals (EQ VISION FORMULA 50+ PO) Take  1 tablet by mouth daily.     Turmeric 400 MG CAPS Take 400 mg by mouth daily. With flax seed oil and fish oil combination     furosemide (LASIX) 20 MG tablet Take 1 tablet (20 mg total) by mouth daily as needed for edema. 30 tablet 1   No current facility-administered medications for this encounter.    No Known Allergies  Social History   Socioeconomic History   Marital status: Married    Spouse name: Not on file   Number of children: Not on file   Years of  education: Not on file   Highest education level: Not on file  Occupational History   Not on file  Tobacco Use   Smoking status: Former   Smokeless tobacco: Former    Types: Chew   Tobacco comments:    Former smoker and Chew 09/28/2021  Substance and Sexual Activity   Alcohol use: No   Drug use: No   Sexual activity: Not on file  Other Topics Concern   Not on file  Social History Narrative   Not on file   Social Determinants of Health   Financial Resource Strain: Not on file  Food Insecurity: Not on file  Transportation Needs: Not on file  Physical Activity: Not on file  Stress: Not on file  Social Connections: Not on file  Intimate Partner Violence: Not on file     ROS- All systems are reviewed and negative except as per the HPI above.  Physical Exam: Vitals:   09/28/21 1124  BP: 118/82  Pulse: 80  Weight: 70.3 kg  Height: 6' 2.5" (1.892 m)   GEN- The patient is a well appearing male, alert and oriented x 3 today.   HEENT-head normocephalic, atraumatic, sclera clear, conjunctiva pink, hearing intact, trachea midline. Lungs- Clear to ausculation bilaterally, normal work of breathing Heart- Regular rate and rhythm, no murmurs, rubs or gallops  GI- soft, NT, ND, + BS Extremities- no clubbing, cyanosis, or edema MS- no significant deformity or atrophy Skin- no rash or lesion Psych- euthymic mood, full affect Neuro- strength and sensation are intact   Wt Readings from Last 3 Encounters:  09/28/21 70.3 kg  08/31/21 74.8 kg  07/28/21 70.7 kg    EKG today demonstrates  SR, PACs Vent. rate 80 BPM PR interval 192 ms QRS duration 98 ms QT/QTcB 368/424 ms  Echo 05/02/21 demonstrated  1. Left ventricular ejection fraction, by estimation, is 25 to 30%. The  left ventricle has severely decreased function. The left ventricle  demonstrates global hypokinesis. The left ventricular internal cavity size was mildly dilated. Left ventricular diastolic parameters are  consistent with Grade III diastolic dysfunction (restrictive).   2. Right ventricular systolic function is normal. The right ventricular  size is normal.   3. The mitral valve is normal in structure. Moderate mitral valve  regurgitation.   4. The aortic valve is normal in structure. Aortic valve regurgitation is  trivial. No aortic stenosis is present.   Comparison(s): 01/29/20 EF 35-40%.   Epic records are reviewed at length today  CHA2DS2-VASc Score = 2  The patient's score is based upon: CHF History: 1 HTN History: 0 Diabetes History: 0 Stroke History: 0 Vascular Disease History: 0 Age Score: 1 Gender Score: 0     ASSESSMENT AND PLAN: 1. Persistent Atrial Fibrillation (ICD10:  I48.19) The patient's CHA2DS2-VASc score is 2, indicating a 2.2% annual risk of stroke.   S/p DCCV 06/16/21 with early return of afib. S/p  afib ablation 08/31/21 Patient appears to be maintaining SR.  Continue Eliquis 5 mg BID with no missed doses for 3 months post ablation.  Continue carvedilol 37.5 mg BID  2. Secondary Hypercoagulable State (ICD10:  D68.69) The patient is at significant risk for stroke/thromboembolism based upon his CHA2DS2-VASc Score of 2.  Continue Apixaban (Eliquis).   3. Chronic systolic CHF No signs or symptoms of fluid overload.    Follow up with Dr Curt Bears as scheduled.    [facility] [address] [place], [place] [address] [phone] 09/28/2021 12:04 PM

## 2021-09-30 NOTE — Telephone Encounter (Signed)
Pt reports that he is doing much better with no issues since we last spoke. No further dizziness which he thinks may have been r/t illness at the time. Will keep monitoring for now and will let us know if reoccurs. Patient verbalized understanding and agreeable to plan.

## 2021-11-30 ENCOUNTER — Other Ambulatory Visit: Payer: Self-pay

## 2021-11-30 ENCOUNTER — Encounter: Payer: Self-pay | Admitting: Cardiology

## 2021-11-30 ENCOUNTER — Ambulatory Visit: Payer: Medicare HMO | Admitting: Cardiology

## 2021-11-30 VITALS — BP 126/78 | HR 77 | Ht 74.5 in | Wt 155.6 lb

## 2021-11-30 DIAGNOSIS — I4819 Other persistent atrial fibrillation: Secondary | ICD-10-CM | POA: Diagnosis not present

## 2021-11-30 DIAGNOSIS — I5022 Chronic systolic (congestive) heart failure: Secondary | ICD-10-CM | POA: Diagnosis not present

## 2021-11-30 NOTE — Patient Instructions (Signed)
Medication Instructions:  Your physician recommends that you continue on your current medications as directed. Please refer to the Current Medication list given to you today.  *If you need a refill on your cardiac medications before your next appointment, please call your pharmacy*   Lab Work: None ordered If you have labs (blood work) drawn today and your tests are completely normal, you will receive your results only by: [place] (if you have MyChart) OR A paper copy in the mail If you have any lab test that is abnormal or we need to change your treatment, we will call you to review the results.   Testing/Procedures: Your physician has requested that you have an echocardiogram. Echocardiography is a painless test that uses sound waves to create images of your heart. It provides your doctor with information about the size and shape of your heart and how well your hearts chambers and valves are working. This procedure takes approximately one hour. There are no restrictions for this procedure.    Follow-Up: At [facility], you and your health needs are our priority.  As part of our continuing mission to provide you with exceptional heart care, we have created designated Provider Care Teams.  These Care Teams include your primary Cardiologist (physician) and Advanced Practice Providers (APPs -  Physician Assistants and Nurse Practitioners) who all work together to provide you with the care you need, when you need it.   Your next appointment:   3 month(s)  The format for your next appointment:   In Person  Provider:   Allegra Lai, MD    Thank you for choosing [facility]!!   Trinidad Curet, RN [phone]   Other Instructions  Left Atrial Appendage Closure Device Implantation Left atrial appendage (LAA) closure device implantation is a procedure to put a small device in the LAA of the heart. The LAA is a small sac in the wall of the heart's left upper chamber.  Blood clots can form in the LAA in people with atrial fibrillation (AFib). The device closes the LAA to help prevent a blood clot and stroke. AFib is a type of irregular or rapid heartbeat (arrhythmia). There is an increased risk of blood clots and stroke with AFib. This procedure helps to reduce that risk. Tell a health care provider about: Any allergies you have. All medicines you are taking, including vitamins, herbs, eye drops, creams, and over-the-counter medicines. Any problems you or family members have had with anesthetic medicines. Any blood disorders you have. Any surgeries you have had. Any medical conditions you have. Whether you are pregnant or may be pregnant. What are the risks? Generally, this is a safe procedure. However, problems may occur, including: Infection. Bleeding. Allergic reactions to medicines or dyes. Damage to nearby structures or organs. Heart attack. Stroke. Blood clots. Changes in heart rhythm. Device failure. What happens before the procedure? Staying hydrated Follow instructions from your health care provider about hydration, which may include: Up to 2 hours before the procedure - you may continue to drink clear liquids, such as water, clear fruit juice, black coffee, and plain tea. Eating and drinking restrictions Follow instructions from your health care provider about eating and drinking, which may include: 8 hours before the procedure - stop eating heavy meals or foods, such as meat, fried foods, or fatty foods. 6 hours before the procedure - stop eating light meals or foods, such as toast or cereal. 6 hours before the procedure - stop drinking milk or drinks  that contain milk. 2 hours before the procedure - stop drinking clear liquids. Medicines Ask your health care provider about: Changing or stopping your regular medicines. This is especially important if you are taking diabetes medicines or blood thinners. Taking medicines such as aspirin  and ibuprofen. These medicines can thin your blood. Do not take these medicines unless your health care provider tells you to take them. Taking over-the-counter medicines, vitamins, herbs, and supplements. Tests You may have blood tests and a physical exam. You may have an electrocardiogram (ECG). This test checks your heart's electrical patterns and rhythms. General instructions Do not use any products that contain nicotine or tobacco. These include cigarettes, chewing tobacco, and vaping devices, such as e-cigarettes. If you need help quitting, ask your health care provider. Ask your health care provider what steps will be taken to help prevent infection. These steps may include: Removing hair at the surgery site. Washing skin with a germ-killing soap. Taking antibiotic medicine. Plan to have a responsible adult take you home from the hospital or clinic. Plan to have a responsible adult care for you for the time you are told after you leave the hospital or clinic. This is important. What happens during the procedure? An IV will be inserted into one of your veins. You will be given one or more of the following: A medicine to help you relax (sedative). A medicine to make you fall asleep (general anesthetic). A small incision will be made in your groin area. A small wire will be put through the incision and into a blood vessel. Dye may be injected so X-rays can be used to guide the wire through the blood vessel. A long, thin tube (catheter) will be put over the small wire and moved up through the blood vessel to reach your heart. The closure device will be moved through the catheter until it reaches your heart. A small hole will be made in the septum (transseptal puncture). The septum is a thin tissue that separates the upper two chambers of the heart. The device will be placed so that it closes the LAA. X-rays will be done to make sure the device is in the right place. The catheter and wire  will be removed. The closure device will remain in your heart. After pressure is applied over the catheter site to prevent bleeding, a bandage (dressing) will be placed over the site where the catheter was inserted. The procedure may vary among health care providers and hospitals. What happens after the procedure? Your blood pressure, heart rate, breathing rate, and blood oxygen level will be monitored until you leave the hospital or clinic. You may have to wear compression stockings. These stockings help to prevent blood clots and reduce swelling in your legs. If you were given a sedative during the procedure, it can affect you for several hours. Do not drive or operate machinery until your health care provider says it is safe. You may be given pain medicine. You may need to drink more fluids to wash (flush) the dye out of your body. Drink enough fluid to keep your urine pale yellow. Take over-the-counter and prescription medicines only as told by your health care provider. This is especially important if you were given blood thinners. Summary Left atrial appendage (LAA) closure device implantation is a procedure that is done to put a small device in the LAA of the heart. The LAA is a small sac in the wall of the heart's left upper chamber. The device closes  the LAA to prevent stroke and other problems. Follow instructions from your health care provider before and after the procedure. This information is not intended to replace advice given to you by your health care provider. Make sure you discuss any questions you have with your health care provider. Document Revised: 07/15/2020 Document Reviewed: 07/15/2020 Elsevier Patient Education  [employer].

## 2021-11-30 NOTE — Progress Notes (Signed)
Electrophysiology Office Note   Date:  11/30/2021   ID:  Michael Noble, DOB [birth year], MRN [id number]  PCP:  Patient, No Pcp Per (Inactive)  Cardiologist:  [facility] Primary Electrophysiologist:  Zamaya Rapaport Jorja Loa, MD    Chief Complaint: AF   History of Present Illness: Michael Noble is a 71 y.o. adult who is being seen today for the evaluation of AF at the request of No ref. provider found. Presenting today for electrophysiology evaluation.  He has a history significant for chronic systolic heart failure, atrial fibrillation, CKD.  He was diagnosed with atrial fibrillation in 2020.  He was hospitalized for acute heart failure and atrial fibrillation with rapid rates.  He was started on amiodarone and cardioverted.  He had done well and amiodarone was discontinued.  He was back in atrial fibrillation.  He is now status post ablation 08/31/2021.  Today, denies symptoms of palpitations, chest pain, shortness of breath, orthopnea, PND, lower extremity edema, claudication, dizziness, presyncope, syncope, bleeding, or neurologic sequela. The patient is tolerating medications without difficulties.  Since his ablation he has done well.  He is noted no further episodes of atrial fibrillation.  He is able to exert himself without issue.  He has been working for the last 30 days without issue.  He has no major complaints at this time.   Past Medical History:  Diagnosis Date   Arthritis    Hepatitis    hx of 35 years ago    Past Surgical History:  Procedure Laterality Date   ATRIAL FIBRILLATION ABLATION N/A 08/31/2021   Procedure: ATRIAL FIBRILLATION ABLATION;  Surgeon: Regan Lemming, MD;  Location: [facility] INVASIVE CV LAB;  Service: Cardiovascular;  Laterality: N/A;   CARDIOVERSION N/A 06/16/2021   Procedure: CARDIOVERSION;  Surgeon: Chilton Si, MD;  [facility] ENDOSCOPY;  Service: Cardiovascular;  Laterality: N/A;   TOTAL HIP ARTHROPLASTY Left 06/25/2015   Procedure: LEFT  TOTAL HIP ARTHROPLASTY ANTERIOR APPROACH;  Surgeon: Kathryne Hitch, MD;  Location: [facility] ORS;  Service: Orthopedics;  Laterality: Left;     Current Outpatient Medications  Medication Sig Dispense Refill   apixaban (ELIQUIS) 5 MG TABS tablet Take 1 tablet by mouth twice daily 180 tablet 1   carvedilol (COREG) 25 MG tablet Take 1.5 tablets (37.5 mg total) by mouth 2 (two) times daily with a meal. 270 tablet 3   furosemide (LASIX) 20 MG tablet Take 1 tablet (20 mg total) by mouth daily as needed for edema. 30 tablet 1   losartan (COZAAR) 25 MG tablet Take 0.5 tablets (12.5 mg total) by mouth daily. (Patient taking differently: Take 12.5 mg by mouth every other day.)     Lysine 500 MG TABS Take 500 mg by mouth daily.     magnesium 30 MG tablet Take 400 mg by mouth daily.     Misc Natural Products (URINOZINC PO) Take 1 tablet by mouth daily.     Multiple Vitamin (MULTIVITAMIN WITH MINERALS) TABS tablet Take 1 tablet by mouth daily. 50 +     Multiple Vitamins-Minerals (EQ VISION FORMULA 50+ PO) Take 1 tablet by mouth daily.     Turmeric 400 MG CAPS Take 400 mg by mouth daily. With flax seed oil and fish oil combination     No current facility-administered medications for this visit.    Allergies:   Patient has no known allergies.   Social History:  The patient  reports that he has quit smoking. He has quit using smokeless tobacco.  His smokeless tobacco use included chew. He reports that he does not drink alcohol and does not use drugs.   Family History:  The patient's family history includes Aneurysm in his father; Atrial fibrillation in his father; Heart disease in his mother.   ROS:  Please see the history of present illness.   Otherwise, review of systems is positive for none.   All other systems are reviewed and negative.   PHYSICAL EXAM: VS:  BP 126/78    Pulse 77    Ht 6' 2.5" (1.892 m)    Wt 155 lb 9.6 oz (70.6 kg)    SpO2 98%    BMI 19.71 kg/m  , BMI Body mass index is 19.71  kg/m. GEN: Well nourished, well developed, in no acute distress  HEENT: normal  Neck: no JVD, carotid bruits, or masses Cardiac: RRR; no murmurs, rubs, or gallops,no edema  Respiratory:  clear to auscultation bilaterally, normal work of breathing GI: soft, nontender, nondistended, + BS MS: no deformity or atrophy  Skin: warm and dry Neuro:  Strength and sensation are intact Psych: euthymic mood, full affect  EKG:  EKG is ordered today. Personal review of the ekg ordered shows sinus rhythm, rate 77  Recent Labs: 08/23/2021: BUN 38; Creatinine, Ser 1.39; Hemoglobin 13.0; Platelets 148; Potassium 4.9; Sodium 138    Lipid Panel     Component Value Date/Time   CHOL 83 09/04/2019 0223   TRIG 41 09/04/2019 0223   HDL 23 (L) 09/04/2019 0223   CHOLHDL 3.6 09/04/2019 0223   VLDL 8 09/04/2019 0223   LDLCALC 52 09/04/2019 0223     Wt Readings from Last 3 Encounters:  11/30/21 155 lb 9.6 oz (70.6 kg)  09/28/21 155 lb (70.3 kg)  08/31/21 165 lb (74.8 kg)      Other studies Reviewed: Additional studies/ records that were reviewed today include: TTE 05/02/21  Review of the above records today demonstrates:   1. Left ventricular ejection fraction, by estimation, is 25 to 30%. The  left ventricle has severely decreased function. The left ventricle  demonstrates global hypokinesis. The left ventricular internal cavity size  was mildly dilated. Left ventricular  diastolic parameters are consistent with Grade III diastolic dysfunction  (restrictive).   2. Right ventricular systolic function is normal. The right ventricular  size is normal.   3. The mitral valve is normal in structure. Moderate mitral valve  regurgitation.   4. The aortic valve is normal in structure. Aortic valve regurgitation is  trivial. No aortic stenosis is present.   ASSESSMENT AND PLAN:  1.  Persistent atrial fibrillation: CHA2DS2-VASc of 2.  Currently on Eliquis 5 mg twice daily, carvedilol 37.5 mg twice  daily.  He has now status post ablation 08/31/2021.  He is feeling well without further episodes of atrial fibrillation.  He is overall happy with his control.  We Michael Noble continue with current management.  He is interested in [place].  We Michael Noble give him information and discuss it at the next visit.  2.  Chronic systolic heart failure: Potentially due to a tachycardia mediated cardiomyopathy.  Currently on losartan 12.5 mg daily, carvedilol 37.5 mg twice daily.  Now that he is back in sinus rhythm, we Michael Noble repeat his echo to ensure that his heart function has normalized.  Current medicines are reviewed at length with the patient today.   The patient does not have concerns regarding his medicines.  The following changes were made today: None  Labs/ tests ordered today  include:  Orders Placed This Encounter  Procedures   EKG 12-Lead   ECHOCARDIOGRAM COMPLETE     Disposition:   FU with Michael Noble 3 months  Signed, Garret Teale Meredith Leeds, MD  11/30/2021 12:23 PM     [place] [address] [place] [address] [phone] (office) [phone] (fax)

## 2021-12-01 ENCOUNTER — Ambulatory Visit: Payer: Medicare HMO | Admitting: Cardiology

## 2021-12-09 ENCOUNTER — Other Ambulatory Visit (HOSPITAL_COMMUNITY): Payer: Medicare HMO

## 2021-12-23 ENCOUNTER — Other Ambulatory Visit: Payer: Self-pay

## 2021-12-23 ENCOUNTER — Ambulatory Visit (HOSPITAL_COMMUNITY): Payer: Medicare HMO | Attending: Cardiology

## 2021-12-23 DIAGNOSIS — I4819 Other persistent atrial fibrillation: Secondary | ICD-10-CM | POA: Diagnosis not present

## 2021-12-23 LAB — ECHOCARDIOGRAM COMPLETE
Area-P 1/2: 3.65 cm2
P 1/2 time: 313 msec
S' Lateral: 4 cm

## 2022-02-17 ENCOUNTER — Other Ambulatory Visit: Payer: Self-pay

## 2022-02-17 MED ORDER — APIXABAN 5 MG PO TABS: 5.0000 mg | ORAL_TABLET | Freq: Two times a day (BID) | ORAL | 1 refills | Status: DC

## 2022-02-17 NOTE — Telephone Encounter (Signed)
Prescription refill request for Eliquis received. ?Indication:Afib ?Last office visit:7/22 ?Scr:1.3 ?Age: 72 ?Weight:70.6 kg ? ?Prescription refilled ? ?

## 2022-03-23 ENCOUNTER — Ambulatory Visit: Payer: Medicare HMO | Admitting: Cardiology

## 2022-03-30 ENCOUNTER — Encounter: Payer: Self-pay | Admitting: *Deleted

## 2022-03-30 ENCOUNTER — Telehealth: Payer: Self-pay | Admitting: *Deleted

## 2022-03-30 MED ORDER — ENTRESTO 24-26 MG PO TABS: 1.0000 | ORAL_TABLET | Freq: Two times a day (BID) | ORAL | 1 refills | Status: DC

## 2022-03-30 NOTE — Telephone Encounter (Signed)
Followed up with pt about this.  Pt never was called about this. ?Informed of recommendation. ?Discussed Entresto and why ordered. ?Advised to stop Losartan, start Entresto 24/26 mg BID. ?Aware information about this medication will be sent to him via mychart. ?Aware nurse will follow up weeks later, if tolerating will increase dose. ?Will arrange lab work if tolerating. ?Advised to call back if cost issue, and/or SE begin after starting. ?Patient verbalized understanding and agreeable to plan.  ? ?

## 2022-03-30 NOTE — Telephone Encounter (Signed)
Michael Meredith Leeds, MD  ?01/01/2022  3:18 PM EST   ?  ?EF remains low despite normal sinus rhythm. Stop losartan and start entresto with pharmacy follow up.  ? ?

## 2022-04-04 NOTE — Telephone Encounter (Signed)
Dr. Elberta Fortis, please advise.  With such low BPs should pt see pharmD to discuss before starting even low dose Entresto? ?

## 2022-04-12 NOTE — Telephone Encounter (Signed)
Will set up an appointment

## 2022-04-12 NOTE — Telephone Encounter (Signed)
Pt informed that Dr. Curt Bears recommends he follow up with Dr. Sallyanne Kuster to discuss treatment plan and work on maxium medical therapy for heart failure Delene Loll not started d/t pt baseline low bps) Aware that his office will reach out to get pt scheduled (will forward to nurse). Aware we may have to move [place] appt in June but will await to see when he is scheduled w/ [facility].  Informed that [facility] will also arrange for him to have a follow up echo after medication change, if any, to then determine if ICD discussion is needed. Patient verbalized understanding and agreeable to plan.

## 2022-05-15 ENCOUNTER — Ambulatory Visit: Payer: Medicare HMO | Admitting: Cardiology

## 2022-05-19 ENCOUNTER — Encounter: Payer: Self-pay | Admitting: Cardiovascular Disease

## 2022-05-19 ENCOUNTER — Ambulatory Visit: Payer: Medicare HMO | Admitting: Cardiovascular Disease

## 2022-05-19 VITALS — BP 110/62 | HR 98 | Ht 74.0 in | Wt 151.8 lb

## 2022-05-19 DIAGNOSIS — I5022 Chronic systolic (congestive) heart failure: Secondary | ICD-10-CM

## 2022-05-19 DIAGNOSIS — D6869 Other thrombophilia: Secondary | ICD-10-CM | POA: Diagnosis not present

## 2022-05-19 DIAGNOSIS — N1831 Chronic kidney disease, stage 3a: Secondary | ICD-10-CM

## 2022-05-19 DIAGNOSIS — I493 Ventricular premature depolarization: Secondary | ICD-10-CM

## 2022-05-19 DIAGNOSIS — I4819 Other persistent atrial fibrillation: Secondary | ICD-10-CM | POA: Diagnosis not present

## 2022-05-19 NOTE — Progress Notes (Signed)
Cardiology Office Note:    Date:  05/20/2022   ID:  Michael Noble, DOB 1950/11/30, MRN [id number]  PCP:  Patient, No Pcp Per  [place] Cardiologist:  Sanda Klein, MD  [place] Electrophysiologist:  Will Meredith Leeds, MD   Referring MD: No ref. provider found   Chief Complaint  Patient presents with   Congestive Heart Failure          History of Present Illness:    Michael Noble is a 71 y.o. adult with a hx of HFrEF and persistent AFIb, presenting with RVR and LVEF 21% in October 2020.  (No angina, normal nuclear study, no obvious coronary stenoses at non-dedicated cardiac CT preablation).  Following initiation of heart failure therapy he has had a progressive improvement in left ventricular systolic function with  LVEF 35-40% by echocardiogram 04/30/2020, but with a repeat drop in EF to 25-30% by echo in 05/02/2021 during recurrent AFib/RVR. After ablation of AFib, EF was 30-35% in February 2023.  He does not think he has had any A-fib since the ablation, but he has never been aware of the arrhythmia.  He is in sinus rhythm today with frequent PVCs but no PACs.  He is compliant with anticoagulation and has not had any bleeding problems on Eliquis.  Denies edema, orthopnea/PND, exertional dyspnea, chest pain, dizziness or syncope.  We have tried to increase his heart failure medications, but when placed on losartan 50 mg daily he developed severe symptomatic hypotension (80/50).  He feels well on the 25 mg dose.  Unable to tolerate Entresto.  He is on high-dose carvedilol.  Initial attempts to start treatment with angiotensin receptor blocker were unsuccessful due to worsening renal parameters (creatinine increased from 1.3-1.94 and he had borderline hyperkalemia) and symptomatic hypotension.  Most recent creatinine was 1.39 on 08/23/2021 (improved).  Maximum tolerated dose of losartan was 25 mg daily.  He has an excellent LDL cholesterol at 52 but has a low HDL 23  (last checked October 2020)  Past Medical History:  Diagnosis Date   Arthritis    Hepatitis    hx of 35 years ago     Past Surgical History:  Procedure Laterality Date   ATRIAL FIBRILLATION ABLATION N/A 08/31/2021   Procedure: ATRIAL FIBRILLATION ABLATION;  Surgeon: Constance Haw, MD;  Location: [place] CV LAB;  Service: Cardiovascular;  Laterality: N/A;   CARDIOVERSION N/A 06/16/2021   Procedure: CARDIOVERSION;  Surgeon: Skeet Latch, MD;  Location: [place];  Service: Cardiovascular;  Laterality: N/A;   TOTAL HIP ARTHROPLASTY Left 06/25/2015   Procedure: LEFT TOTAL HIP ARTHROPLASTY ANTERIOR APPROACH;  Surgeon: Mcarthur Rossetti, MD;  Location: [facility] ORS;  Service: Orthopedics;  Laterality: Left;    Current Medications: Current Meds  Medication Sig   apixaban (ELIQUIS) 5 MG TABS tablet Take 1 tablet (5 mg total) by mouth 2 (two) times daily.   carvedilol (COREG) 25 MG tablet Take 1.5 tablets (37.5 mg total) by mouth 2 (two) times daily with a meal.   furosemide (LASIX) 20 MG tablet Take 1 tablet (20 mg total) by mouth daily as needed for edema.   Lysine 500 MG TABS Take 500 mg by mouth daily.   magnesium 30 MG tablet Take 400 mg by mouth daily.   Misc Natural Products (URINOZINC PO) Take 1 tablet by mouth daily.   Multiple Vitamin (MULTIVITAMIN WITH MINERALS) TABS tablet Take 1 tablet by mouth daily. 50 +   Multiple Vitamins-Minerals (EQ VISION FORMULA 50+ PO)  Take 1 tablet by mouth daily.   sacubitril-valsartan (ENTRESTO) 24-26 MG Take 1 tablet by mouth 2 (two) times daily.   Turmeric 400 MG CAPS Take 400 mg by mouth daily. With flax seed oil and fish oil combination     Allergies:   Patient has no known allergies.   Social History   Socioeconomic History   Marital status: Married    Spouse name: Not on file   Number of children: Not on file   Years of education: Not on file   Highest education level: Not on file  Occupational History   Not on file   Tobacco Use   Smoking status: Former   Smokeless tobacco: Former    Types: Chew   Tobacco comments:    Former smoker and Chew 09/28/2021  Substance and Sexual Activity   Alcohol use: No   Drug use: No   Sexual activity: Not on file  Other Topics Concern   Not on file  Social History Narrative   Not on file   Social Determinants of Health   Financial Resource Strain: Not on file  Food Insecurity: Not on file  Transportation Needs: Not on file  Physical Activity: Not on file  Stress: Not on file  Social Connections: Not on file     Family History: The patient's family history includes Aneurysm in his father; Atrial fibrillation in his father; Heart disease in his mother.  ROS:   Please see the history of present illness.     All other systems reviewed and are negative.  EKGs/Labs/Other Studies Reviewed:    The following studies were reviewed today: Echocardiogram 12/23/2021    1. Systolic function is mildly improved compared with the echo 04/2021.   2. Left ventricular ejection fraction, by estimation, is 30 to 35%. The  left ventricle has moderately decreased function. The left ventricle  demonstrates global hypokinesis. Left ventricular diastolic parameters are  consistent with Grade II diastolic  dysfunction (pseudonormalization).   3. Right ventricular systolic function is normal. The right ventricular  size is normal. There is normal pulmonary artery systolic pressure.   4. The mitral valve is normal in structure. Mild mitral valve  regurgitation. No evidence of mitral stenosis.   5. The aortic valve is normal in structure. Aortic valve regurgitation is  mild to moderate. No aortic stenosis is present.   6. The inferior vena cava is dilated in size with <50% respiratory  variability, suggesting right atrial pressure of 15 mmHg.   EKG: EKG is ordered today and shows atrial fibrillation with borderline ventricular rate control at 100 bpm and occasional  aberrantly conducted beats.  QTc 451 ms.  Recent Labs: 08/23/2021: BUN 38; Creatinine, Ser 1.39; Hemoglobin 13.0; Platelets 148; Potassium 4.9; Sodium 138  Recent Lipid Panel    Component Value Date/Time   CHOL 83 09/04/2019 0223   TRIG 41 09/04/2019 0223   HDL 23 (L) 09/04/2019 0223   CHOLHDL 3.6 09/04/2019 0223   VLDL 8 09/04/2019 0223   LDLCALC 52 09/04/2019 0223    Physical Exam:    VS:  BP 110/62   Pulse 98   Ht 6\' 2"  (1.88 m)   Wt 151 lb 12.8 oz (68.9 kg)   SpO2 99%   BMI 19.49 kg/m     Wt Readings from Last 3 Encounters:  05/19/22 151 lb 12.8 oz (68.9 kg)  11/30/21 155 lb 9.6 oz (70.6 kg)  09/28/21 155 lb (70.3 kg)      General: Alert,  oriented x3, no distress, very lean Head: no evidence of trauma, PERRL, EOMI, no exophtalmos or lid lag, no myxedema, no xanthelasma; normal ears, nose and oropharynx Neck: normal jugular venous pulsations and no hepatojugular reflux; brisk carotid pulses without delay and no carotid bruits Chest: clear to auscultation, no signs of consolidation by percussion or palpation, normal fremitus, symmetrical and full respiratory excursions Cardiovascular: normal position and quality of the apical impulse, regular rhythm, normal first and second heart sounds, no murmurs, rubs or gallops Abdomen: no tenderness or distention, no masses by palpation, no abnormal pulsatility or arterial bruits, normal bowel sounds, no hepatosplenomegaly Extremities: no clubbing, cyanosis or edema; 2+ radial, ulnar and brachial pulses bilaterally; 2+ right femoral, posterior tibial and dorsalis pedis pulses; 2+ left femoral, posterior tibial and dorsalis pedis pulses; no subclavian or femoral bruits Neurological: grossly nonfocal Psych: Normal mood and affect    ASSESSMENT:    1. Chronic systolic (congestive) heart failure (HCC)   2. Persistent atrial fibrillation (HCC)   3. PVCs (premature ventricular contractions)   4. Acquired thrombophilia (HCC)   5.  Stage 3a chronic kidney disease (HCC)      PLAN:    In order of problems listed above:  CHF: NYHA class I, euvolemic without diuretics. It is apparent that he has underlying cardiomyopathy not caused by the atrial fibrillation, with periods of exacerbation/worsening LV dysfunction due to the atrial fibrillation.  Rate control is therefore very important.  He was unable to tolerate Entresto or higher doses of ARB (and I doubt he would do better with spironolactone).  He has striking atrophy of the thenar muscles in both hands and temporal muscle atrophy, which makes me wonder about a more systemic process such as a skeletal and cardiac myopathy.  There is no family history of genetic myopathy or heart failure.    I have previously offered referral for genetic testing, but he is not interested in this.  Could use SGLT2 inhibitor, but he is already struggling with the "doughnut hole" cost of Eliquis (and did not qualify for assistance). Recheck LVEF in 6 months. AFib: S/P successful ablation. Has a rescheduled appointment with Dr. Elberta Fortis next month. Compliant with Eliquis.  CHA2DS2-VASc 2 (age, HF). PVCs:  frequent on today's ECG. Asymptomatic. On high dose carvedilol. Anticoagulation: No bleeding problems. Rx cost is a problem. CKD 3a: stable.  Medication Adjustments/Labs and Tests Ordered: Current medicines are reviewed at length with the patient today.  Concerns regarding medicines are outlined above.  Orders Placed This Encounter  Procedures   EKG 12-Lead   ECHOCARDIOGRAM COMPLETE    No orders of the defined types were placed in this encounter.    Patient Instructions  Medication Instructions:  No changes *If you need a refill on your cardiac medications before your next appointment, please call your pharmacy*   Lab Work: None ordered If you have labs (blood work) drawn today and your tests are completely normal, you will receive your results only by: MyChart Message (if you have  MyChart) OR A paper copy in the mail If you have any lab test that is abnormal or we need to change your treatment, we will call you to review the results.   Testing/Procedures: Your physician has requested that you have an echocardiogram in October. Echocardiography is a painless test that uses sound waves to create images of your heart. It provides your doctor with information about the size and shape of your heart and how well your heart's chambers and valves  are working. You may receive an ultrasound enhancing agent through an IV if needed to better visualize your heart during the echo.This procedure takes approximately one hour. There are no restrictions for this procedure. This will take place at the [address].     Follow-Up: At [facility], you and your health needs are our priority.  As part of our continuing mission to provide you with exceptional heart care, we have created designated Provider Care Teams.  These Care Teams include your primary Cardiologist (physician) and Advanced Practice Providers (APPs -  Physician Assistants and Nurse Practitioners) who all work together to provide you with the care you need, when you need it.  We recommend signing up for the patient portal called "MyChart".  Sign up information is provided on this After Visit Summary.  MyChart is used to connect with patients for Virtual Visits (Telemedicine).  Patients are able to view lab/test results, encounter notes, upcoming appointments, etc.  Non-urgent messages can be sent to your provider as well.   To learn more about what you can do with MyChart, go to [email or web address].    Your next appointment:   6 month(s)   The format for your next appointment:   In Person  Provider:   Thurmon Fair, MD {    Important Information About Sugar         Signed, Thurmon Fair, MD  05/20/2022 5:01 PM    [facility]

## 2022-05-19 NOTE — Patient Instructions (Signed)
Medication Instructions:  No changes *If you need a refill on your cardiac medications before your next appointment, please call your pharmacy*   Lab Work: None ordered If you have labs (blood work) drawn today and your tests are completely normal, you will receive your results only by: MyChart Message (if you have MyChart) OR A paper copy in the mail If you have any lab test that is abnormal or we need to change your treatment, we will call you to review the results.   Testing/Procedures: Your physician has requested that you have an echocardiogram in October. Echocardiography is a painless test that uses sound waves to create images of your heart. It provides your doctor with information about the size and shape of your heart and how well your heart's chambers and valves are working. You may receive an ultrasound enhancing agent through an IV if needed to better visualize your heart during the echo.This procedure takes approximately one hour. There are no restrictions for this procedure. This will take place at the [address].     Follow-Up: At [facility], you and your health needs are our priority.  As part of our continuing mission to provide you with exceptional heart care, we have created designated Provider Care Teams.  These Care Teams include your primary Cardiologist (physician) and Advanced Practice Providers (APPs -  Physician Assistants and Nurse Practitioners) who all work together to provide you with the care you need, when you need it.  We recommend signing up for the patient portal called "MyChart".  Sign up information is provided on this After Visit Summary.  MyChart is used to connect with patients for Virtual Visits (Telemedicine).  Patients are able to view lab/test results, encounter notes, upcoming appointments, etc.  Non-urgent messages can be sent to your provider as well.   To learn more about what you can do with MyChart, go to  [email or web address].    Your next appointment:   6 month(s)   The format for your next appointment:   In Person  Provider:   Thurmon Fair, MD {    Important Information About Sugar

## 2022-06-13 ENCOUNTER — Other Ambulatory Visit: Payer: Self-pay | Admitting: Cardiovascular Disease

## 2022-07-14 ENCOUNTER — Encounter: Payer: Self-pay | Admitting: Cardiology

## 2022-07-14 ENCOUNTER — Ambulatory Visit: Payer: Medicare HMO | Admitting: Cardiology

## 2022-07-14 VITALS — BP 122/88 | HR 62 | Ht 74.0 in | Wt 152.0 lb

## 2022-07-14 DIAGNOSIS — I5022 Chronic systolic (congestive) heart failure: Secondary | ICD-10-CM

## 2022-07-14 DIAGNOSIS — D6869 Other thrombophilia: Secondary | ICD-10-CM

## 2022-07-14 DIAGNOSIS — I4819 Other persistent atrial fibrillation: Secondary | ICD-10-CM

## 2022-07-14 NOTE — Progress Notes (Signed)
Electrophysiology Office Note   Date:  07/14/2022   ID:  Michael Noble, DOB 1951/06/13, MRN [id number]  PCP:  Patient, No Pcp Per  Cardiologist:  [place] Primary Electrophysiologist:  Brandin Dilday Jorja Loa, MD    Chief Complaint: AF   History of Present Illness: Michael Noble is a 71 y.o. adult who is being seen today for the evaluation of AF at the request of No ref. provider found. Presenting today for electrophysiology evaluation.  He has a history significant for chronic systolic heart failure, atrial fibrillation, CKD.  He was diagnosed with atrial fibrillation in 2020.  He is hospitalized for acute heart failure and rapid rates.  He is now post ablation 08/31/2021.  Today, denies symptoms of palpitations, chest pain, shortness of breath, orthopnea, PND, lower extremity edema, claudication, dizziness, presyncope, syncope, bleeding, or neurologic sequela. The patient is tolerating medications without difficulties.  Since being seen he has done well.  He remains to be quite active.  He works as a Nutritional therapist.  He is also been traveling, vacations to [place].    Past Medical History:  Diagnosis Date   Arthritis    Hepatitis    hx of 35 years ago    Past Surgical History:  Procedure Laterality Date   ATRIAL FIBRILLATION ABLATION N/A 08/31/2021   Procedure: ATRIAL FIBRILLATION ABLATION;  Surgeon: Regan Lemming, MD;  Location: [facility] INVASIVE CV LAB;  Service: Cardiovascular;  Laterality: N/A;   CARDIOVERSION N/A 06/16/2021   Procedure: CARDIOVERSION;  Surgeon: Chilton Si, MD;  Location: [facility] ENDOSCOPY;  Service: Cardiovascular;  Laterality: N/A;   TOTAL HIP ARTHROPLASTY Left 06/25/2015   Procedure: LEFT TOTAL HIP ARTHROPLASTY ANTERIOR APPROACH;  Surgeon: Kathryne Hitch, MD;  Location: [facility] ORS;  Service: Orthopedics;  Laterality: Left;     Current Outpatient Medications  Medication Sig Dispense Refill   apixaban (ELIQUIS) 5 MG TABS tablet Take 1 tablet (5 mg  total) by mouth 2 (two) times daily. 180 tablet 1   carvedilol (COREG) 25 MG tablet TAKE 1 TABLET BY MOUTH TWICE DAILY WITH MEALS 180 tablet 0   Lysine 500 MG TABS Take 500 mg by mouth daily.     magnesium 30 MG tablet Take 400 mg by mouth daily.     Misc Natural Products (URINOZINC PO) Take 1 tablet by mouth daily.     Multiple Vitamin (MULTIVITAMIN WITH MINERALS) TABS tablet Take 1 tablet by mouth daily. 50 +     Turmeric 400 MG CAPS Take 400 mg by mouth daily. With flax seed oil and fish oil combination     furosemide (LASIX) 20 MG tablet Take 1 tablet (20 mg total) by mouth daily as needed for edema. 30 tablet 1   sacubitril-valsartan (ENTRESTO) 24-26 MG Take 1 tablet by mouth 2 (two) times daily. (Patient not taking: Reported on 07/14/2022) 60 tablet 1   No current facility-administered medications for this visit.    Allergies:   Patient has no known allergies.   Social History:  The patient  reports that he has quit smoking. He has quit using smokeless tobacco.  His smokeless tobacco use included chew. He reports that he does not drink alcohol and does not use drugs.   Family History:  The patient's family history includes Aneurysm in his father; Atrial fibrillation in his father; Heart disease in his mother.   ROS:  Please see the history of present illness.   Otherwise, review of systems is positive for none.   All other systems  are reviewed and negative.   PHYSICAL EXAM: VS:  BP 122/88   Pulse 62   Ht 6\' 2"  (1.88 m)   Wt 152 lb (68.9 kg)   SpO2 99%   BMI 19.52 kg/m  , BMI Body mass index is 19.52 kg/m. GEN: Well nourished, well developed, in no acute distress  HEENT: normal  Neck: no JVD, carotid bruits, or masses Cardiac: RRR; no murmurs, rubs, or gallops,no edema  Respiratory:  clear to auscultation bilaterally, normal work of breathing GI: soft, nontender, nondistended, + BS MS: no deformity or atrophy  Skin: warm and dry Neuro:  Strength and sensation are  intact Psych: euthymic mood, full affect  EKG:  EKG is ordered today. Personal review of the ekg ordered shows sinus rhythm, PVCs  Recent Labs: 08/23/2021: BUN 38; Creatinine, Ser 1.39; Hemoglobin 13.0; Platelets 148; Potassium 4.9; Sodium 138    Lipid Panel     Component Value Date/Time   CHOL 83 09/04/2019 0223   TRIG 41 09/04/2019 0223   HDL 23 (L) 09/04/2019 0223   CHOLHDL 3.6 09/04/2019 0223   VLDL 8 09/04/2019 0223   LDLCALC 52 09/04/2019 0223     Wt Readings from Last 3 Encounters:  07/14/22 152 lb (68.9 kg)  05/19/22 151 lb 12.8 oz (68.9 kg)  11/30/21 155 lb 9.6 oz (70.6 kg)      Other studies Reviewed: Additional studies/ records that were reviewed today include: TTE 12/23/21  Review of the above records today demonstrates:   1. Systolic function is mildly improved compared with the echo 04/2021.   2. Left ventricular ejection fraction, by estimation, is 30 to 35%. The  left ventricle has moderately decreased function. The left ventricle  demonstrates global hypokinesis. Left ventricular diastolic parameters are  consistent with Grade II diastolic  dysfunction (pseudonormalization).   3. Right ventricular systolic function is normal. The right ventricular  size is normal. There is normal pulmonary artery systolic pressure.   4. The mitral valve is normal in structure. Mild mitral valve  regurgitation. No evidence of mitral stenosis.   5. The aortic valve is normal in structure. Aortic valve regurgitation is  mild to moderate. No aortic stenosis is present.   6. The inferior vena cava is dilated in size with <50% respiratory  variability, suggesting right atrial pressure of 15 mmHg.    ASSESSMENT AND PLAN:  1.  Persistent atrial fibrillation: CHA2DS2-VASc of 2.  Currently on Eliquis 5 mg twice daily, carvedilol 37.5 mg twice daily.  Post ablation 08/31/21.  He is remained in sinus rhythm.  We Ciarrah Rae continue with current management.  2.  Chronic [place] heart  failure: His ejection fraction remains low despite maintenance of sinus rhythm.  Currently off of medical therapy per primary cardiology.  He has a repeat echo scheduled for October.  If his ejection fraction remains low, he may be a candidate for ICD therapy.  3.  Secondary hypercoagulable state: Currently on Eliquis for atrial fibrillation as above   Current medicines are reviewed at length with the patient today.   The patient does not have concerns regarding his medicines.  The following changes were made today: None  Labs/ tests ordered today include:  Orders Placed This Encounter  Procedures   EKG 12-Lead     Disposition:   FU 6 months  Signed, Quinetta Shilling November, MD  07/14/2022 10:18 AM     [facility] [address] [place] [phone] (office) [phone] (fax)

## 2022-08-09 ENCOUNTER — Other Ambulatory Visit: Payer: Self-pay | Admitting: Cardiovascular Disease

## 2022-08-09 MED ORDER — FUROSEMIDE 20 MG PO TABS: 20.0000 mg | ORAL_TABLET | Freq: Every day | ORAL | 1 refills | Status: AC | PRN

## 2022-08-18 ENCOUNTER — Other Ambulatory Visit: Payer: Self-pay | Admitting: Cardiovascular Disease

## 2022-08-18 MED ORDER — CARVEDILOL 25 MG PO TABS: 25.0000 mg | ORAL_TABLET | Freq: Two times a day (BID) | ORAL | 3 refills | Status: DC

## 2022-08-28 ENCOUNTER — Ambulatory Visit (HOSPITAL_COMMUNITY): Payer: Medicare HMO | Attending: Cardiology

## 2022-08-28 DIAGNOSIS — I5022 Chronic systolic (congestive) heart failure: Secondary | ICD-10-CM | POA: Diagnosis not present

## 2022-08-28 LAB — ECHOCARDIOGRAM COMPLETE
Area-P 1/2: 3.87 cm2
S' Lateral: 4.8 cm

## 2022-08-29 ENCOUNTER — Encounter: Payer: Self-pay | Admitting: Cardiovascular Disease

## 2022-08-29 ENCOUNTER — Telehealth: Payer: Self-pay | Admitting: Cardiovascular Disease

## 2022-08-29 NOTE — Telephone Encounter (Signed)
Spoke with patient regarding how to take furosemide 20mg . He said he ususally takes furosemide once a month to prevent swelling. He stated he does not have any swelling or weight gain. Informed him to take furosemide 20mg  each day as needed for swelling. He verbalized understanding.

## 2022-08-29 NOTE — Telephone Encounter (Signed)
Patient was returning call. Please advise ?

## 2022-09-20 ENCOUNTER — Other Ambulatory Visit: Payer: Self-pay | Admitting: Cardiovascular Disease

## 2022-09-20 DIAGNOSIS — I4891 Unspecified atrial fibrillation: Secondary | ICD-10-CM

## 2022-09-20 NOTE — Telephone Encounter (Signed)
Prescription refill request for Eliquis received. Indication: Afib  Last office visit:07/14/22 ([place])  Scr: 1.39 (08/23/22)  Age: 71 Weight: 68.9kg  Appropriate dose and refill sent to requested pharmacy.

## 2022-11-23 ENCOUNTER — Encounter: Payer: Self-pay | Admitting: Cardiovascular Disease

## 2022-11-23 ENCOUNTER — Ambulatory Visit: Payer: Medicare HMO | Attending: Cardiovascular Disease | Admitting: Cardiovascular Disease

## 2022-11-23 VITALS — BP 130/82 | HR 92 | Ht 74.0 in | Wt 153.2 lb

## 2022-11-23 DIAGNOSIS — D6869 Other thrombophilia: Secondary | ICD-10-CM

## 2022-11-23 DIAGNOSIS — I5042 Chronic combined systolic (congestive) and diastolic (congestive) heart failure: Secondary | ICD-10-CM | POA: Diagnosis not present

## 2022-11-23 DIAGNOSIS — I4819 Other persistent atrial fibrillation: Secondary | ICD-10-CM

## 2022-11-23 DIAGNOSIS — N1831 Chronic kidney disease, stage 3a: Secondary | ICD-10-CM | POA: Diagnosis not present

## 2022-11-23 DIAGNOSIS — I493 Ventricular premature depolarization: Secondary | ICD-10-CM

## 2022-11-23 MED ORDER — VALSARTAN 80 MG PO TABS: 80.0000 mg | ORAL_TABLET | Freq: Every day | ORAL | 3 refills | Status: DC

## 2022-11-23 NOTE — Patient Instructions (Signed)
Medication Instructions:  START valsartan 80mg  daily   *If you need a refill on your cardiac medications before your next appointment, please call your pharmacy*   Lab Work: Non-Fasting labs in about 3 weeks -- BMET, BNP, CBC  If you have labs (blood work) drawn today and your tests are completely normal, you will receive your results only by: [place] (if you have MyChart) OR A paper copy in the mail If you have any lab test that is abnormal or we need to change your treatment, we will call you to review the results.   Testing/Procedures: Your physician has requested that you have an echocardiogram. Echocardiography is a painless test that uses sound waves to create images of your heart. It provides your doctor with information about the size and shape of your heart and how well your heart's chambers and valves are working. This procedure takes approximately one hour. There are no restrictions for this procedure. Please do NOT wear cologne, perfume, aftershave, or lotions (deodorant is allowed). Please arrive 15 minutes prior to your appointment time. This is due OCTOBER 2024   Follow-Up: At [facility], you and your health needs are our priority.  As part of our continuing mission to provide you with exceptional heart care, we have created designated Provider Care Teams.  These Care Teams include your primary Cardiologist (physician) and Advanced Practice Providers (APPs -  Physician Assistants and Nurse Practitioners) who all work together to provide you with the care you need, when you need it.  We recommend signing up for the patient portal called "MyChart".  Sign up information is provided on this After Visit Summary.  MyChart is used to connect with patients for Virtual Visits (Telemedicine).  Patients are able to view lab/test results, encounter notes, upcoming appointments, etc.  Non-urgent messages can be sent to your provider as well.   To learn more about what  you can do with MyChart, go to [email or web address].    Your next appointment:    October 2024 with Dr. Sallyanne Kuster ** call in April/May for this appointment

## 2022-11-23 NOTE — Progress Notes (Signed)
Cardiology Office Note:    Date:  11/25/2022   ID:  Michael Noble, DOB 10-Nov-1951, MRN [id number]  PCP:  Patient, No Pcp Per  [place] Cardiologist:  Sanda Klein, MD  [place] Electrophysiologist:  Will Meredith Leeds, MD   Referring MD: No ref. provider found   Chief Complaint  Patient presents with   Congestive Heart Failure     History of Present Illness:    Michael Noble is a 72 y.o. adult with a hx of HFrEF and persistent AFIb, presenting with RVR and LVEF 21% in October 2020.  (No angina, normal nuclear study, no obvious coronary stenoses at non-dedicated cardiac CT preablation).  Following initiation of heart failure therapy he has had a progressive improvement in left ventricular systolic function with  LVEF 35-40% by echocardiogram 04/30/2020, but with a repeat drop in EF to 25-30% by echo in 05/02/2021 during recurrent AFib/RVR.   After ablation of AFib, EF was 30-35% in February 2023.  He has not had any detected atrial fibrillation since then.  He has never been aware of the arrhythmia, but his watch has not shown any evidence of elevated heart rate.  He is in a bigeminal rhythm today.  Repeat echocardiogram in October 2023 still shows depressed LVEF, albeit slightly improved at 35-40%.  He feels well.  NYHA functional class I.  Has easy bruising but has not had any serious bleeding or falls or injuries.  Denies orthopnea, PND, lower extremity edema.  No chest pain at rest or with activity.  Was unable to tolerate Entresto and developed symptomatic hypotension with  losartan 50 mg once daily.  Today his blood pressure is substantially higher at 130/82.  He is on maximum usual dose of carvedilol.  He has not taken a loop diuretic in months.   He is on high-dose carvedilol.  Initial attempts to start treatment with angiotensin receptor blocker were unsuccessful due to worsening renal parameters (creatinine increased from 1.3-1.94 and he had borderline  hyperkalemia) and symptomatic hypotension.  Most recent creatinine was 1.39 on 08/23/2021 (improved).  Maximum tolerated dose of losartan was 25 mg daily.  He has an excellent LDL cholesterol at 52 but has a low HDL 23 (last checked October 2020)  Past Medical History:  Diagnosis Date   Arthritis    Hepatitis    hx of 35 years ago     Past Surgical History:  Procedure Laterality Date   ATRIAL FIBRILLATION ABLATION N/A 08/31/2021   Procedure: ATRIAL FIBRILLATION ABLATION;  Surgeon: Constance Haw, MD;  Location: [place] CV LAB;  Service: Cardiovascular;  Laterality: N/A;   CARDIOVERSION N/A 06/16/2021   Procedure: CARDIOVERSION;  Surgeon: Skeet Latch, MD;  Location: [place];  Service: Cardiovascular;  Laterality: N/A;   TOTAL HIP ARTHROPLASTY Left 06/25/2015   Procedure: LEFT TOTAL HIP ARTHROPLASTY ANTERIOR APPROACH;  Surgeon: Mcarthur Rossetti, MD;  Location: [facility] ORS;  Service: Orthopedics;  Laterality: Left;    Current Medications: Current Meds  Medication Sig   apixaban (ELIQUIS) 5 MG TABS tablet Take 1 tablet by mouth twice daily   carvedilol (COREG) 25 MG tablet Take 1 tablet (25 mg total) by mouth 2 (two) times daily with a meal.   Lysine 500 MG TABS Take 500 mg by mouth daily.   magnesium 30 MG tablet Take 400 mg by mouth daily.   Misc Natural Products (URINOZINC PO) Take 1 tablet by mouth daily.   Multiple Vitamin (MULTIVITAMIN WITH MINERALS) TABS tablet Take 1  tablet by mouth daily. 50 +   Turmeric 400 MG CAPS Take 400 mg by mouth daily. With flax seed oil and fish oil combination   valsartan (DIOVAN) 80 MG tablet Take 1 tablet (80 mg total) by mouth daily.     Allergies:   Patient has no known allergies.   Social History   Socioeconomic History   Marital status: Married    Spouse name: Not on file   Number of children: Not on file   Years of education: Not on file   Highest education level: Not on file  Occupational History   Not on file   Tobacco Use   Smoking status: Former   Smokeless tobacco: Former    Types: Chew   Tobacco comments:    Former smoker and Chew 09/28/2021  Substance and Sexual Activity   Alcohol use: No   Drug use: No   Sexual activity: Not on file  Other Topics Concern   Not on file  Social History Narrative   Not on file   Social Determinants of Health   Financial Resource Strain: Not on file  Food Insecurity: Not on file  Transportation Needs: Not on file  Physical Activity: Not on file  Stress: Not on file  Social Connections: Not on file     Family History: The patient's family history includes Aneurysm in his father; Atrial fibrillation in his father; Heart disease in his mother.  ROS:   Please see the history of present illness.     All other systems reviewed and are negative.  EKGs/Labs/Other Studies Reviewed:    The following studies were reviewed today: Echocardiogram 08/28/2022    1. Left ventricular ejection fraction, by estimation, is 35 to 40%. The  left ventricle has moderately decreased function. The left ventricle  demonstrates global hypokinesis. Left ventricular diastolic parameters are  consistent with Grade II diastolic  dysfunction (pseudonormalization).   2. Right ventricular systolic function is normal. The right ventricular  size is normal.   3. The mitral valve is normal in structure. Mild mitral valve  regurgitation. No evidence of mitral stenosis.   4. The aortic valve is tricuspid. Aortic valve regurgitation is mild to  moderate. No aortic stenosis is present.   5. The inferior vena cava is normal in size with greater than 50%  respiratory variability, suggesting right atrial pressure of 3 mmHg.    EKG: EKG is not ordered today.  The tracing from 07/14/2022 is personally reviewed and shows sinus rhythm with occasional PVCs.  Recent Labs: No results found for requested labs within last 365 days.  Recent Lipid Panel    Component Value Date/Time    CHOL 83 09/04/2019 0223   TRIG 41 09/04/2019 0223   HDL 23 (L) 09/04/2019 0223   CHOLHDL 3.6 09/04/2019 0223   VLDL 8 09/04/2019 0223   LDLCALC 52 09/04/2019 0223    Physical Exam:    VS:  BP 130/82   Pulse 92   Ht 6\' 2"  (1.88 m)   Wt 153 lb 3.2 oz (69.5 kg)   SpO2 99%   BMI 19.67 kg/m     Wt Readings from Last 3 Encounters:  11/23/22 153 lb 3.2 oz (69.5 kg)  07/14/22 152 lb (68.9 kg)  05/19/22 151 lb 12.8 oz (68.9 kg)      General: Alert, oriented x3, no distress, very lean Head: no evidence of trauma, PERRL, EOMI, no exophtalmos or lid lag, no myxedema, no xanthelasma; normal ears, nose and oropharynx  Neck: normal jugular venous pulsations and no hepatojugular reflux; brisk carotid pulses without delay and no carotid bruits Chest: clear to auscultation, no signs of consolidation by percussion or palpation, normal fremitus, symmetrical and full respiratory excursions Cardiovascular: normal position and quality of the apical impulse, regular geminal rhythm, normal first and second heart sounds, no murmurs, rubs or gallops Abdomen: no tenderness or distention, no masses by palpation, no abnormal pulsatility or arterial bruits, normal bowel sounds, no hepatosplenomegaly Extremities: no clubbing, cyanosis or edema; 2+ radial, ulnar and brachial pulses bilaterally; 2+ right femoral, posterior tibial and dorsalis pedis pulses; 2+ left femoral, posterior tibial and dorsalis pedis pulses; no subclavian or femoral bruits Neurological: grossly nonfocal Psych: Normal mood and affect     ASSESSMENT:    1. Chronic combined systolic and diastolic heart failure (HCC)   2. Persistent atrial fibrillation (HCC)   3. PVCs (premature ventricular contractions)   4. Acquired thrombophilia (HCC)   5. Stage 3a chronic kidney disease (HCC)       PLAN:    In order of problems listed above:  CHF: NYHA class I, euvolemic without diuretics.  Although he did have a component of tachycardia  cardiomyopathy, clearly there is another underlying reason for his heart failure.  Rate control is therefore very important.  Will try once more to introduce a very low-dose of ARB.  He was unable to tolerate Entresto or higher doses of ARB (and I doubt he would do better with spironolactone).  He has striking atrophy of the thenar muscles in both hands and temporal muscle atrophy, which makes me wonder about a more systemic process such as a skeletal and cardiac myopathy.  There is no family history of genetic myopathy or heart failure.    I have previously offered referral for genetic testing, but he is not interested in this.  Could use SGLT2 inhibitor, but he is already struggling with the "doughnut hole" cost of Eliquis (and did not qualify for assistance).  Recheck labs after we start the valsartan. AFib: S/P successful ablation.  No clinically evident recurrence.  Compliant with Eliquis.  CHA2DS2-VASc 2 (age, HF). PVCs: Bigeminal rhythm today. Asymptomatic. On high dose carvedilol. Anticoagulation: No bleeding problems.  Medication cost is a problem CKD 3a: stable.  Recheck labs in a few weeks  Medication Adjustments/Labs and Tests Ordered: Current medicines are reviewed at length with the patient today.  Concerns regarding medicines are outlined above.  Orders Placed This Encounter  Procedures   CBC   Basic metabolic panel   Brain natriuretic peptide   ECHOCARDIOGRAM COMPLETE    Meds ordered this encounter  Medications   valsartan (DIOVAN) 80 MG tablet    Sig: Take 1 tablet (80 mg total) by mouth daily.    Dispense:  90 tablet    Refill:  3     Patient Instructions  Medication Instructions:  START valsartan 80mg  daily   *If you need a refill on your cardiac medications before your next appointment, please call your pharmacy*   Lab Work: Non-Fasting labs in about 3 weeks -- BMET, BNP, CBC  If you have labs (blood work) drawn today and your tests are completely normal, you  will receive your results only by: MyChart Message (if you have MyChart) OR A paper copy in the mail If you have any lab test that is abnormal or we need to change your treatment, we will call you to review the results.   Testing/Procedures: Your physician has requested that you  have an echocardiogram. Echocardiography is a painless test that uses sound waves to create images of your heart. It provides your doctor with information about the size and shape of your heart and how well your heart's chambers and valves are working. This procedure takes approximately one hour. There are no restrictions for this procedure. Please do NOT wear cologne, perfume, aftershave, or lotions (deodorant is allowed). Please arrive 15 minutes prior to your appointment time. This is due OCTOBER 2024   Follow-Up: At [facility], you and your health needs are our priority.  As part of our continuing mission to provide you with exceptional heart care, we have created designated Provider Care Teams.  These Care Teams include your primary Cardiologist (physician) and Advanced Practice Providers (APPs -  Physician Assistants and Nurse Practitioners) who all work together to provide you with the care you need, when you need it.  We recommend signing up for the patient portal called "MyChart".  Sign up information is provided on this After Visit Summary.  MyChart is used to connect with patients for Virtual Visits (Telemedicine).  Patients are able to view lab/test results, encounter notes, upcoming appointments, etc.  Non-urgent messages can be sent to your provider as well.   To learn more about what you can do with MyChart, go to [email or web address].    Your next appointment:    October 2024 with Dr. Royann Shivers ** call in April/May for this appointment         Signed, Thurmon Fair, MD  11/25/2022 11:08 AM    [facility]

## 2022-11-25 ENCOUNTER — Encounter: Payer: Self-pay | Admitting: Cardiovascular Disease

## 2022-12-01 ENCOUNTER — Encounter: Payer: Self-pay | Admitting: Cardiovascular Disease

## 2022-12-01 NOTE — Telephone Encounter (Signed)
No need to repeat the labs. Please stop the valsartan. Same side effects as with the other meds that we have tried.

## 2022-12-05 ENCOUNTER — Other Ambulatory Visit: Payer: Self-pay

## 2022-12-05 NOTE — Progress Notes (Signed)
Valsartan discontinued by Dr.Croitoru due to pt reporting dizziness.

## 2022-12-05 NOTE — Telephone Encounter (Signed)
Med list updated with change.

## 2022-12-10 ENCOUNTER — Encounter: Payer: Self-pay | Admitting: Cardiovascular Disease

## 2022-12-11 NOTE — Telephone Encounter (Signed)
Thanks. No changes.

## 2022-12-29 ENCOUNTER — Encounter: Payer: Self-pay | Admitting: Cardiovascular Disease

## 2023-01-16 ENCOUNTER — Encounter: Payer: Self-pay | Admitting: Cardiovascular Disease

## 2023-01-16 DIAGNOSIS — I4891 Unspecified atrial fibrillation: Secondary | ICD-10-CM

## 2023-01-17 MED ORDER — APIXABAN 5 MG PO TABS: 5.0000 mg | ORAL_TABLET | Freq: Two times a day (BID) | ORAL | 3 refills | Status: AC

## 2023-02-07 IMAGING — CT CT HEART MORPH/PULM VEIN W/ CM & W/O CA SCORE
2 of 6 series · 12 of 20 positions shown, 14 images · non-contrast
Comparison: None.
COMPARISON: None.

Addendum:
EXAM:
OVER-READ INTERPRETATION  CT CHEST

The following report is an over-read performed by radiologist Dr.
Temhem Wi [REDACTED] on 08/29/2021. This
over-read does not include interpretation of cardiac or coronary
anatomy or pathology. The coronary calcium score/coronary CTA
interpretation by the cardiologist is attached.
CLINICAL DATA: Atrial fibrillation scheduled for ablation.
Cardiac CTA
TECHNIQUE: A non-contrast, gated CT scan was obtained with axial slices of 3 mm
through the heart for calcium scoring. Calcium scoring was performed
using the Agatston method. A 120 kV retrospective, gated, contrast
cardiac scan was obtained. Gantry rotation speed was 250 msecs and
collimation was 0.6 mm. Nitroglycerin was not given. A delayed scan
was obtained to exclude left atrial appendage thrombus. The 3D
dataset was reconstructed in 5% intervals of the 0-95% of the R-R
cycle. Late systolic phases were analyzed on a dedicated workstation
using MPR, MIP, and VRT modes. The patient received 80 cc of
contrast.

[Series 10: 0-90% · axial · 0.39mm/px · z∈[+1243,+1362]mm · 6 of 3330 slices shown, 8 images]
[im 476/3330  vessel]
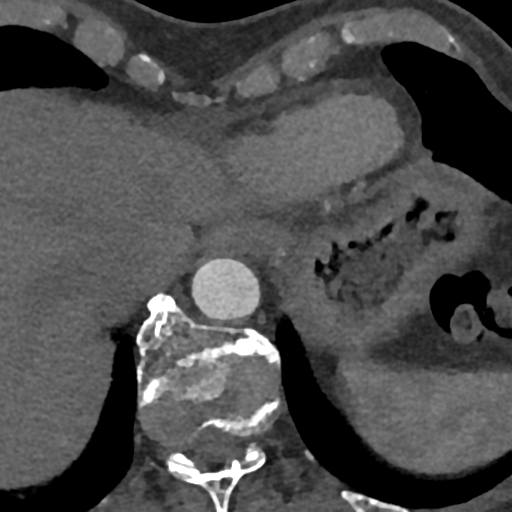
[im 476/3330  lung]
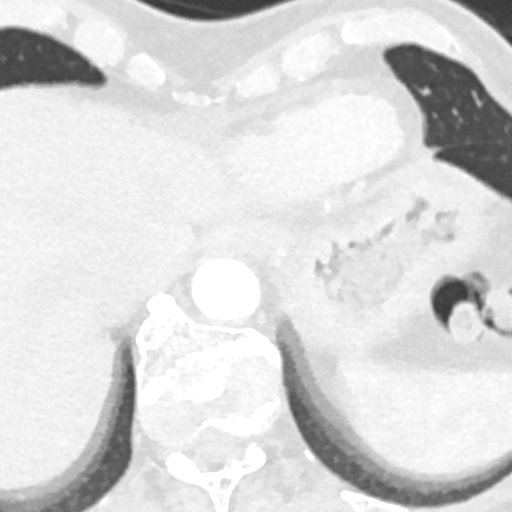
[im 952/3330  vessel]
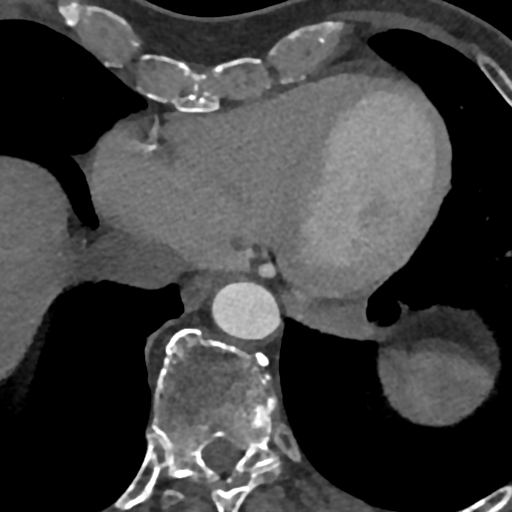
[im 1427/3330  vessel]
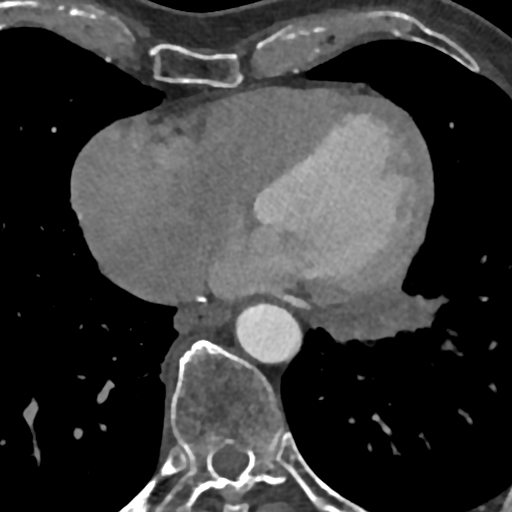
[im 1903/3330  vessel]
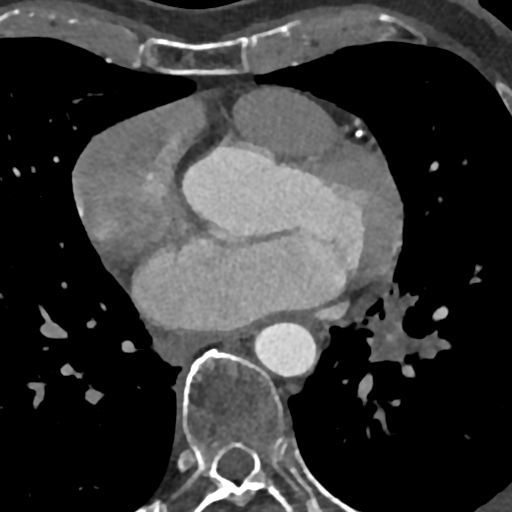
[im 2378/3330  vessel]
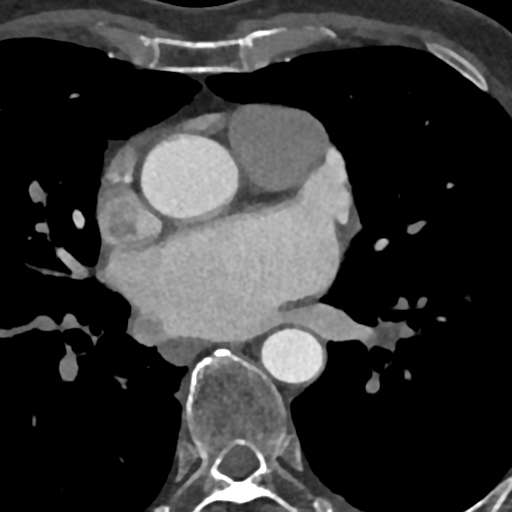
[im 2378/3330  lung]
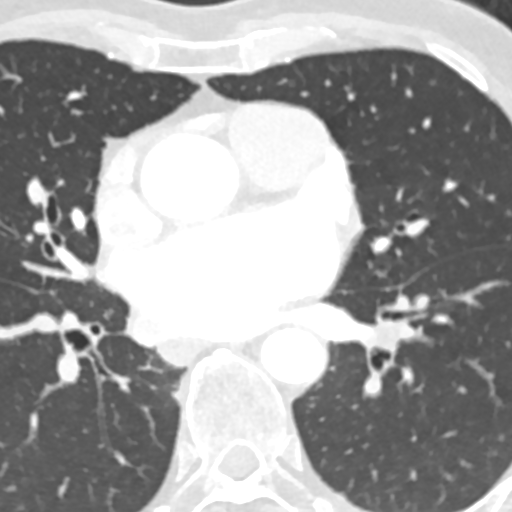
[im 2854/3330  vessel]
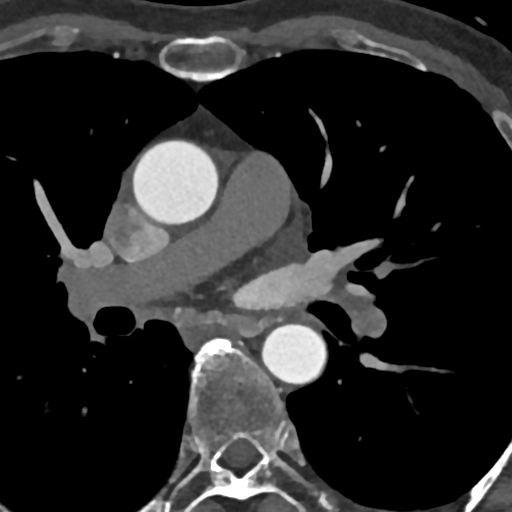

[Series 15: 5-95% · axial · 0.70mm/px · z∈[+1243,+1362]mm · 6 of 3330 slices shown]
[im 476/3330  vessel]
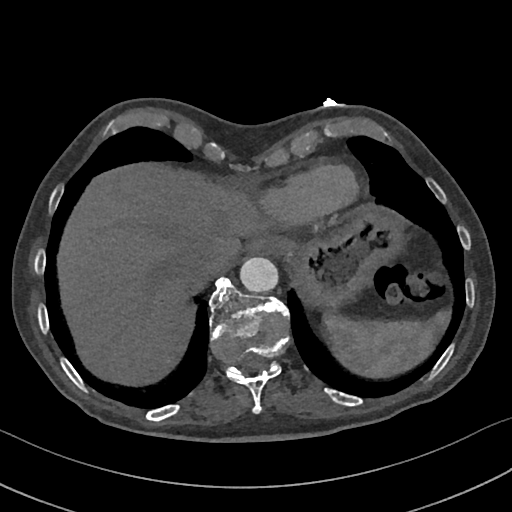
[im 952/3330  vessel]
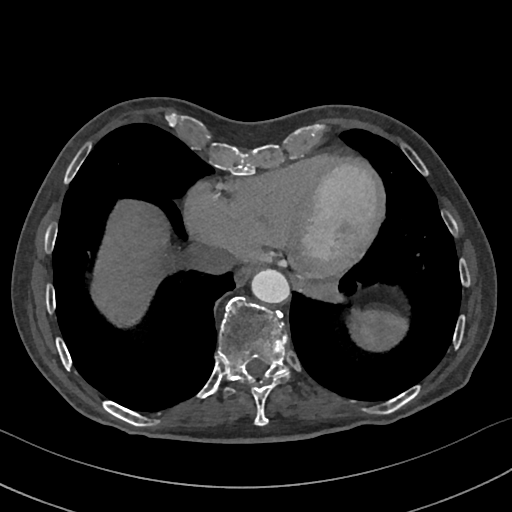
[im 1427/3330  vessel]
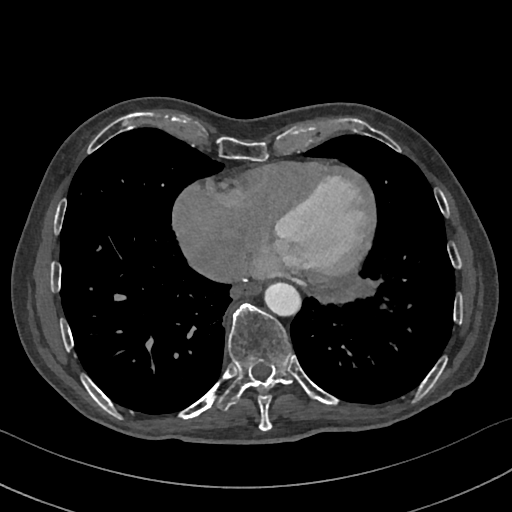
[im 1903/3330  vessel]
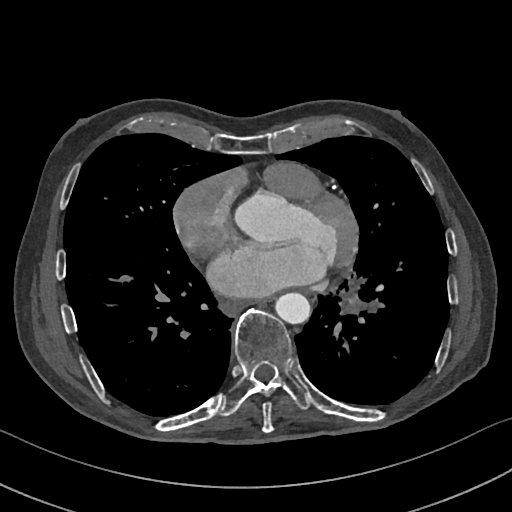
[im 2378/3330  vessel]
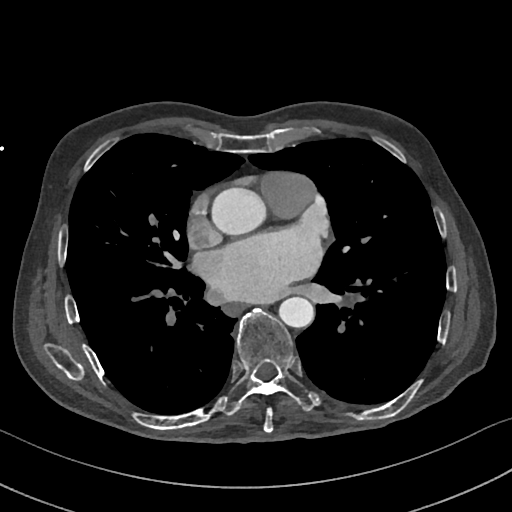
[im 2854/3330  vessel]
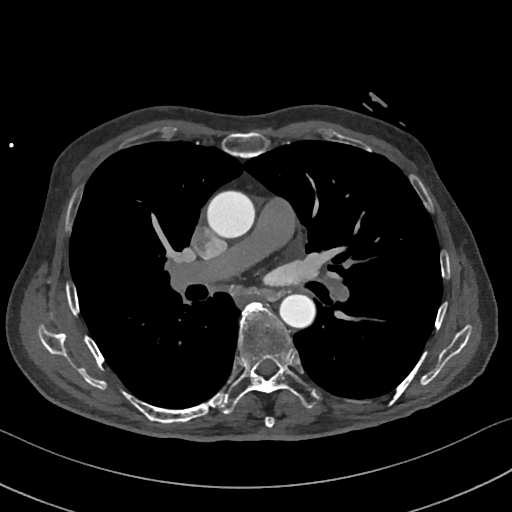

[12 of 20 positions shown; findings below may reference images not displayed]

FINDINGS: Central airspace consolidation in the anteromedial aspect of the
left lower lobe, concerning for pneumonia. Several prominent but
nonenlarged left hilar and infrahilar lymph nodes are noted, likely
reactive. Within the visualized portions of the thorax there are no
suspicious appearing pulmonary nodules or masses, no pleural
effusions, no pneumothorax and no lymphadenopathy. Visualized
portions of the upper abdomen are unremarkable. There are no
aggressive appearing lytic or blastic lesions noted in the
visualized portions of the skeleton.
IMPRESSION: 1. Airspace consolidation in the anteromedial aspect of the left
lower lobe, concerning for pneumonia. Standing PA and lateral chest
radiograph is recommended in 2-3 weeks to ensure resolution of this
finding.
FINDINGS: Image quality: Average.

Noise artifact is: Limited. (signal-to-noise
(obese)/beam/movement/mis-registration).

Pulmonary Veins: There is normal pulmonary vein drainage into the
left atrium (2 on the right and 2 on the left) with ostial
measurements as follows:

RUPV: Ostium 22.6mm x 18.9mm  area 0.47cm4

RLPV:  Ostium 22.5mm x 16.7mm  area 9.25cm9

LUPV:  Ostium 15.8mm x 9.37mm area 6.48cm4

LLPV:  Ostium 16.2mm x 8.09mm  area 4.844cm4

Left Atrium: The left atrial size is normal. There is no PFO/ASD.
The left atrial appendage is large broccoli type with two lobes and
ostial size mm and length mm. There is no thrombus in the left
atrial appendage on contrast or delayed imaging. The esophagus runs
in the left atrial midline and is not in proximity to any of the
pulmonary vein ostia.

Coronary Arteries: CAC score of 355, which is 70th percentile for
age-, race-, and sex-matched controls. Normal coronary origin. Right
dominance. The study was performed without use of NTG and is
insufficient for plaque evaluation.

Right Atrium: Right atrial size is within normal limits.

Right Ventricle: The right ventricular cavity is within normal
limits.

Left Ventricle: The ventricular cavity size is within normal limits.
There are no stigmata of prior infarction. There is no abnormal
filling defect.

Pericardium: Normal thickness with no significant effusion or
calcium present.

Pulmonary Artery: Normal caliber without proximal filling defect.

Cardiac valves: The aortic valve is trileaflet without significant
calcification. The mitral valve is normal structure without
significant calcification.

Aorta: Normal caliber with no significant disease.

Extra-cardiac findings: See attached radiology report for
non-cardiac structures.
IMPRESSION: 1. There is normal pulmonary vein drainage into the left atrium with
ostial measurements above.

2. There is no thrombus in the left atrial appendage.

3. The esophagus runs in the left atrial midline and is not in
proximity to any of the pulmonary vein ostia.

4. No PFO/ASD.

5. Normal coronary origin. Right dominance.

6. CAC score of 0 which is 0 percentile for age-, race-, and
sex-matched controls.

*** End of Addendum ***
EXAM:
OVER-READ INTERPRETATION  CT CHEST

The following report is an over-read performed by radiologist Dr.
Temhem Wi [REDACTED] on 08/29/2021. This
over-read does not include interpretation of cardiac or coronary
anatomy or pathology. The coronary calcium score/coronary CTA
interpretation by the cardiologist is attached.
FINDINGS: Central airspace consolidation in the anteromedial aspect of the
left lower lobe, concerning for pneumonia. Several prominent but
nonenlarged left hilar and infrahilar lymph nodes are noted, likely
reactive. Within the visualized portions of the thorax there are no
suspicious appearing pulmonary nodules or masses, no pleural
effusions, no pneumothorax and no lymphadenopathy. Visualized
portions of the upper abdomen are unremarkable. There are no
aggressive appearing lytic or blastic lesions noted in the
visualized portions of the skeleton.
IMPRESSION: 1. Airspace consolidation in the anteromedial aspect of the left
lower lobe, concerning for pneumonia. Standing PA and lateral chest
radiograph is recommended in 2-3 weeks to ensure resolution of this
finding.

## 2023-02-19 ENCOUNTER — Encounter: Payer: Self-pay | Admitting: Cardiovascular Disease

## 2023-03-22 ENCOUNTER — Encounter: Payer: Self-pay | Admitting: Cardiovascular Disease

## 2023-06-22 ENCOUNTER — Encounter: Payer: Self-pay | Admitting: Cardiovascular Disease

## 2023-06-30 ENCOUNTER — Other Ambulatory Visit: Payer: Self-pay | Admitting: Cardiovascular Disease

## 2023-08-27 ENCOUNTER — Ambulatory Visit (HOSPITAL_COMMUNITY): Payer: Medicare HMO | Attending: Cardiovascular Disease

## 2023-08-27 DIAGNOSIS — I5042 Chronic combined systolic (congestive) and diastolic (congestive) heart failure: Secondary | ICD-10-CM | POA: Diagnosis not present

## 2023-08-27 LAB — ECHOCARDIOGRAM COMPLETE
Area-P 1/2: 4.89 cm2
P 1/2 time: 412 ms
S' Lateral: 4.8 cm

## 2023-09-18 ENCOUNTER — Other Ambulatory Visit: Payer: Self-pay | Admitting: Cardiovascular Disease

## 2023-09-24 ENCOUNTER — Encounter: Payer: Self-pay | Admitting: Cardiovascular Disease

## 2023-09-24 ENCOUNTER — Ambulatory Visit: Payer: Medicare HMO | Attending: Cardiovascular Disease | Admitting: Cardiovascular Disease

## 2023-09-24 VITALS — BP 116/62 | HR 90 | Ht 74.0 in | Wt 144.2 lb

## 2023-09-24 DIAGNOSIS — N1831 Chronic kidney disease, stage 3a: Secondary | ICD-10-CM

## 2023-09-24 DIAGNOSIS — I493 Ventricular premature depolarization: Secondary | ICD-10-CM

## 2023-09-24 DIAGNOSIS — I4819 Other persistent atrial fibrillation: Secondary | ICD-10-CM

## 2023-09-24 DIAGNOSIS — I5042 Chronic combined systolic (congestive) and diastolic (congestive) heart failure: Secondary | ICD-10-CM

## 2023-09-24 NOTE — Patient Instructions (Signed)
Medication Instructions:   No changes   *If you need a refill on your cardiac medications before your next appointment, please call your pharmacy*   Lab Work: None    If you have labs (blood work) drawn today and your tests are completely normal, you will receive your results only by: MyChart Message (if you have MyChart) OR A paper copy in the mail If you have any lab test that is abnormal or we need to change your treatment, we will call you to review the results.   Testing/Procedures: None    Follow-Up: At [facility], you and your health needs are our priority.  As part of our continuing mission to provide you with exceptional heart care, we have created designated Provider Care Teams.  These Care Teams include your primary Cardiologist (physician) and Advanced Practice Providers (APPs -  Physician Assistants and Nurse Practitioners) who all work together to provide you with the care you need, when you need it.  We recommend signing up for the patient portal called "MyChart".  Sign up information is provided on this After Visit Summary.  MyChart is used to connect with patients for Virtual Visits (Telemedicine).  Patients are able to view lab/test results, encounter notes, upcoming appointments, etc.  Non-urgent messages can be sent to your provider as well.   To learn more about what you can do with MyChart, go to [email or web address].    Your next appointment:   1 year(s)  The format for your next appointment:   In Person  Provider:   Thurmon Fair, MD    Other Instructions

## 2023-09-25 NOTE — Progress Notes (Signed)
Cardiology Office Note:    Date:  09/25/2023   ID:  Michael Noble, DOB 11-11-1951, MRN [id number]  PCP:  Patient, No Pcp Per  [facility] Cardiologist:  Thurmon Fair, MD  [facility] Electrophysiologist:  Will Jorja Loa, MD   Referring MD: No ref. provider found   Chief Complaint  Patient presents with   Congestive Heart Failure     History of Present Illness:    Michael Noble is a 72 y.o. adult with a hx of HFrEF and persistent AFIb, presenting with RVR and LVEF 21% in October 2020.  (No angina, normal nuclear study, no obvious coronary stenoses at non-dedicated cardiac CT preablation).  Following initiation of heart failure therapy he has had a progressive improvement in left ventricular systolic function with  LVEF 35-40% by echocardiogram 04/30/2020, but with a repeat drop in EF to 25-30% by echo in 05/02/2021 during recurrent AFib/RVR.   After ablation of AFib, EF was 30-35% in February 2023.  He has not had any detected atrial fibrillation since then.  He has never been aware of the arrhythmia, but his watch has not shown any evidence of elevated heart rate.  He is in a bigeminal rhythm today.  Repeat echocardiogram in October 2023 still shows depressed LVEF, albeit slightly improved at 35-40%.  Most recent echocardiogram in October 24 shows similar findings of LVEF 35-40% with global hypokinesis.  There are no significant valvular abnormalities.  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  He does not feel held back by shortness of breath, dizziness or any cardiovascular complaints.  He does not have shortness of breath with most of his activities.  NYHA functional class II.  Denies lower extremity edema, orthopnea, PND.  He has never complained of chest pain.  He has not had syncope or dizziness.  He has not required loop diuretics in a very long time, probably none  this year.  He did not tolerate Entresto and we eventually had to also discontinue losartan due to symptomatic hypotension.  He continues to take a high dose of carvedilol.  He has not had any bleeding events on Eliquis.   He is on high-dose carvedilol.  Initial attempts to start treatment with angiotensin receptor blocker were unsuccessful due to worsening renal parameters (creatinine increased from 1.3-1.94 and he had borderline hyperkalemia) and symptomatic hypotension.  Eventually had to discontinue losartan due to symptomatic hypotension.  He has an excellent LDL cholesterol at 52 but has a low HDL 23 (last checked October 2020)  Past Medical History:  Diagnosis Date   Arthritis    Hepatitis    hx of 35 years ago     Past Surgical History:  Procedure Laterality Date   ATRIAL FIBRILLATION ABLATION N/A 08/31/2021   Procedure: ATRIAL FIBRILLATION ABLATION;  Surgeon: Regan Lemming, MD;  Location: [facility] INVASIVE CV LAB;  Service: Cardiovascular;  Laterality: N/A;   CARDIOVERSION N/A 06/16/2021   Procedure: CARDIOVERSION;  Surgeon: Chilton Si, MD;  Location: [facility] ENDOSCOPY;  Service: Cardiovascular;  Laterality: N/A;   TOTAL HIP ARTHROPLASTY Left 06/25/2015   Procedure: LEFT TOTAL HIP ARTHROPLASTY ANTERIOR APPROACH;  Surgeon: Kathryne Hitch, MD;  Location: [facility] ORS;  Service: Orthopedics;  Laterality: Left;    Current Medications: Current Meds  Medication Sig   apixaban (ELIQUIS) 5 MG TABS tablet Take 1 tablet (5 mg total) by mouth 2 (two) times daily.  carvedilol (COREG) 25 MG tablet TAKE 1 TABLET BY MOUTH TWICE DAILY WITH A MEAL   Lysine 500 MG TABS Take 500 mg by mouth daily.   magnesium 30 MG tablet Take 400 mg by mouth daily.   Misc Natural Products (URINOZINC PO) Take 1 tablet by mouth daily.   Multiple Vitamin (MULTIVITAMIN WITH MINERALS) TABS tablet Take 1 tablet by mouth daily. 50 +   Turmeric 400 MG CAPS Take 400 mg by mouth daily. With flax seed oil and fish  oil combination     Allergies:   Patient has no known allergies.   Social History   Socioeconomic History   Marital status: Married    Spouse name: Not on file   Number of children: Not on file   Years of education: Not on file   Highest education level: Not on file  Occupational History   Not on file  Tobacco Use   Smoking status: Former   Smokeless tobacco: Former    Types: Chew   Tobacco comments:    Former smoker and Chew 09/28/2021  Substance and Sexual Activity   Alcohol use: No   Drug use: No   Sexual activity: Not on file  Other Topics Concern   Not on file  Social History Narrative   Not on file   Social Determinants of Health   Financial Resource Strain: Not on file  Food Insecurity: Not on file  Transportation Needs: Not on file  Physical Activity: Not on file  Stress: Not on file  Social Connections: Not on file     Family History: The patient's family history includes Aneurysm in his father; Atrial fibrillation in his father; Heart disease in his mother.  ROS:   Please see the history of present illness.     All other systems reviewed and are negative.  EKGs/Labs/Other Studies Reviewed:    The following studies were reviewed today: Echocardiogram 08/27/2023  1. Left ventricular ejection fraction, by estimation, is 35 to 40%. The  left ventricle has moderately decreased function. The left ventricle  demonstrates global hypokinesis. Left ventricular diastolic parameters are  indeterminate.   2. Right ventricular systolic function is normal. The right ventricular  size is normal. There is normal pulmonary artery systolic pressure.   3. The mitral valve is degenerative. Mild mitral valve regurgitation. No  evidence of mitral stenosis.   4. The tricuspid valve is degenerative.   5. The aortic valve is grossly normal. There is mild calcification of the  aortic valve. There is mild thickening of the aortic valve. Aortic valve  regurgitation is mild  to moderate. Aortic valve sclerosis is present, with  no evidence of aortic valve  stenosis.   6. The inferior vena cava is normal in size with greater than 50%  respiratory variability, suggesting right atrial pressure of 3 mmHg.   Comparison(s): No significant change from prior study.   EKG:  Not ordered today  Recent Labs: No results found for requested labs within last 365 days.  Recent Lipid Panel    Component Value Date/Time   CHOL 83 09/04/2019 0223   TRIG 41 09/04/2019 0223   HDL 23 (L) 09/04/2019 0223   CHOLHDL 3.6 09/04/2019 0223   VLDL 8 09/04/2019 0223   LDLCALC 52 09/04/2019 0223    Physical Exam:    VS:  BP 116/62 (BP Location: Left Arm, Patient Position: Sitting, Cuff Size: Normal)   Pulse 90   Ht 6\' 2"  (1.88 m)   Wt 144  lb 3.2 oz (65.4 kg)   SpO2 98%   BMI 18.51 kg/m     Wt Readings from Last 3 Encounters:  09/24/23 144 lb 3.2 oz (65.4 kg)  11/23/22 153 lb 3.2 oz (69.5 kg)  07/14/22 152 lb (68.9 kg)      General: Alert, oriented x3, no distress, very lean Head: no evidence of trauma, PERRL, EOMI, no exophtalmos or lid lag, no myxedema, no xanthelasma; normal ears, nose and oropharynx Neck: normal jugular venous pulsations and no hepatojugular reflux; brisk carotid pulses without delay and no carotid bruits Chest: clear to auscultation, no signs of consolidation by percussion or palpation, normal fremitus, symmetrical and full respiratory excursions Cardiovascular: normal position and quality of the apical impulse, regular geminal rhythm, normal first and second heart sounds, no murmurs, rubs or gallops Abdomen: no tenderness or distention, no masses by palpation, no abnormal pulsatility or arterial bruits, normal bowel sounds, no hepatosplenomegaly Extremities: no clubbing, cyanosis or edema; 2+ radial, ulnar and brachial pulses bilaterally; 2+ right femoral, posterior tibial and dorsalis pedis pulses; 2+ left femoral, posterior tibial and dorsalis pedis  pulses; no subclavian or femoral bruits Neurological: grossly nonfocal Psych: Normal mood and affect     ASSESSMENT:    1. Chronic combined systolic and diastolic heart failure (HCC)   2. Persistent atrial fibrillation (HCC)   3. PVCs (premature ventricular contractions)   4. Stage 3a chronic kidney disease (HCC)        PLAN:    In order of problems listed above:  CHF: Euvolemic without diuretics, NYHA functional class I-2.  Although he did have a component of tachycardia cardiomyopathy, clearly there is another underlying reason for his heart failure.  Rate control is therefore very important.  He did not tolerate Entresto, did not tolerate even low-dose ARB and we have not tried spironolactone.  He has striking atrophy of the thenar muscles in both hands and temporal muscle atrophy, which makes me wonder about a more systemic process such as a skeletal and cardiac myopathy.  There is no family history of genetic myopathy or heart failure.    I have previously offered referral for genetic testing, but he is not interested in this.  Could use SGLT2 inhibitor, but he is already struggling with the "doughnut hole" cost of Eliquis (and did not qualify for assistance).   AFib: There is no evidence of clinical recurrence since he had his ablation.  Compliant with Eliquis.  CHA2DS2-VASc 2 (age, HF). PVCs: Has had periods of ventricular bigeminy in the past.  Regular rhythm today Anticoagulation: Denies bleeding problem CKD 3a: Do not have recent labs.  Need to see if these have been done or get an updated creatinine level.  Medication Adjustments/Labs and Tests Ordered: Current medicines are reviewed at length with the patient today.  Concerns regarding medicines are outlined above.  No orders of the defined types were placed in this encounter.   No orders of the defined types were placed in this encounter.    Patient Instructions  Medication Instructions:   No changes   *If you  need a refill on your cardiac medications before your next appointment, please call your pharmacy*   Lab Work: None    If you have labs (blood work) drawn today and your tests are completely normal, you will receive your results only by: MyChart Message (if you have MyChart) OR A paper copy in the mail If you have any lab test that is abnormal or we need to  change your treatment, we will call you to review the results.   Testing/Procedures: None    Follow-Up: At [facility], you and your health needs are our priority.  As part of our continuing mission to provide you with exceptional heart care, we have created designated Provider Care Teams.  These Care Teams include your primary Cardiologist (physician) and Advanced Practice Providers (APPs -  Physician Assistants and Nurse Practitioners) who all work together to provide you with the care you need, when you need it.  We recommend signing up for the patient portal called "MyChart".  Sign up information is provided on this After Visit Summary.  MyChart is used to connect with patients for Virtual Visits (Telemedicine).  Patients are able to view lab/test results, encounter notes, upcoming appointments, etc.  Non-urgent messages can be sent to your provider as well.   To learn more about what you can do with MyChart, go to [email or web address].    Your next appointment:   1 year(s)  The format for your next appointment:   In Person  Provider:   Thurmon Fair, MD    Other Instructions    Signed, Thurmon Fair, MD  09/25/2023 6:48 PM    [facility]

## 2023-09-27 ENCOUNTER — Telehealth: Payer: Self-pay | Admitting: Emergency Medicine

## 2023-09-27 DIAGNOSIS — I4819 Other persistent atrial fibrillation: Secondary | ICD-10-CM

## 2023-09-27 DIAGNOSIS — Z79899 Other long term (current) drug therapy: Secondary | ICD-10-CM

## 2023-09-27 NOTE — Telephone Encounter (Signed)
Croitoru, Rachelle Hora, MD  Scheryl Marten, RN Can you please ask him if he has had any recent blood work anywhere?  I am sorry I did not notice but he has not had a CBC or BMET since 2022.  To make sure that it is still safe for him to take the current dose of Eliquis we need to check those please  Called and left message with call back number. If patient has not had any recent lab work, he will need to come in to have a CBC and BMP drawn.

## 2023-09-28 NOTE — Telephone Encounter (Signed)
Went over the information from previous documentation. Pt will come in on Monday or Tuesday to get CBC and BMP.

## 2023-09-28 NOTE — Addendum Note (Signed)
Addended by: Scheryl Marten on: 09/28/2023 08:30 AM   Modules accepted: Orders

## 2023-10-01 ENCOUNTER — Other Ambulatory Visit: Payer: Self-pay

## 2023-10-01 DIAGNOSIS — I5042 Chronic combined systolic (congestive) and diastolic (congestive) heart failure: Secondary | ICD-10-CM | POA: Diagnosis not present

## 2023-10-01 DIAGNOSIS — Z79899 Other long term (current) drug therapy: Secondary | ICD-10-CM

## 2023-10-01 DIAGNOSIS — I4819 Other persistent atrial fibrillation: Secondary | ICD-10-CM

## 2023-10-02 LAB — BASIC METABOLIC PANEL
BUN/Creatinine Ratio: 29 — ABNORMAL HIGH (ref 10–24)
BUN: 29 mg/dL — ABNORMAL HIGH (ref 8–27)
CO2: 23 mmol/L (ref 20–29)
Calcium: 9.6 mg/dL (ref 8.6–10.2)
Chloride: 105 mmol/L (ref 96–106)
Creatinine, Ser: 1 mg/dL (ref 0.76–1.27)
Glucose: 86 mg/dL (ref 70–99)
Potassium: 5.4 mmol/L — ABNORMAL HIGH (ref 3.5–5.2)
Sodium: 141 mmol/L (ref 134–144)
eGFR: 81 mL/min/{1.73_m2} (ref >59)

## 2023-10-02 LAB — CBC
Hematocrit: 38.2 % (ref 37.5–51.0)
Hemoglobin: 12.5 g/dL — ABNORMAL LOW (ref 13.0–17.7)
MCH: 32.3 pg (ref 26.6–33.0)
MCHC: 32.7 g/dL (ref 31.5–35.7)
MCV: 99 fL — ABNORMAL HIGH (ref 79–97)
Platelets: 154 10*3/uL (ref 150–450)
RBC: 3.87 x10E6/uL — ABNORMAL LOW (ref 4.14–5.80)
RDW: 12 % (ref 11.6–15.4)
WBC: 4.5 10*3/uL (ref 3.4–10.8)

## 2023-10-02 LAB — BRAIN NATRIURETIC PEPTIDE: BNP: 119.8 pg/mL — ABNORMAL HIGH (ref 0.0–100.0)

## 2023-12-12 ENCOUNTER — Other Ambulatory Visit: Payer: Self-pay | Admitting: Cardiovascular Disease

## 2023-12-27 ENCOUNTER — Encounter: Payer: Self-pay | Admitting: Cardiovascular Disease

## 2023-12-27 DIAGNOSIS — R55 Syncope and collapse: Secondary | ICD-10-CM | POA: Diagnosis not present

## 2023-12-27 DIAGNOSIS — R404 Transient alteration of awareness: Secondary | ICD-10-CM | POA: Diagnosis not present

## 2024-01-19 DEATH — deceased
